# Patient Record
Sex: Female | Born: 1985 | Race: Black or African American | Hispanic: No | Marital: Single | Smoking: Current some day smoker
Health system: Southern US, Community
[De-identification: ages and names within clinical notes are randomized; demographics above are authoritative.]

## PROBLEM LIST (undated history)

## (undated) DIAGNOSIS — Z9889 Other specified postprocedural states: Secondary | ICD-10-CM

---
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
## 2004-03-09 ENCOUNTER — Other Ambulatory Visit: Admission: RE | Admit: 2004-03-09 | Discharge: 2004-03-09 | Payer: Self-pay | Admitting: Obstetrics and Gynecology

## 2004-03-17 ENCOUNTER — Inpatient Hospital Stay (HOSPITAL_COMMUNITY): Admission: AD | Admit: 2004-03-17 | Discharge: 2004-03-20 | Payer: Self-pay | Admitting: Obstetrics and Gynecology

## 2004-03-19 ENCOUNTER — Encounter (INDEPENDENT_AMBULATORY_CARE_PROVIDER_SITE_OTHER): Payer: Self-pay | Admitting: Specialist

## 2004-04-15 ENCOUNTER — Inpatient Hospital Stay (HOSPITAL_COMMUNITY): Admission: AD | Admit: 2004-04-15 | Discharge: 2004-04-15 | Payer: Self-pay | Admitting: Obstetrics and Gynecology

## 2004-04-20 ENCOUNTER — Ambulatory Visit (HOSPITAL_COMMUNITY): Admission: RE | Admit: 2004-04-20 | Discharge: 2004-04-20 | Payer: Self-pay | Admitting: Obstetrics and Gynecology

## 2004-08-09 ENCOUNTER — Emergency Department (HOSPITAL_COMMUNITY): Admission: EM | Admit: 2004-08-09 | Discharge: 2004-08-09 | Payer: Self-pay | Admitting: Family Medicine

## 2004-11-20 ENCOUNTER — Emergency Department (HOSPITAL_COMMUNITY): Admission: EM | Admit: 2004-11-20 | Discharge: 2004-11-20 | Payer: Self-pay | Admitting: Emergency Medicine

## 2004-12-20 ENCOUNTER — Inpatient Hospital Stay (HOSPITAL_COMMUNITY): Admission: AD | Admit: 2004-12-20 | Discharge: 2004-12-20 | Payer: Self-pay | Admitting: Obstetrics and Gynecology

## 2005-05-02 ENCOUNTER — Other Ambulatory Visit: Admission: RE | Admit: 2005-05-02 | Discharge: 2005-05-02 | Payer: Self-pay | Admitting: Obstetrics and Gynecology

## 2005-06-05 ENCOUNTER — Emergency Department (HOSPITAL_COMMUNITY): Admission: EM | Admit: 2005-06-05 | Discharge: 2005-06-05 | Payer: Self-pay | Admitting: Emergency Medicine

## 2005-11-11 ENCOUNTER — Emergency Department (HOSPITAL_COMMUNITY): Admission: EM | Admit: 2005-11-11 | Discharge: 2005-11-11 | Payer: Self-pay | Admitting: Emergency Medicine

## 2005-12-19 ENCOUNTER — Inpatient Hospital Stay (HOSPITAL_COMMUNITY): Admission: AD | Admit: 2005-12-19 | Discharge: 2005-12-19 | Payer: Self-pay | Admitting: Obstetrics and Gynecology

## 2005-12-28 ENCOUNTER — Other Ambulatory Visit: Admission: RE | Admit: 2005-12-28 | Discharge: 2005-12-28 | Payer: Self-pay | Admitting: Obstetrics and Gynecology

## 2006-02-07 ENCOUNTER — Emergency Department (HOSPITAL_COMMUNITY): Admission: EM | Admit: 2006-02-07 | Discharge: 2006-02-07 | Payer: Self-pay | Admitting: Family Medicine

## 2006-05-10 ENCOUNTER — Other Ambulatory Visit: Admission: RE | Admit: 2006-05-10 | Discharge: 2006-05-10 | Payer: Self-pay | Admitting: Obstetrics and Gynecology

## 2006-05-28 ENCOUNTER — Emergency Department (HOSPITAL_COMMUNITY): Admission: EM | Admit: 2006-05-28 | Discharge: 2006-05-28 | Payer: Self-pay | Admitting: Family Medicine

## 2006-06-20 ENCOUNTER — Emergency Department (HOSPITAL_COMMUNITY): Admission: EM | Admit: 2006-06-20 | Discharge: 2006-06-20 | Payer: Self-pay | Admitting: Family Medicine

## 2006-07-23 HISTORY — PX: TUBAL LIGATION: SHX77

## 2006-10-04 ENCOUNTER — Ambulatory Visit (HOSPITAL_COMMUNITY): Admission: RE | Admit: 2006-10-04 | Discharge: 2006-10-04 | Payer: Self-pay | Admitting: Emergency Medicine

## 2006-11-27 ENCOUNTER — Inpatient Hospital Stay (HOSPITAL_COMMUNITY): Admission: AD | Admit: 2006-11-27 | Discharge: 2006-11-27 | Payer: Self-pay | Admitting: Gynecology

## 2006-11-27 ENCOUNTER — Ambulatory Visit: Payer: Self-pay | Admitting: Obstetrics and Gynecology

## 2006-12-11 ENCOUNTER — Ambulatory Visit: Payer: Self-pay | Admitting: Obstetrics & Gynecology

## 2006-12-11 ENCOUNTER — Ambulatory Visit (HOSPITAL_COMMUNITY): Admission: RE | Admit: 2006-12-11 | Discharge: 2006-12-11 | Payer: Self-pay | Admitting: Family Medicine

## 2006-12-11 ENCOUNTER — Encounter: Payer: Self-pay | Admitting: Obstetrics and Gynecology

## 2006-12-29 ENCOUNTER — Ambulatory Visit: Payer: Self-pay | Admitting: *Deleted

## 2006-12-29 ENCOUNTER — Inpatient Hospital Stay (HOSPITAL_COMMUNITY): Admission: AD | Admit: 2006-12-29 | Discharge: 2006-12-29 | Payer: Self-pay | Admitting: Obstetrics and Gynecology

## 2006-12-30 ENCOUNTER — Ambulatory Visit: Payer: Self-pay | Admitting: *Deleted

## 2006-12-30 ENCOUNTER — Other Ambulatory Visit: Payer: Self-pay | Admitting: Emergency Medicine

## 2006-12-30 ENCOUNTER — Inpatient Hospital Stay (HOSPITAL_COMMUNITY): Admission: AD | Admit: 2006-12-30 | Discharge: 2007-01-07 | Payer: Self-pay | Admitting: Obstetrics and Gynecology

## 2007-01-15 ENCOUNTER — Ambulatory Visit: Payer: Self-pay | Admitting: Obstetrics & Gynecology

## 2007-01-23 ENCOUNTER — Ambulatory Visit: Payer: Self-pay | Admitting: Gynecology

## 2007-01-24 ENCOUNTER — Inpatient Hospital Stay (HOSPITAL_COMMUNITY): Admission: AD | Admit: 2007-01-24 | Discharge: 2007-01-25 | Payer: Self-pay | Admitting: Obstetrics & Gynecology

## 2007-01-24 ENCOUNTER — Ambulatory Visit: Payer: Self-pay | Admitting: Physician Assistant

## 2007-01-26 ENCOUNTER — Inpatient Hospital Stay (HOSPITAL_COMMUNITY): Admission: AD | Admit: 2007-01-26 | Discharge: 2007-01-26 | Payer: Self-pay | Admitting: Obstetrics & Gynecology

## 2007-01-29 ENCOUNTER — Ambulatory Visit: Payer: Self-pay | Admitting: Obstetrics & Gynecology

## 2007-02-19 ENCOUNTER — Ambulatory Visit: Payer: Self-pay | Admitting: Obstetrics & Gynecology

## 2007-03-13 ENCOUNTER — Inpatient Hospital Stay (HOSPITAL_COMMUNITY): Admission: AD | Admit: 2007-03-13 | Discharge: 2007-03-13 | Payer: Self-pay | Admitting: Obstetrics and Gynecology

## 2007-03-13 ENCOUNTER — Ambulatory Visit: Payer: Self-pay | Admitting: Obstetrics and Gynecology

## 2007-04-02 ENCOUNTER — Ambulatory Visit: Payer: Self-pay | Admitting: *Deleted

## 2007-04-05 ENCOUNTER — Inpatient Hospital Stay (HOSPITAL_COMMUNITY): Admission: AD | Admit: 2007-04-05 | Discharge: 2007-04-07 | Payer: Self-pay | Admitting: Gynecology

## 2007-04-05 ENCOUNTER — Ambulatory Visit: Payer: Self-pay | Admitting: Gynecology

## 2007-12-31 ENCOUNTER — Emergency Department (HOSPITAL_COMMUNITY): Admission: EM | Admit: 2007-12-31 | Discharge: 2007-12-31 | Payer: Self-pay | Admitting: Emergency Medicine

## 2008-03-31 ENCOUNTER — Ambulatory Visit: Payer: Self-pay | Admitting: Obstetrics and Gynecology

## 2008-03-31 ENCOUNTER — Encounter: Payer: Self-pay | Admitting: Obstetrics and Gynecology

## 2008-06-28 ENCOUNTER — Emergency Department (HOSPITAL_COMMUNITY): Admission: EM | Admit: 2008-06-28 | Discharge: 2008-06-28 | Payer: Self-pay | Admitting: Family Medicine

## 2009-04-14 ENCOUNTER — Emergency Department (HOSPITAL_COMMUNITY): Admission: EM | Admit: 2009-04-14 | Discharge: 2009-04-14 | Payer: Self-pay | Admitting: Family Medicine

## 2009-09-30 ENCOUNTER — Emergency Department (HOSPITAL_COMMUNITY): Admission: EM | Admit: 2009-09-30 | Discharge: 2009-09-30 | Payer: Self-pay | Admitting: Family Medicine

## 2009-12-20 ENCOUNTER — Emergency Department (HOSPITAL_COMMUNITY): Admission: EM | Admit: 2009-12-20 | Discharge: 2009-12-20 | Payer: Self-pay | Admitting: Family Medicine

## 2010-08-13 ENCOUNTER — Encounter: Payer: Self-pay | Admitting: Emergency Medicine

## 2010-10-09 LAB — POCT URINALYSIS DIP (DEVICE)
Glucose, UA: NEGATIVE mg/dL
Nitrite: NEGATIVE
Protein, ur: 100 mg/dL — AB
Specific Gravity, Urine: 1.025 (ref 1.005–1.030)
Urobilinogen, UA: 2 mg/dL — ABNORMAL HIGH (ref 0.0–1.0)
pH: 7 (ref 5.0–8.0)

## 2010-12-05 NOTE — Op Note (Signed)
[name], [name]               ACCOUNT NO.:  [id number]   MEDICAL RECORD NO.:  [id number]           PATIENT TYPE:   LOCATION:                                [facility]:  [facility]   PHYSICIAN:  Allie Bossier, MD        DATE OF BIRTH:  1985/09/09   DATE OF PROCEDURE:  04/06/2007  DATE OF DISCHARGE:                               OPERATIVE REPORT   PREOPERATIVE DIAGNOSIS:  Multiparity, desires sterility.   POSTOPERATIVE DIAGNOSIS:  Multiparity, desires sterility.   PROCEDURE:  Application of Filshie clips to oviducts.   SURGEON:  Clarisa Kindred, MD   ANESTHESIA:  Epidural, Cristela Blue, MD   COMPLICATIONS:  None.   ESTIMATED BLOOD LOSS:  Minimal.   SPECIMENS:  None.   DETAILED PROCEDURE AND FINDINGS:  The risks, benefits, alternatives of  surgery were explained, understood, accepted.  Consents were signed.  She understands the 1% failure rate of the surgery and wishes to  proceed.  In the operating room her epidural was bolused for surgery.  Her abdomen was prepped and draped usual sterile fashion.  Adequate  anesthesia was assured.  Approximately 4 mL of 0.5% Marcaine was  injected in the umbilicus and I took care to look and see a natural  crease in her umbilicus and made an incision at this site.  The fascia  was elevated with Kocher clamps and incised with Mayo scissors.  The  peritoneum was entered with hemostats.  Using [employer] retractors the  oviduct on her right was visualized.  It was grasped with Babcock clamp  and traced to the fimbriated end.  It was then retraced to the isthmic  region where a Filshie clip was placed across the entire oviduct  approximately 1 mL of 0.5% Marcaine was injected into the oviduct near  the clip for postop pain relief.  A repeat procedure was performed on  the left side again the fimbriated ends were visualized and the clip was  placed in the isthmic region and 1 mL of 0.5% Marcaine was injected into  the tube.  The tubes were allowed to fall  back in the abdominal cavity.  The fascia was elevated with Allis clamps and closed with a 0 Vicryl  running nonlocking suture.  A subcuticular closure was done with 4-0  Vicryl suture.  Instrument, sponge and counts were correct.  She  tolerated the procedure well and was taken to recovery room in stable  condition with the instrument, sponge and needle counts correct.      Allie Bossier, MD  Electronically Signed    MCD/MEDQ  D:  04/06/2007  T:  04/07/2007  Job:  [id number]

## 2010-12-05 NOTE — Group Therapy Note (Signed)
NAMEJOBETH, PANGILINAN NO.:  [id number]   MEDICAL RECORD NO.:  [id number]          PATIENT TYPE:  WOC   LOCATION:  [facility]                   FACILITY:  [facility]   PHYSICIAN:  Argentina Donovan, MD        DATE OF BIRTH:  03-04-1986   DATE OF SERVICE:  03/31/2008                                  CLINIC NOTE   The patient is a 25 year old African American female, gravida 4, para 2-  2-0-3 who had her last Pap smear a year ago that turned out to be a high-  grade squamous intraepithelial lesion.  The patient was contacted  several times to come back after that visit and was sent several  certified letters, but never came back until today.   PHYSICAL EXAMINATION:  EXTERNAL GENITALIA:  Normal.  The introitus was  marital.  The BUS was normal.  Vagina is clean and well rugated.  The  cervix was parous with a marked amount of inflammation especially on the  anterior lip.  The uterus was of normal size, shape, and consistency and  adnexa was normal.  ABDOMEN:  Soft, flat, and nontender.  No masses or organomegaly.   The Pap smear will be sent for evaluation of gonorrhea and chlamydia as  well as cytology.  I told the patient that it is most likely she is  going to end up needing a colposcopy and was described in detail what  that meant.  She is a smoker, smokes more than a pack of cigarettes a  day, although she says she is trying to stop.   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  Ending Pap smear and probably  needs colposcopy.            ______________________________  Argentina Donovan, MD     PR/MEDQ  D:  03/31/2008  T:  04/01/2008  Job:  [id number]

## 2010-12-05 NOTE — Discharge Summary (Signed)
[name], [name]               ACCOUNT NO.:  [id number]   MEDICAL RECORD NO.:  [id number]          PATIENT TYPE:  INP   LOCATION:  9305                          [facility]ILITY:  [facility]   PHYSICIAN:  Phil D. Okey Dupre, M.D.     DATE OF BIRTH:  [birth year]   DATE OF ADMISSION:  12/30/2006  DATE OF DISCHARGE:  01/07/2007                               DISCHARGE SUMMARY   ADMISSION DIAGNOSES:  1. Intrauterine pregnancy at 25+ weeks' gestation.  2. Hyperemesis in pregnancy.   DISCHARGE DIAGNOSES:  1. Intrauterine pregnancy at __________ weeks.  2. Hyperemesis.  3. Hypopotassemia.  4. Constipation.  5. Anemia/  6. Hypophosphatemia.   PERTINENT LABS:  Urinalysis on admission:  Specific gravity 1.030,  greater than 80 of ketones, 30 of protein.  Wet prep was negative.  H.  pylori antibody was negative.  Prealbumin was 13.1, which was low.  Ferritin was normal at 33.  TSH was 0.356, which was normal.  Liver  function tests were normal.  Amylase was mildly elevated at 156.  Lipase  was normal at 19.  Potassium was 2.8, which was repleted, and discharge  potassium was 4.4.  Hemoglobin was 8.8 on admission and at the time of  discharge, hemoglobin was 9.6.  Phosphorus was 1.5 on early admission,  which got repleted and was 4.7 at the time of discharge.   STUDIES:  1. OB ultrasound done June 14 showed baby in cephalic position, AFI      normal, biophysical profile of 8/8, cervix 4 cm.  2. Abdominal ultrasound done June 11 showed no evidence of gallstones      or acute cholecystitis.   CONSULTATIONS:  1. Gastroenterology.  2. Nutrition.   HOSPITAL COURSE:  This is a 25 year old gravida 4, para 1-2-0-2, at 25  weeks 2 days' gestation, who presented with complaints of severe nausea  and vomiting x3 days.  The patient reports unable to hold anything down  for 2 days prior to admission.  Had several MAU visits in the 2 days  prior to admission.  She does have a history of nausea/vomiting, early  in  pregnancy but for the 3 weeks prior to admission she had been  completely fine with sudden onset of symptoms once again a couple of  days prior to admission.  On admission she was found to be very  dehydrated.  She was admitted for IV hydration and for the hyperemesis.  she was given several for fluid boluses, continued on IV fluids, was  placed on standing antiemetic regimen which included Zofran, Phenergan  and Reglan, and was kept n.p.o. for these 24 hours.  The patient still  continued with profuse nausea and vomiting despite standing antiemetics.  She was started on a steroid taper and a proton pump inhibitor, namely  Protonix, was added to her regimen.  TSH H. pylori was done, which were  both negative.  While here in the hospital the patient complained of  abdominal pain, primarily epigastric and some lower abdominal pain.  There was concern with the nausea and vomiting whether the patient may  have  had gallstones so an abdominal ultrasound was done, which showed no  evidence of gallstones or biliary disease.  Her liver function tests  were normal.  Amylase was mildly elevated but it unclear whether or not  the patient was spitting a lot, which could cause an increase in  amylase.  Lipase was negative.  She was placed on the monitor given that  she was complaining of abdominal pain.  There were no contractions noted  on the monitor.  Her cervix was checked and found to be stable at 1 cm  dilated, long, and fetal position was high.  After a couple days in the  hospital with no significant improvement in the patient's clinical  picture, GI was consulted.  They saw and evaluated the patient but had  nothing further to add to her regimen or her management.  The patient  was continued on the IV fluids, antiemetics, and Unisom 12.5 mg b.i.d.  was added including a scopolamine patch.  During the hospital stay the  patient required getting a more suitable IV access because her blood  draw  sticks were becoming increasingly difficult and given that the  patient remained not tolerating much p.o., IV access was  essential.  A  PICC line was placed and shortly thereafter, TPN was started.  The  patient gradually started feeling a little bit better.  Her emesis  started slowing down.  She got to the point where she was feeling very  hungry and had her family members bring her food from the outside, which  included things like [employer] and [employer], which per the patient  report she tolerated well.  At this point her TPN was discontinued and  she was felt to be stable for discharge home with follow-up by home  health nurse for IV hydration therapy.   She was discharged on the following medications:  1. Unisom 12.5 mg p.o. b.i.d.  2. Scopolamine patch to be changed q.72h.  3. Protonix 40 mg p.o. daily.  4. Phenergan 25 mg q.6h. as needed for nausea.  5. Zofran 8 mg q.8h. as needed for nausea.  6. Reglan 10 mg p.o. q.6h.  7. Steroid taper.   Her follow-up instructions were to follow up at the low-risk clinic.  An  appointment was made to her prior to discharge for June 27.  She is to  follow a low-fat diet and to increase her protein intake.     ______________________________  [name], MD      Phil D. Okey Dupre, M.D.  Electronically Signed    LNJ/MEDQ  D:  03/07/2007  T:  03/08/2007  Job:  [id number]

## 2010-12-05 NOTE — Consult Note (Signed)
[name], [name]               ACCOUNT NO.:  [id number]   MEDICAL RECORD NO.:  [id number]          PATIENT TYPE:  INP   LOCATION:  9305                          [facility]   PHYSICIAN:  [name], M.D.   DATE OF BIRTH:  01-17-1986   DATE OF CONSULTATION:  01/01/2007  DATE OF DISCHARGE:                                 CONSULTATION   We were asked to see [name] today in consult for hyperemesis by Dr.  Argentina [name]. Today's date is January 01, 2007.   HISTORY OF PRESENT ILLNESS:  This is a 25 year old female who is 6  months pregnant.  She reports vomiting that started this past Sunday.  She states that she was vomiting blood and bowel.  She describes it as  green-brown fluid. She is negative for a bowel movement in the least 6  days.  She cannot tell me when her last bowel movement was. She denies  melena, hematochezia.  She is negative for any abdominal pain but says  that she has soreness due to vomiting.  She is not taking NSAIDs and has  had no new medications prior to admission. She states that she has no  previous history of these symptoms with previous pregnancies or other GI  ailments such as GERD or any bowel disease. She states that only  Phenergan helps her current symptoms.  The patient is not very  communicative at this point as she feels very ill.   PAST MEDICAL HISTORY:  Significant for two vaginal births. No surgeries.  No diabetes or hypertension or other adult illnesses.   CURRENT MEDICATIONS:  Are none including no prenatal vitamins.   ALLERGIES:  No known drug allergies.   FAMILY HISTORY:  Is unknown.   SOCIAL HISTORY:  Positive for tobacco.  She has a history of drug use,  does not drink alcohol.   PHYSICAL EXAM:  GENERAL:  She is awake and appropriate but appears  tired, very nauseated and ill feeling.  HEART:  Regular rate and rhythm.  LUNGS:  Clear.  ABDOMEN:  Soft, nontender with no appreciable bowel sounds.  No  appreciable  hepatomegaly.  RECTAL:  She has no hemorrhoids.  No stool in her rectal vault. The  small sample of light brown fluid that was collected was guaiac  negative.  Her eyes are anicteric. Her skin is without rash or lesions.  VITAL SIGNS:  Temperature 98.3, pulse 84, respirations are 20, blood  pressure is 117/63.  Her H.  Pylori antibody is negative.  Potassium is  currently 3.4, white count is 3.2, hemoglobin 9.2, hematocrit 27.3,  platelets are 165,000, lipase of 19, amylase of 156.  TSH of 0.356. On  wet prep the patient had many bacteria seen, some white blood cells  seen, small amount of bilirubin in her urine. Ultrasound of her abdomen  was negative for gallstones or cholecystitis.   ASSESSMENT:  Dr. Molly Maduro [name] has seen the patient, examined her, and  collected a history. He notes that:  1. She is hypokalemic.  2. She is likely constipated.   Differential diagnosis for  severe vomiting in this case could include  subclinical hyperthyroidism, abnormal gastric motility, conversion or  somatization. In light of the normal lipase and elevated amylase is less  concerning for pancreatitis given normal blood pressure, normal LFTs,  not highly suspicious of preeclampsia or HELLP syndrome. Unfortunately  we can not add any further diagnostic or therapeutic recommendations. We  will refer to customary approach for TNA. If the patient can tolerate  clear liquids MiraLax may help with her irregular bowel movements.  Thank you very much for this consultation.      Nichole Police, PA    ______________________________  [name], M.D.    MLY/MEDQ  D:  01/01/2007  T:  01/01/2007  Job:  [id number]   cc:   [name], M.D.

## 2010-12-08 NOTE — H&P (Signed)
[name], [name]                         ACCOUNT NO.:  [id number]   MEDICAL RECORD NO.:  [id number]                   PATIENT TYPE:  INP   LOCATION:  9158                                 [facility]ILITY:  [facility]   PHYSICIAN:  Hal Morales, M.D.             DATE OF BIRTH:  1986/06/06   DATE OF ADMISSION:  03/17/2004  DATE OF DISCHARGE:                                HISTORY & PHYSICAL   HISTORY OF PRESENT ILLNESS:  This is an 25 year old, gravida 3, para 1-1-0-  2, at 48 and 2/7ths weeks, who presents with complaints of gush of fluid at  1600 hours.  She denies bleeding or pain.  She was seen for new OB 2 weeks  ago, with an ultrasound about 1 weeks ago.  Records are currently  unavailable.   OBSTETRICAL HISTORY:  The patient had a 36 week vaginal delivery with her  first baby, remarkable for preterm labor.  She had a term delivery of her  second baby with no preterm labor per patient report.   MEDICAL HISTORY:  Unremarkable.   ALLERGIES:  None.   SURGICAL HISTORY:  None.   FAMILY HISTORY:  Noncontributory.   GENETIC HISTORY:  Negative.   SOCIAL HISTORY:  The patient is accompanied by her boyfriend and her mother,  who are both involved and supportive.  She does not report a religious  affiliation.  She denies any alcohol, tobacco, or drug use.   PRENATAL LABORATORIES:  Unavailable.   OBJECTIVE DATA:  VITAL SIGNS:  Stable.  Temperature 98.6.  HEENT:  Within normal limits.  Thyroid normal, not enlarged.  CHEST:  Clear to auscultation.  HEART:  Regular rate and rhythm.  ABDOMEN:  Gravid at 20 cm.  Fetal heart tones 147.  PELVIC:  Sterile speculum exam reveals positive pooling, positive nitrazine,  positive fern.  Wet prep shows rare WBC's and rare bacteria, but no clue and  no yeast.  GC and Chlamydia are pending.  Group B strep is pending.  The  cervix is closed, long, -3.  CBC is pending.  UA is pending.   ASSESSMENT:  1. Intrauterine pregnancy at 20 and 2/7ths weeks.  2. Preterm premature rupture of membranes.   PLAN:  1. Per Dr. Pennie Rushing, admit to antenatal.  2. Complete OB ultrasound.  3. Issues were discussed with the patient, boyfriend, and mother.  I briefly     outlined her options.  The patient expresses desire to try to keep the     baby as long as long as possible.  Dr. Pennie Rushing will see her later for     further discussion.     Marie L. Williams, C.N.M.                 Hal Morales, M.D.    MLW/MEDQ  D:  03/17/2004  T:  03/18/2004  Job:  [id number]

## 2010-12-08 NOTE — H&P (Signed)
[facility]  Patient:    [name], [name]                      MRN: [id number] Adm. Date:  [id number] Attending:  Shaune Spittle Dictator:   Vance Gather Duplantis, C.N.M.                         History and Physical  REDICTATION  HISTORY OF PRESENT ILLNESS:   [name] is a 25 year old single black female, gravida 1, para 0, at 36-3/7 weeks by LMP and 37-1/7 by 18-week ultrasound, who presents complaining of uterine contractions of increasing intensity every 3 minutes throughout the night.  She also reports that her nausea and vomiting that she was treated for yesterday evening with IV fluids has returned and she has continued to vomit throughout the night.  Her cervix yesterday evening was 4 cm, 95%, vertex -1 with intact membrane.  She denies any headache or visual disturbances.  She is requesting something for pain, either IV or epidural. Her pregnancy has been followed at [facility] by the certified nurse midwife service and has been at risk for:  #1 - Being an adolescent, and #2 - prolonged prodromal labor and previous preterm uterine contractions.  OBSTETRICAL/GYNECOLOGICAL HISTORY:  She is a gravida 1, para 0 with an LMP of May 07, 2000.  She has no other GYN issues.  ALLERGIES:                    She has no known drug allergies.  GENERAL MEDICAL HISTORY:      She reports having had the usual childhood diseases.  She reports a history of asthma as a child.  FAMILY HISTORY:               Her family history is significant for maternal grandmother with hypertension, on medication, sister with asthma, family history of diabetes and family history of alcoholism and smoking.  GENETIC HISTORY:              Benign.  SOCIAL HISTORY:               She is single.  She lives with her mom, who is involved and supportive, and the father of the baby is Mitchel Honour, who is also involved and supportive.  They are of the [employer]. They deny any illicit drug use, alcohol or smoking with this pregnancy.  PRENATAL LABORATORY DATA:     Her blood type is O-positive.  Her antibody screen is negative.  Sickle cell trait is negative.  Syphilis is nonreactive. Rubella is immune.  Hepatitis B surface antigen is negative.  HIV is nonreactive.  GI and Chlamydia are both negative.  Pap was within normal limits.  One-hour glucola was within normal range and maternal serum alpha-fetoprotein was also within normal range and her 36-week beta strep was negative.  PHYSICAL EXAMINATION:  VITAL SIGNS:                  Her vital signs are stable with a temperature of 100.7.  HEENT:                        Grossly within normal limits.  HEART:                        Regular rhythm and  rate.  CHEST:                        Clear.  BREASTS:                      Soft and nontender.  ABDOMEN:                      Gravid with uterine contractions noted every two to three minutes.  Her fetal heart rate is in the 150s to 155s, initially with no significant accelerations and some possible late decelerations but currently reactive and reassuring.  PELVIC:                       Her cervix on admission was 6 cm, 100%, vertex -1 with bulging membranes.  EXTREMITIES:                  Within normal limits.  ASSESSMENT:                   1. Intrauterine pregnancy at 36-3/7 weeks.                               2. Active labor.                               3. Maternal temperature.                               4. Desiring epidural.  PLAN:                         Her plan is to admit to labor and delivery per consult with Dr. Janine Limbo, to give her IV fluid bolus, give her penicillin for her temperature, to plan epidural and then AROM and watch her fetal heart rate closely. DD:  01/17/01 TD:  01/17/01 Job: [id number]

## 2010-12-08 NOTE — H&P (Signed)
[facility]  Patient:    [name], [name]                      MRN: [id number] Adm. Date:  [id number] Attending:  Shaune Spittle Dictator:   Nigel Bridgeman, C.N.M.                         History and Physical  HISTORY OF PRESENT ILLNESS:   [name] is a 25 year old gravida 1, para 0 at 35-2/7 weeks who presented with increased uterine contractions and vomiting since 5 p.m.  She was seen in Maternity Admissions Unit earlier today with cervix 2 to 3 cm, frequent uterine contractions and dehydration and vomiting. She received one bag of IV fluid and terbutaline subcu with decreased uterine contractions, at which time she was discharged home.  She reports the contractions began again approximately 5 p.m., with two episodes of vomiting since.  Pregnancy has been remarkable for:  #1 - Age 25, #2 - previous smoker, #3 - mild anemia, #4 - oligohydramnios with increased fluid on followup ultrasound last week.  PRENATAL LABORATORY DATA:     Blood type is O-positive.  Rh-antibody negative. VDRL nonreactive.  Rubella titer positive.  Hepatitis B surface antigen negative.  HIV nonreactive.  Sickle cell test negative.  GC and Chlamydia cultures were negative.  Pap was normal.  Glucose challenge was normal.  AFP was normal.  Hemoglobin upon entry into practice was 12.3; it was 10.8 at 26 weeks.  EDC of February 11, 2001 was established by last menstrual period and was in agreement with ultrasound at approximately 18 weeks.  HISTORY OF PRESENT PREGNANCY:  Patient entered care at 10 weeks.  She had low weight gain during her pregnancy.  She had some nausea and vomiting in early pregnancy and she was treated with IV fluids.  She had an ultrasound at approximately 32 weeks that showed decreased fluid.  She had a followup ultrasound last week which showed normal fluid and normal growth.  OBSTETRICAL HISTORY:          Patient is a primigravida.  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  She has a history of migraines.  ALLERGIES:                    None.  FAMILY HISTORY:               Her mother had a miscarriage.  Her maternal grandmother is hypertensive, on medication.  Her sister has asthma.  There is a family history of diabetes on her mothers side.  There is a family history of alcoholism and smoking.  GENETIC HISTORY:              Remarkable for the maternal second cousin with sickle cell disease.  SOCIAL HISTORY:               Patient is single.  The father of the baby is involved and supportive.  His name is [name].  Patient also is supported by her mother, who is also present with her.  She is African-American and of the [employer].  She is an eighth Tax adviser.  Her partner has an 11th grade education.  He is employed at a [employer].  She has been followed by  the certified nurse midwife service at [facility].  She denies any alcohol or drug use during this pregnancy.  She was a smoker but stopped with her positive UPT.  PHYSICAL EXAMINATION:  VITAL SIGNS:                  Stable.  Patient is afebrile.  HEENT:                        Within normal limits.  LUNGS:                        Bilateral breath sounds are clear.  HEART:                        Regular rate and rhythm without murmur.  BREASTS:                      Soft and nontender.  ABDOMEN:                      Fundal height is approximately 36 cm.  Estimated fetal weight is 5 to 5-1/2 pounds.  Uterine contractions are every three to four minutes, moderate quality.  PELVIC:                       Cervical exam 5 cm, 100%, vertex at a -1 to a 0 station with slightly bulging bag of water.  Cervix is slightly posterior. Fetal heart rate is reactive with no decelerations.  There is a negative spontaneous CST.  GU:                           Clean-catch urine shows 40 of  ketones, small hemoglobin, trace leukocyte esterase.  EXTREMITIES:                  Deep tendon reflexes are 2+ without clonus. There is no significant edema.  IMPRESSION:                   1. Intrauterine pregnancy at 35-2/7 weeks.                               2. Early labor.                               3. Age 25.  PLAN:                         1. Admit to birthing suite per consult with                                  Dr. Maris Berger. Haygood as attending                                  physician.                               2. Routine certified nurse midwife orders.  3. Will defer any tocolysis and will manage as                                  labor.                               4. Plan for group B strep prophylaxis with                                  penicillin G per standard dosing.                               5. Anticipate Stadol for pain medication or an                                  epidural. DD:  01/10/01 TD:  01/10/01 Job: [id number]

## 2010-12-08 NOTE — H&P (Signed)
[facility]  Patient:    [name], [name]                      MRN: [id number] Adm. Date:  [id number] Attending:  Shaune Spittle Dictator:   Vance Gather Duplantis, C.N.M.                         History and Physical  HISTORY OF PRESENT ILLNESS:   [name] is a 25 year old single black female, gravida 1, para 0, at 36-3/7 weeks by LMP and 37-1/7 weeks by ultrasound, who presents complaining of uterine contractions of increasing intensity every three minutes and return of nausea and vomiting throughout the night.  She was evaluated yesterday evening in Maternity Admissions and received two bags of IV fluids and did have her nausea and vomiting at that time improve and went home, having taken an Ambien for rest.  She was contracting at that time every three to five minutes and her cervix was still 4 cm, 95%, vertex at a -1 and intact membranes.  She currently denies any headache of visual disturbances. She is requesting something for labor pain, either IV or epidural.  Her pregnancy has been followed at [facility] by the certified nurse midwife service and has been at risk for:  #1 - History of being an adolescent and #2 - prolonged prodromal labor with preterm uterine contractions.  OBSTETRICAL/GYNECOLOGICAL HISTORY:  She is a primigravida with a certain LMP of May 07, 2000, giving her an [facility] of February 11, 2001, confirmed by 18-week ultrasound.  She has no other gynecological problems.  ALLERGIES:                    She has no known drug allergies.  GENERAL MEDICAL HISTORY:      She reports having had the usual childhood diseases.  She reports a history of asthma as a child.  GENETIC HISTORY:              Her genetic history is significant only for a maternal cousin with sickle cell disease.  FAMILY HISTORY:               Family history otherwise is significant for maternal grandmother with hypertension, on medications, and  family history of diabetes on the maternal side.  SOCIAL HISTORY:               She is single.  She lives with her mom, [name], who is involved and very supportive.  The father of the baby is also involved and supportive.  She is a Physicist, medical.  The father of the baby is employed full-time at [employer].  They deny any illicit drug use, alcohol or smoking throughout this pregnancy.  PRENATAL LABORATORY DATA:     Her blood type is O-positive.  An antibody screen is negative.  Sickle cell trait is negative.  Syphilis is nonreactive. Rubella is immune.  Hepatitis B surface antigen is negative.  HIV is nonreactive.  GC and Chlamydia are both negative.  Pap is within normal limits.  Her one-hour glucola is 97 and her maternal serum alpha-fetoprotein was within normal range.  Her beta strep was collected last week but the results are currently not available.  DD:  01/17/01 TD:  01/17/01 Job: [id number]

## 2010-12-08 NOTE — Discharge Summary (Signed)
[name], [name]                         ACCOUNT NO.:  [id number]   MEDICAL RECORD NO.:  [id number]                   PATIENT TYPE:  INP   LOCATION:  9306                                 FACILITY:  [facility]   PHYSICIAN:  Janine Limbo, M.D.            DATE OF BIRTH:  14-Nov-1985   DATE OF ADMISSION:  03/17/2004  DATE OF DISCHARGE:  03/20/2004                                 DISCHARGE SUMMARY   ADMITTING DIAGNOSES:  1. Intrauterine pregnancy at 20 and two-sevenths weeks.  2. Preterm premature rupture of membranes.   DISCHARGE DIAGNOSES:  1. Intrauterine pregnancy at 20 weeks.  2. Premature prolonged rupture of membranes.  3. History of positive group B streptococcus last pregnancy.  4. Postpartum anemia.  5. Post delivery fever, controlled with antibiotics.   PROCEDURES:  1. Spontaneous vaginal birth precipitously over an intact perineum.  2. Epidural anesthesia.   HOSPITAL COURSE:  [name] is an 25 year old gravida 3 para 1-1-0-2 at 32  and two-sevenths weeks who was admitted on March 17, 2004 with spontaneous  rupture of membranes.  Cervix was closed at that time.  CBC was normal.  UA  was normal.  Fetal heart tones were 147 and rupture of membranes was  confirmed.  The patient was having minimal contractions at that time.  She  was placed on antibiotics and bedrest.  She began to have more pain the  morning of the following day.  Pain medication was given through the day,  then the patient progressed rapidly to precipitous vaginal delivery of a  nonviable female by the name of [name], weight 14.4 ounces, Apgars  were 1 and 1.  The infant did expire subsequent to delivery.  The patient  had epidural anesthesia.  Estimated blood loss was less than 500 mL.  Placenta was delivered spontaneously and intact by Dr. Pennie Rushing.  Case  management was involved subsequent to the patient's delivery.  Funeral  services were arranged with community funeral services.  By  postpartum day  #1 the patient was up ad lib.  She was very quiet, grieving appropriately.  She did have significant family support.  Her temperature was 100.6 at 2  a.m. on March 18, 2004.  Hemoglobin was 8.6, white blood cell count was  15.3.  She was placed on Unasyn and iron.  By postpartum day #2 she had been  afebrile for greater than 24 hours.  She then converted to p.o. Augmentin.  Her physical exam was within normal limits.  She was coping with her loss  with good family support.  The decision was made to discharge her home since  she had received the full benefit of her hospital stay.   DISCHARGE INSTRUCTIONS:  Per routine postpartum instructions.  Signs and  symptoms of endometritis or fever were reviewed with the patient.  Signs and  symptoms of postpartum depression were also reviewed.   DISCHARGE MEDICATIONS:  1.  Motrin 600 mg p.o. q.6h. p.r.n. pain.  2. Tylox one to two p.o. q.3-4h. p.r.n. pain.  3. Apri birth control pill one p.o. daily to start on April 02, 2004.  4. Hemocyte one p.o. daily.  5. Augmentin 500 mg one p.o. t.i.d. x6 days.  The patient had completed 1     day of therapy already.   Discharge follow-up will occur in 2-and-a-half weeks at [facility]  on April 05, 2004 for follow-up.  The patient's return to school will be  anticipated after that.     Renaldo Reel Emilee Hero, C.N.M.                   Janine Limbo, M.D.    Leeanne Mannan  D:  03/20/2004  T:  03/20/2004  Job:  [id number]

## 2010-12-08 NOTE — H&P (Signed)
[name], [name]                         ACCOUNT NO.:  [id number]   MEDICAL RECORD NO.:  [id number]                   PATIENT TYPE:  INP   LOCATION:  9164                                 FACILITY:  [facility]   PHYSICIAN:  Janine Limbo, M.D.            DATE OF BIRTH:  11-19-1985   DATE OF ADMISSION:  05/16/2003  DATE OF DISCHARGE:                                HISTORY & PHYSICAL   HISTORY OF PRESENT ILLNESS:  [name] is a 25 year old single black female,  G2, P1-0-0-1, at 50 and 3/7ths weeks who presents with regular uterine  contractions this evening.  She denies leaking, bleeding, headache, nausea,  vomiting, or visual disturbances.  Her pregnancy has been followed by the  [facility] Certified Nurse Midwife Service, and has been  remarkable for (1) history of asthma, (2) age 70, (3) social issues, (4)  questionable last menstrual period, (5) conception on OCP's, (6) group B  strep positive.   PRENATAL LABORATORIES:  Labs collected on October 05, 2002 revealed hemoglobin  of 11.7, hematocrit 35.5, platelets 242,000.  Blood type O positive,  antibody negative, RPR nonreactive, rubella immune, hepatitis B surface  antigen negative.  Pap smear within normal limits.  Gonorrhea negative,  Chlamydia negative.  Cystic fibrosis negative.  On Nov 30, 2002, her quad  screen was within normal limits.  Her one-hour Glucola on February 16, 2003 was  97, and her RPR was nonreactive at that time.  Culture of the vaginal tract  for group B strep, gonorrhea, Chlamydia, on April 15, 2003 was negative  for the gonorrhea and Chlamydia, and positive for group B strep.   HISTORY OF PRESENT PREGNANCY:  She presented for care at [facility] on  October 05, 2002 at approximately [redacted] weeks gestation.  She had pregnancy  ultrasonography at that first visit.  Second pregnancy ultrasonography at [redacted]  weeks gestation showed growth consistent with previous dating, and no other  concerns.  At  [redacted] weeks gestation, she was treated for a urinary tract  infection.  She started the [facility] at [redacted] weeks gestation.  The  rest of her prenatal care was unremarkable.   OBSTETRIC HISTORY:  She is a gravida 2, para 1-0-0-1.  In June of 2002, she  vaginally delivered a female infant at 36.[redacted] weeks gestation after five hours  of labor.  The infant weighed 6 pounds and 15 ounces.  She had an epidural  for anesthesia.  She had a fever during labor.  The infant's name is [name].   MEDICAL HISTORY:  No medication allergies.  She conceived this pregnancy on  oral contraception.  She reports having had the usual childhood illnesses.  She has a history of bronchitis-induced asthma as a child.  The patient  smokes 1-3 cigarettes a day.   FAMILY MEDICAL HISTORY:  Maternal grandmother with history of hypertension.  The patient's sister with asthma.  FAMILY HISTORY:  Diabetes on the mother's side.  Mother with a history of  migraines.  Family history of alcoholism and smoking.   GENETIC HISTORY:  Remarkable for maternal second cousin with sickle cell  disease, and father of the baby's family with questionable sickle cell  trait.   SOCIAL HISTORY:  Father of the baby is involved in the pregnancy.  He was  present at multiple prenatal visits.  The patient is of the [employer].  She is a Consulting civil engineer.  The patient had some social issues in  late December or January.  She ran away from home.  There was a death in the  family which she was very upset about.  Father of the baby is not approved  of by the mother, but may be involved with the pregnancy if he desires.  The  patient denies any alcohol or illicit drug use with the pregnancy.   OBJECTIVE DATA:  VITAL SIGNS:  Stable.  She is afebrile.  HEENT:  Grossly within normal limits.  CHEST:  Clear to auscultation.  HEART:  Regular rate and rhythm.  ABDOMEN:  Gravid and contoured with fundal height extending approximately 39  cm  above the pubic symphysis.  Fetal heart rate is reassuring with positive  accelerations and no decelerations.  Uterine contractions every three  minutes.  PELVIC:  Cervix is 4 cm, 90%, vertex, with bulging bag of water per R.N.  exam.  EXTREMITIES:  Within normal limits.   ASSESSMENT:  1. Intrauterine pregnancy at term.  2. Early active labor.  3. Group B strep positive.   PLAN:  1. Admit to birthing suites for a consult with Dr. Stefano Gaul.  2. Routine CNM orders.  3. Plan penicillin G for group B strep.  4. The patient plans epidural for labor.     Cam Hai, C.N.M.                     Janine Limbo, M.D.    KS/MEDQ  D:  05/16/2003  T:  05/16/2003  Job:  [id number]

## 2011-05-04 LAB — CBC
HCT: 31.2 — ABNORMAL LOW
Hemoglobin: 10.8 — ABNORMAL LOW
MCHC: 34.2
MCHC: 34.5
MCV: 82.9
MCV: 82.9
Platelets: 141 — ABNORMAL LOW
RBC: 3.77 — ABNORMAL LOW
RDW: 14.8 — ABNORMAL HIGH
WBC: 7.6

## 2011-05-04 LAB — RAPID URINE DRUG SCREEN, HOSP PERFORMED
Amphetamines: NOT DETECTED
Cocaine: NOT DETECTED
Opiates: NOT DETECTED

## 2011-05-07 LAB — POCT URINALYSIS DIP (DEVICE)
Glucose, UA: NEGATIVE
Ketones, ur: NEGATIVE
Nitrite: POSITIVE — AB
Operator id: 120861
Protein, ur: 30 — AB

## 2011-05-08 LAB — POCT URINALYSIS DIP (DEVICE)
Bilirubin Urine: NEGATIVE
Hgb urine dipstick: NEGATIVE
Ketones, ur: NEGATIVE
Nitrite: NEGATIVE
Nitrite: NEGATIVE
Operator id: 148111
Operator id: 159681
Specific Gravity, Urine: 1.02
Urobilinogen, UA: 0.2
Urobilinogen, UA: 0.2
pH: 6.5

## 2011-05-08 LAB — URINALYSIS, ROUTINE W REFLEX MICROSCOPIC
Glucose, UA: NEGATIVE
Hgb urine dipstick: NEGATIVE
Hgb urine dipstick: NEGATIVE
Ketones, ur: >80 — AB
Ketones, ur: >80 — AB
Nitrite: NEGATIVE
Specific Gravity, Urine: 1.025

## 2011-05-08 LAB — CBC
HCT: 31.8 — ABNORMAL LOW
Hemoglobin: 10.6 — ABNORMAL LOW
MCHC: 33.5
MCV: 84.2
Platelets: 177
RDW: 13.5
WBC: 8.7

## 2011-05-08 LAB — BASIC METABOLIC PANEL
Creatinine, Ser: 0.5
GFR calc non Af Amer: >60

## 2011-05-08 LAB — RAPID URINE DRUG SCREEN, HOSP PERFORMED
Amphetamines: NOT DETECTED
Barbiturates: NOT DETECTED
Cocaine: NOT DETECTED

## 2011-05-09 LAB — POCT URINALYSIS DIP (DEVICE)
Glucose, UA: NEGATIVE
Hgb urine dipstick: NEGATIVE
Nitrite: NEGATIVE
Urobilinogen, UA: 0.2
pH: 7

## 2011-05-09 LAB — CBC
HCT: 27.2 — ABNORMAL LOW
HCT: 28.3 — ABNORMAL LOW
Hemoglobin: 9.1 — ABNORMAL LOW
Hemoglobin: 9.6 — ABNORMAL LOW
MCHC: 33.7
MCHC: 33.9
MCV: 85
Platelets: 164
Platelets: 169
RDW: 13.7

## 2011-05-09 LAB — COMPREHENSIVE METABOLIC PANEL
ALT: 14
ALT: 15
AST: 14
Alkaline Phosphatase: 49
BUN: 5 — ABNORMAL LOW
CO2: 23
CO2: 26
Calcium: 7.7 — ABNORMAL LOW
Calcium: 8.3 — ABNORMAL LOW
Chloride: 105
Creatinine, Ser: 0.46
GFR calc Af Amer: >60
GFR calc non Af Amer: >60
GFR calc non Af Amer: >60
Glucose, Bld: 106 — ABNORMAL HIGH
Glucose, Bld: 95
Sodium: 133 — ABNORMAL LOW
Sodium: 136
Total Bilirubin: 0.3
Total Protein: 5.4 — ABNORMAL LOW

## 2011-05-09 LAB — DIFFERENTIAL
Eosinophils Absolute: 0.1
Lymphs Abs: 0.8
Monocytes Relative: 8
Neutro Abs: 7.6
Neutrophils Relative %: 82 — ABNORMAL HIGH

## 2011-05-09 LAB — TRIGLYCERIDES
Triglycerides: 103
Triglycerides: 86

## 2011-05-09 LAB — MAGNESIUM
Magnesium: 1.7
Magnesium: 1.7

## 2011-05-09 LAB — PHOSPHORUS: Phosphorus: 4.7 — ABNORMAL HIGH

## 2011-05-10 LAB — CBC
HCT: 26.2 — ABNORMAL LOW
HCT: 34 — ABNORMAL LOW
Hemoglobin: 11.2 — ABNORMAL LOW
Hemoglobin: 12.4
Hemoglobin: 8.8 — ABNORMAL LOW
MCHC: 33.5
MCHC: 33.5
MCHC: 33.7
MCV: 84.7
MCV: 85.3
RBC: 3.07 — ABNORMAL LOW
RBC: 3.21 — ABNORMAL LOW
RBC: 3.44 — ABNORMAL LOW
RBC: 3.98
RDW: 13.7
RDW: 13.9
WBC: 13.3 — ABNORMAL HIGH
WBC: 8.8

## 2011-05-10 LAB — URINALYSIS, ROUTINE W REFLEX MICROSCOPIC
Bilirubin Urine: NEGATIVE
Glucose, UA: NEGATIVE
Hgb urine dipstick: NEGATIVE
Ketones, ur: >80 — AB
Leukocytes, UA: NEGATIVE
Nitrite: NEGATIVE
Protein, ur: 30 — AB
Protein, ur: 30 — AB
Urobilinogen, UA: 1

## 2011-05-10 LAB — COMPREHENSIVE METABOLIC PANEL
ALT: 14
ALT: 16
AST: 19
Albumin: 3.3 — ABNORMAL LOW
Alkaline Phosphatase: 61
BUN: 1 — ABNORMAL LOW
BUN: 5 — ABNORMAL LOW
CO2: 20
CO2: 21
CO2: 21
CO2: 22
Calcium: 8 — ABNORMAL LOW
Calcium: 8.1 — ABNORMAL LOW
Chloride: 106
Chloride: 108
Creatinine, Ser: 0.42
GFR calc Af Amer: >60
GFR calc Af Amer: >60
GFR calc non Af Amer: >60
GFR calc non Af Amer: >60
GFR calc non Af Amer: >60
GFR calc non Af Amer: >60
Glucose, Bld: 105 — ABNORMAL HIGH
Glucose, Bld: 112 — ABNORMAL HIGH
Glucose, Bld: 112 — ABNORMAL HIGH
Potassium: 3.5
Sodium: 133 — ABNORMAL LOW
Sodium: 135
Total Bilirubin: 0.7
Total Bilirubin: 0.8

## 2011-05-10 LAB — T4, FREE: Free T4: 1.07

## 2011-05-10 LAB — URINE MICROSCOPIC-ADD ON

## 2011-05-10 LAB — T3, FREE: T3, Free: 2.2 — ABNORMAL LOW (ref 2.3–4.2)

## 2011-05-10 LAB — I-STAT 8, (EC8 V) (CONVERTED LAB)
Acid-base deficit: 3 — ABNORMAL HIGH
BUN: 6
Chloride: 109
Glucose, Bld: 103 — ABNORMAL HIGH
pCO2, Ven: 16.3 — ABNORMAL LOW
pH, Ven: 7.606

## 2011-05-10 LAB — BASIC METABOLIC PANEL
BUN: 2 — ABNORMAL LOW
Calcium: 7.9 — ABNORMAL LOW
Chloride: 106
Chloride: 109
Creatinine, Ser: 0.41
Creatinine, Ser: 0.51
GFR calc Af Amer: >60

## 2011-05-10 LAB — POCT I-STAT CREATININE: Creatinine, Ser: 0.8

## 2011-05-10 LAB — MAGNESIUM
Magnesium: 1.5
Magnesium: 1.6

## 2011-05-10 LAB — FERRITIN: Ferritin: 33 (ref 10–291)

## 2011-05-10 LAB — POTASSIUM: Potassium: 3.4 — ABNORMAL LOW

## 2011-05-10 LAB — LIPASE, BLOOD
Lipase: 17
Lipase: 19

## 2011-05-10 LAB — AMYLASE: Amylase: 156 — ABNORMAL HIGH

## 2011-05-10 LAB — WET PREP, GENITAL: Yeast Wet Prep HPF POC: NONE SEEN

## 2011-05-24 ENCOUNTER — Inpatient Hospital Stay (INDEPENDENT_AMBULATORY_CARE_PROVIDER_SITE_OTHER)
Admission: RE | Admit: 2011-05-24 | Discharge: 2011-05-24 | Disposition: A | Discharge status: Home / Self Care | Payer: Medicaid Other | Source: Ambulatory Visit | Attending: Family Medicine | Admitting: Family Medicine

## 2011-05-24 DIAGNOSIS — J069 Acute upper respiratory infection, unspecified: Secondary | ICD-10-CM

## 2011-05-24 DIAGNOSIS — J310 Chronic rhinitis: Secondary | ICD-10-CM

## 2011-05-24 LAB — POCT RAPID STREP A: Streptococcus, Group A Screen (Direct): NEGATIVE

## 2011-06-10 ENCOUNTER — Emergency Department (HOSPITAL_COMMUNITY)
Admission: EM | Admit: 2011-06-10 | Discharge: 2011-06-10 | Disposition: A | Discharge status: Home / Self Care | Payer: Medicaid Other | Attending: Emergency Medicine | Admitting: Emergency Medicine

## 2011-06-10 DIAGNOSIS — F10929 Alcohol use, unspecified with intoxication, unspecified: Secondary | ICD-10-CM

## 2011-06-10 DIAGNOSIS — F1010 Alcohol abuse, uncomplicated: Secondary | ICD-10-CM | POA: Insufficient documentation

## 2011-06-10 DIAGNOSIS — R109 Unspecified abdominal pain: Secondary | ICD-10-CM | POA: Insufficient documentation

## 2011-06-10 DIAGNOSIS — R112 Nausea with vomiting, unspecified: Secondary | ICD-10-CM | POA: Insufficient documentation

## 2011-06-10 LAB — URINALYSIS, ROUTINE W REFLEX MICROSCOPIC
Nitrite: NEGATIVE
Protein, ur: 30 mg/dL — AB
Specific Gravity, Urine: 1.028 (ref 1.005–1.030)
Urobilinogen, UA: 1 mg/dL (ref 0.0–1.0)

## 2011-06-10 MED ORDER — PROMETHAZINE HCL 25 MG PO TABS: 25.0000 mg | ORAL_TABLET | Freq: Four times a day (QID) | ORAL | Status: DC | PRN

## 2011-06-10 MED ORDER — ONDANSETRON HCL 4 MG/2ML IJ SOLN
4.0000 mg | Freq: Once | INTRAMUSCULAR | Status: AC
  Administered 2011-06-10: 4 mg via INTRAVENOUS
  Filled 2011-06-10: qty 2

## 2011-06-10 MED ORDER — SODIUM CHLORIDE 0.9 % IV BOLUS (SEPSIS)
1000.0000 mL | Freq: Once | INTRAVENOUS | Status: AC
  Administered 2011-06-10: 1000 mL via INTRAVENOUS

## 2011-06-10 MED ORDER — FAMOTIDINE IN NACL 20-0.9 MG/50ML-% IV SOLN
20.0000 mg | Freq: Once | INTRAVENOUS | Status: AC
  Administered 2011-06-10: 20 mg via INTRAVENOUS
  Filled 2011-06-10: qty 50

## 2011-06-10 NOTE — ED Notes (Signed)
Pt states Nausea and vomiting since last night.denies being pregnant, states has had tubal ligation.

## 2011-06-10 NOTE — ED Notes (Signed)
Pt given discharge info and rx, stated understanding, amb indep out of facility.

## 2011-06-10 NOTE — ED Provider Notes (Signed)
History     CSN: [id number] Arrival date & time: 06/10/2011  8:10 AM   First MD Initiated Contact with Patient 06/10/11 0901      Chief Complaint  Patient presents with  . Emesis    pt in from home via ems states "i'm sick" pt states been vomiting all night after etoh use states pain in the abd pt is in no apaprent distress    (Consider location/radiation/quality/duration/timing/severity/associated sxs/prior treatment) HPI Comments: Patient states that she began vomiting after alcohol consumption. States she hasn't felt this bad before - had multiple episodes of emesis. Denies coingestions. She denies chest pain, shortness of breath, urinary symptoms  Patient is a 25 y.o. female presenting with vomiting. The history is provided by the patient. No language interpreter was used.  Emesis  This is a new problem. The current episode started 3 to 5 hours ago. The problem occurs 2 to 4 times per day. The problem has been gradually worsening. The emesis has an appearance of stomach contents. There has been no fever. Associated symptoms include abdominal pain (mild diffuse). Pertinent negatives include no arthralgias, no chills, no cough, no diarrhea, no fever, no headaches and no myalgias.    History reviewed. No pertinent past medical history.  History reviewed. No pertinent past surgical history.  No family history on file.  History  Substance Use Topics  . Smoking status: Current Everyday Smoker  . Smokeless tobacco: Not on file  . Alcohol Use: Yes     last drink last night    OB History    Grav Para Term Preterm Abortions TAB SAB Ect Mult Living                  Review of Systems  Constitutional: Negative for fever, chills, activity change and appetite change.  HENT: Negative for congestion, sore throat, rhinorrhea, neck pain and neck stiffness.   Respiratory: Negative for cough and shortness of breath.   Cardiovascular: Negative for chest pain and palpitations.    Gastrointestinal: Positive for nausea, vomiting and abdominal pain (mild diffuse). Negative for diarrhea and constipation.  Genitourinary: Negative for dysuria, urgency, frequency, flank pain, vaginal bleeding and vaginal discharge.  Musculoskeletal: Negative for myalgias, back pain and arthralgias.  Neurological: Negative for dizziness, weakness, light-headedness, numbness and headaches.  All other systems reviewed and are negative.    Allergies  Review of patient's allergies indicates no known allergies.  Home Medications   Current Outpatient Rx  Name Route Sig Dispense Refill  . PROMETHAZINE HCL 25 MG PO TABS Oral Take 1 tablet (25 mg total) by mouth every 6 (six) hours as needed for nausea. 20 tablet 0    BP 102/61  Pulse 89  Temp(Src) 98.1 F (36.7 C) (Oral)  Resp 16  SpO2 100%  LMP 06/07/2011  Physical Exam  Nursing note and vitals reviewed. Constitutional: She is oriented to person, place, and time. She appears well-developed and well-nourished. No distress.  HENT:  Head: Normocephalic and atraumatic.  Mouth/Throat: Oropharynx is clear and moist.  Eyes: Conjunctivae and EOM are normal. Pupils are equal, round, and reactive to light.  Neck: Normal range of motion. Neck supple.  Cardiovascular: Normal rate, regular rhythm, normal heart sounds and intact distal pulses.   Pulmonary/Chest: Effort normal and breath sounds normal. No respiratory distress.  Abdominal: Soft. Bowel sounds are normal. There is no tenderness.  Musculoskeletal: Normal range of motion. She exhibits no tenderness.  Neurological: She is alert and oriented to person, place, and  time.  Skin: Skin is warm and dry.       Good skin turgor and cap refill <3 sec    ED Course  Procedures (including critical care time)  Labs Reviewed  URINALYSIS, ROUTINE W REFLEX MICROSCOPIC - Abnormal; Notable for the following:    Color, Urine AMBER (*) BIOCHEMICALS MAY BE AFFECTED BY COLOR   Appearance CLOUDY (*)     Ketones, ur >80 (*)    Protein, ur 30 (*)    Leukocytes, UA SMALL (*)    All other components within normal limits  URINE MICROSCOPIC-ADD ON - Abnormal; Notable for the following:    Squamous Epithelial / LPF MANY (*)    Bacteria, UA MANY (*)    All other components within normal limits  POCT PREGNANCY, URINE   No results found.   1. Alcohol intoxication   2. Nausea and vomiting       MDM  Nausea and vomiting secondary to alcohol intoxication. Patient states she does have a significant amount of alcohol as she was celebrating her birthday. She received 3 L of fluid as well as 80 of Zofran. She was noncompliant with drinking water in the emergency department however she did request a meal tray. She'll be discharged home with Phenergan and instructed to continue oral hydration at home.        Dayton Bailiff, MD 06/10/11 [phone]

## 2012-02-22 ENCOUNTER — Encounter (HOSPITAL_COMMUNITY): Payer: Self-pay | Admitting: Adult Health

## 2012-02-22 ENCOUNTER — Emergency Department (HOSPITAL_COMMUNITY)
Admission: EM | Admit: 2012-02-22 | Discharge: 2012-02-22 | Disposition: A | Discharge status: Home / Self Care | Payer: Medicaid Other | Attending: Emergency Medicine | Admitting: Emergency Medicine

## 2012-02-22 ENCOUNTER — Emergency Department (HOSPITAL_COMMUNITY): Payer: Medicaid Other

## 2012-02-22 DIAGNOSIS — F17200 Nicotine dependence, unspecified, uncomplicated: Secondary | ICD-10-CM | POA: Insufficient documentation

## 2012-02-22 DIAGNOSIS — M79609 Pain in unspecified limb: Secondary | ICD-10-CM | POA: Insufficient documentation

## 2012-02-22 DIAGNOSIS — M79644 Pain in right finger(s): Secondary | ICD-10-CM
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
## 2012-02-25 ENCOUNTER — Encounter (HOSPITAL_COMMUNITY): Payer: Self-pay | Admitting: Emergency Medicine

## 2012-02-25 ENCOUNTER — Emergency Department (HOSPITAL_COMMUNITY)
Admission: EM | Admit: 2012-02-25 | Discharge: 2012-02-25 | Disposition: A | Discharge status: Home / Self Care | Payer: Medicaid Other | Source: Home / Self Care | Attending: Emergency Medicine | Admitting: Emergency Medicine

## 2012-02-25 ENCOUNTER — Emergency Department (INDEPENDENT_AMBULATORY_CARE_PROVIDER_SITE_OTHER): Payer: Medicaid Other

## 2012-02-25 DIAGNOSIS — S6390XA Sprain of unspecified part of unspecified wrist and hand, initial encounter: Secondary | ICD-10-CM

## 2012-02-25 DIAGNOSIS — S63619A Unspecified sprain of unspecified finger, initial encounter: Secondary | ICD-10-CM

## 2012-02-25 NOTE — ED Provider Notes (Signed)
Chief Complaint  Patient presents with  . Wound Check    History of Present Illness:   [name] is a 26 year old female who injured her right pinky finger this past Thursday, 5 days ago, while at work. She thinks she might have been the pinky backwards. 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 She went to the emergency room the day that this happened when x-ray was negative. The finger was not put in a splint. It still hurting mainly over the PIP joint. There is some swelling. She's unable to fully extend it or to flex it. She denies any numbness or tingling.  Review of Systems:  Other than noted above, the patient denies any of the following symptoms: Systemic:  No fevers, chills, sweats, or aches.  No fatigue or tiredness. Musculoskeletal:  No joint pain, arthritis, bursitis, swelling, back pain, or neck pain. Neurological:  No muscular weakness, paresthesias, headache, or trouble with speech or coordination.  No dizziness.   PMFSH:  Past medical history, family history, social history, meds, and allergies were reviewed.  Physical Exam:   Vital signs:  BP 117/82  Pulse 78  Temp 98.8 F (37.1 C) (Oral)  Resp 16  SpO2 100%  LMP 02/25/2012 Gen:  Alert and oriented times 3.  In no distress. Musculoskeletal: There is swelling, deformity, and tenderness to palpation over the PIP joint of the right little finger. She's unable to fully extend it, and cannot flex it more than a couple of degrees with pain. Otherwise, all joints had a full a ROM with no swelling, bruising or deformity.  No edema, pulses full. Extremities were warm and pink.  Capillary refill was brisk.  Skin:  Clear, warm and dry.  No rash. Neuro:  Alert and oriented times 3.  Muscle strength was normal.  Sensation was intact to light touch.   Radiology:  Dg Finger Little Right  02/25/2012  *RADIOLOGY REPORT*  Clinical Data: Finger injury.  RIGHT LITTLE FINGER 2+V  Comparison: 02/22/2012  Findings: No acute bony abnormality.   Specifically, no fracture, subluxation, or dislocation.  Soft tissues are intact.  IMPRESSION: Normal study.  Original Report Authenticated By: Cyndie Chime, M.D.   Dg Finger Little Right  02/22/2012  *RADIOLOGY REPORT*  Clinical Data: Fifth digit pain status post trauma.  RIGHT LITTLE FINGER 2+V  Comparison: None.  Findings: No displaced fracture or dislocation.  No aggressive osseous lesion.  IMPRESSION: No acute osseous abnormality identified. If clinical concern for a fracture persists, recommend a repeat radiograph in 5-10 days to evaluate for interval change or callus formation.  Original Report Authenticated By: Waneta Martins, M.D.   Course in Urgent Care Center:   She was placed in a finger splint.  Assessment:  The encounter diagnosis was Finger sprain.  Plan:   1.  The following meds were prescribed:   New Prescriptions   No medications on file   2.  The patient was instructed in symptomatic care, including rest and activity, elevation, application of ice and compression.  Appropriate handouts were given. 3.  The patient was told to return if becoming worse in any way, if no better in 3 or 4 days, and given some red flag symptoms that would indicate earlier return.   4.  The patient was told to follow up with Dr. Aldean Baker in one to 2 weeks.   Reuben Likes, MD 02/25/12 [phone]

## 2012-02-25 NOTE — ED Notes (Signed)
Patient reports she needs note for work.  Feels like "SAFETY PIN IS POKING IT".  Swelling is improving, painful

## 2012-05-08 ENCOUNTER — Encounter (HOSPITAL_COMMUNITY): Payer: Self-pay | Admitting: Emergency Medicine

## 2012-05-08 ENCOUNTER — Emergency Department (HOSPITAL_COMMUNITY)
Admission: EM | Admit: 2012-05-08 | Discharge: 2012-05-08 | Disposition: A | Discharge status: Home / Self Care | Payer: Medicaid Other | Source: Home / Self Care | Attending: Emergency Medicine | Admitting: Emergency Medicine

## 2012-05-08 DIAGNOSIS — H109 Unspecified conjunctivitis: Secondary | ICD-10-CM

## 2012-05-08 DIAGNOSIS — J069 Acute upper respiratory infection, unspecified: Secondary | ICD-10-CM

## 2012-05-08 MED ORDER — FEXOFENADINE-PSEUDOEPHED ER 60-120 MG PO TB12: 1.0000 | ORAL_TABLET | Freq: Two times a day (BID) | ORAL | Status: DC

## 2012-05-08 MED ORDER — BENZONATATE 200 MG PO CAPS
200.0000 mg | ORAL_CAPSULE | Freq: Three times a day (TID) | ORAL | Status: DC | PRN
Start: 2012-05-08 — End: 2013-06-13

## 2012-05-08 MED ORDER — CIPROFLOXACIN HCL 0.3 % OP SOLN: OPHTHALMIC | Status: DC

## 2012-05-08 NOTE — ED Notes (Signed)
Pt c/o body aches, sore throat with swollen lymph nodes, nonproductive cough, n/vomiting clear fluid. Both eyes appear red and irritated but on drainage from left eye. Pt denies fever and diarrhea.

## 2012-05-08 NOTE — ED Provider Notes (Signed)
Chief Complaint  Patient presents with  . Generalized Body Aches  . Eye Drainage    History of Present Illness:   [name] is a 26 year old female who presents with a three-day history of redness of both of her eyes. This began in the left eye then spread to the right. Her eyes have been watering and she's had yellowish green discharge and some crusting of the eyelids. Her vision has been normal. She also complains of headache, chills, sore throat, swollen glands, rhinorrhea, and feels achy all over. She denies any fever, cough, or GI complaints.  Review of Systems:  Other than noted above, the patient denies any of the following symptoms: Systemic:  No fever, chills, sweats, fatigue, or weight loss. Eye:  No redness, eye pain, photophobia, discharge, blurred vision, or diplopia. ENT:  No nasal congestion, rhinorrhea, or sore throat. Lymphatic:  No adenopathy. Skin:  No rash or pruritis.  PMFSH:  Past medical history, family history, social history, meds, and allergies were reviewed.  Physical Exam:   Vital signs:  BP 122/79  Pulse 74  Temp 98.4 F (36.9 C) (Oral)  Resp 18  SpO2 100%  LMP 04/28/2012 General:  Alert and in no distress. Eye:  Lids and periorbital tissues are normal. Both eyes were watery, left more so than right. The right eye appeared completely normal otherwise. The left eye showed injection of the bulbar and palpebral conjunctiva. There was no exudate or crusting the eyelids. Corneas were intact, anterior chambers are normal. PERRLA, full EOMs. ENT:  TMs and canals clear.  Nasal mucosa normal.  No intra-oral lesions, mucous membranes moist, pharynx clear. Neck:  No adenopathy tenderness or mass. Lungs: Clear to auscultation, no wheezes, rales, or rhonchi. Skin:  Clear, warm and dry.  Assessment:  The primary encounter diagnosis was Conjunctivitis. A diagnosis of Viral upper respiratory infection was also pertinent to this visit.  Plan:   1.  The following meds were  prescribed:   New Prescriptions   BENZONATATE (TESSALON) 200 MG CAPSULE    Take 1 capsule (200 mg total) by mouth 3 (three) times daily as needed for cough.   CIPROFLOXACIN (CILOXAN) 0.3 % OPHTHALMIC SOLUTION    Apply 1 drop to each eye every 3 hours while awake   FEXOFENADINE-PSEUDOEPHEDRINE (ALLEGRA-D) 60-120 MG PER TABLET    Take 1 tablet by mouth every 12 (twelve) hours.   2.  The patient was instructed in symptomatic care and handouts were given. 3.  The patient was told to return if becoming worse in any way, if no better in 3 or 4 days, and given some red flag symptoms that would indicate earlier return.     Reuben Likes, MD 05/08/12 [phone]

## 2013-06-13 ENCOUNTER — Emergency Department (HOSPITAL_COMMUNITY)
Admission: EM | Admit: 2013-06-13 | Discharge: 2013-06-13 | Disposition: A | Discharge status: Home / Self Care | Payer: Medicaid Other | Source: Home / Self Care | Attending: Family Medicine | Admitting: Family Medicine

## 2013-06-13 ENCOUNTER — Encounter (HOSPITAL_COMMUNITY): Payer: Self-pay | Admitting: Emergency Medicine

## 2013-06-13 DIAGNOSIS — J069 Acute upper respiratory infection, unspecified: Secondary | ICD-10-CM

## 2013-06-13 MED ORDER — IPRATROPIUM BROMIDE 0.06 % NA SOLN: 2.0000 | Freq: Four times a day (QID) | NASAL | Status: DC

## 2013-06-13 MED ORDER — ANTIPYRINE-BENZOCAINE 5.4-1.4 % OT SOLN
3.0000 [drp] | OTIC | Status: DC | PRN
  Administered 2013-06-13: 4 [drp] via OTIC

## 2013-06-13 MED ORDER — DICLOFENAC SODIUM 75 MG PO TBEC: 75.0000 mg | DELAYED_RELEASE_TABLET | Freq: Two times a day (BID) | ORAL | Status: DC

## 2013-06-13 NOTE — ED Provider Notes (Signed)
[name] is a 27 y.o. female who presents to Urgent Care today for sore throat and left ear pain present for about one week. Patient has not tried any medications yet. She has decreased hearing in her left ear. She notes some runny nose. She denies any chest pain shortness of breath nausea vomiting or diarrhea. She denies any significant cough. She feels well otherwise   History reviewed. No pertinent past medical history. History  Substance Use Topics  . Smoking status: Current Every Day Smoker -- 1.00 packs/day    Types: Cigarettes  . Smokeless tobacco: Not on file  . Alcohol Use: No     Comment: last drink last night   ROS as above Medications reviewed. Current Facility-Administered Medications  Medication Dose Route Frequency Provider Last Rate Last Dose  . antipyrine-benzocaine (AURALGAN) otic solution 3-4 drop  3-4 drop Left Ear Q2H PRN Rodolph Bong, MD   4 drop at 06/13/13 1109   Current Outpatient Prescriptions  Medication Sig Dispense Refill  . diclofenac (VOLTAREN) 75 MG EC tablet Take 1 tablet (75 mg total) by mouth 2 (two) times daily.  30 tablet  0  . ipratropium (ATROVENT) 0.06 % nasal spray Place 2 sprays into both nostrils 4 (four) times daily.  15 mL  1    Exam:  BP 109/59  Pulse 70  Temp(Src) 98.9 F (37.2 C) (Oral)  Resp 20  SpO2 99%  LMP 05/27/2013 Gen: Well NAD HEENT: EOMI,  MMM, left tympanic membrane is retracted without erythema. Right tympanic membrane is normal-appearing. Posterior pharynx is mildly erythematous with cobblestone but without exudate. Lungs: Normal work of breathing. CTABL Heart: RRR no MRG Abd: NABS, Soft. NT, ND Exts: Non edematous BL  LE, warm and well perfused.   Patient was given Auralgan eardrops in her left ear which did not help.  Results for orders placed during the hospital encounter of 06/13/13 (from the past 24 hour(s))  POCT RAPID STREP A ([facility] ONLY)     Status: None   Collection Time    06/13/13 11:15 AM       Result Value Range   Streptococcus, Group A Screen (Direct) NEGATIVE  NEGATIVE   No results found.  Assessment and Plan: 27 y.o. female with viral URI with left otalgia.  Plan first symptomatic management with NSAIDs, Atrovent nasal spray, oral decongestants.  Followup with primary care provider.  Discussed warning signs or symptoms. Please see discharge instructions. Patient expresses understanding. Work note provided     Rodolph Bong, MD 06/13/13 1128

## 2013-06-13 NOTE — ED Notes (Signed)
Dr. Denyse Amass has assessed pt already.  Pt c/o sore throat and left ear pain x1 week Denies: f/v/n/d, SOB, wheezing Alert w/no signs of acute distress.

## 2013-06-15 LAB — CULTURE, GROUP A STREP

## 2013-09-26 ENCOUNTER — Emergency Department (HOSPITAL_COMMUNITY)
Admission: EM | Admit: 2013-09-26 | Discharge: 2013-09-26 | Disposition: A | Discharge status: Home / Self Care | Payer: Medicaid Other | Attending: Emergency Medicine | Admitting: Emergency Medicine

## 2013-09-26 ENCOUNTER — Encounter (HOSPITAL_COMMUNITY): Payer: Self-pay | Admitting: Emergency Medicine

## 2013-09-26 DIAGNOSIS — M545 Low back pain, unspecified: Secondary | ICD-10-CM | POA: Insufficient documentation

## 2013-09-26 DIAGNOSIS — R112 Nausea with vomiting, unspecified: Secondary | ICD-10-CM | POA: Insufficient documentation

## 2013-09-26 DIAGNOSIS — N939 Abnormal uterine and vaginal bleeding, unspecified: Secondary | ICD-10-CM

## 2013-09-26 DIAGNOSIS — N898 Other specified noninflammatory disorders of vagina: Secondary | ICD-10-CM | POA: Insufficient documentation

## 2013-09-26 DIAGNOSIS — R102 Pelvic and perineal pain: Secondary | ICD-10-CM

## 2013-09-26 DIAGNOSIS — N949 Unspecified condition associated with female genital organs and menstrual cycle: Secondary | ICD-10-CM | POA: Insufficient documentation

## 2013-09-26 DIAGNOSIS — Z3202 Encounter for pregnancy test, result negative: Secondary | ICD-10-CM | POA: Insufficient documentation

## 2013-09-26 DIAGNOSIS — F17200 Nicotine dependence, unspecified, uncomplicated: Secondary | ICD-10-CM | POA: Insufficient documentation

## 2013-09-26 LAB — CBC WITH DIFFERENTIAL/PLATELET
BASOS ABS: 0 10*3/uL (ref 0.0–0.1)
Basophils Relative: 0 % (ref 0–1)
EOS PCT: 1 % (ref 0–5)
Eosinophils Absolute: 0.1 10*3/uL (ref 0.0–0.7)
HCT: 36.8 % (ref 36.0–46.0)
Hemoglobin: 12.9 g/dL (ref 12.0–15.0)
LYMPHS ABS: 1.9 10*3/uL (ref 0.7–4.0)
LYMPHS PCT: 25 % (ref 12–46)
MCH: 28.9 pg (ref 26.0–34.0)
MCHC: 35.1 g/dL (ref 30.0–36.0)
MCV: 82.5 fL (ref 78.0–100.0)
Monocytes Absolute: 0.7 10*3/uL (ref 0.1–1.0)
Monocytes Relative: 9 % (ref 3–12)
NEUTROS PCT: 64 % (ref 43–77)
Neutro Abs: 4.9 10*3/uL (ref 1.7–7.7)
PLATELETS: 211 10*3/uL (ref 150–400)
RBC: 4.46 MIL/uL (ref 3.87–5.11)
RDW: 13.6 % (ref 11.5–15.5)
WBC: 7.7 10*3/uL (ref 4.0–10.5)

## 2013-09-26 LAB — URINALYSIS, ROUTINE W REFLEX MICROSCOPIC
Bilirubin Urine: NEGATIVE
GLUCOSE, UA: NEGATIVE mg/dL
Ketones, ur: 15 mg/dL — AB
LEUKOCYTES UA: NEGATIVE
Nitrite: NEGATIVE
PH: 5.5 (ref 5.0–8.0)
PROTEIN: NEGATIVE mg/dL
SPECIFIC GRAVITY, URINE: 1.034 — AB (ref 1.005–1.030)
Urobilinogen, UA: 1 mg/dL (ref 0.0–1.0)

## 2013-09-26 LAB — COMPREHENSIVE METABOLIC PANEL
ALK PHOS: 56 U/L (ref 39–117)
ALT: 12 U/L (ref 0–35)
AST: 15 U/L (ref 0–37)
Albumin: 3.8 g/dL (ref 3.5–5.2)
BUN: 9 mg/dL (ref 6–23)
CALCIUM: 9.2 mg/dL (ref 8.4–10.5)
CO2: 24 meq/L (ref 19–32)
Chloride: 99 mEq/L (ref 96–112)
Creatinine, Ser: 0.64 mg/dL (ref 0.50–1.10)
GFR calc Af Amer: >90 mL/min (ref >90)
GFR calc non Af Amer: >90 mL/min (ref >90)
Glucose, Bld: 80 mg/dL (ref 70–99)
POTASSIUM: 3.7 meq/L (ref 3.7–5.3)
SODIUM: 136 meq/L — AB (ref 137–147)
TOTAL PROTEIN: 7 g/dL (ref 6.0–8.3)
Total Bilirubin: 0.4 mg/dL (ref 0.3–1.2)

## 2013-09-26 LAB — POC URINE PREG, ED: Preg Test, Ur: NEGATIVE

## 2013-09-26 LAB — WET PREP, GENITAL
Trich, Wet Prep: NONE SEEN
YEAST WET PREP: NONE SEEN

## 2013-09-26 LAB — LIPASE, BLOOD: Lipase: 19 U/L (ref 11–59)

## 2013-09-26 LAB — URINE MICROSCOPIC-ADD ON

## 2013-09-26 MED ORDER — MORPHINE SULFATE 4 MG/ML IJ SOLN
4.0000 mg | Freq: Once | INTRAMUSCULAR | Status: AC
  Administered 2013-09-26: 4 mg via INTRAVENOUS
  Filled 2013-09-26: qty 1

## 2013-09-26 MED ORDER — SODIUM CHLORIDE 0.9 % IV BOLUS (SEPSIS)
1000.0000 mL | Freq: Once | INTRAVENOUS | Status: AC
  Administered 2013-09-26: 1000 mL via INTRAVENOUS

## 2013-09-26 MED ORDER — ONDANSETRON HCL 4 MG/2ML IJ SOLN
4.0000 mg | Freq: Once | INTRAMUSCULAR | Status: AC
  Administered 2013-09-26: 4 mg via INTRAVENOUS
  Filled 2013-09-26: qty 2

## 2013-09-26 NOTE — ED Notes (Signed)
IV team unable to draw labs. Finally able to obtain labs at [id number]

## 2013-09-26 NOTE — ED Provider Notes (Signed)
CSN: [id number]     Arrival date & time 09/26/13  1625 History   First MD Initiated Contact with Patient 09/26/13 1635     Chief Complaint  Patient presents with  . Vaginal Bleeding     (Consider location/radiation/quality/duration/timing/severity/associated sxs/prior Treatment) HPI 28 yo female presents with heavy vaginal bleeding. LMP was on 09/08/13. Patient states she is currently on her period which started 2 days ago. Patient reports lower abdominal cramping, with low back pain, and patient also reports passing " a small round clear sac in the toilet this morning". Patient denies any birth control. Patient states she had her tubes tied in 2008. Patient admits to current crampy abdominal paint that she rates at 8/10, that is intermittent. Patient states admits to nausea and vomiting x 3 episodes since last night. Denies diarrhea or constipation. Patient denies any urinary sxs or hematuria. Patient states she took [facility] powder last night for pain.  History reviewed. No pertinent past medical history. History reviewed. No pertinent past surgical history. No family history on file. History  Substance Use Topics  . Smoking status: Current Every Day Smoker -- 1.00 packs/day    Types: Cigarettes  . Smokeless tobacco: Not on file  . Alcohol Use: No     Comment: last drink last night   OB History   Grav Para Term Preterm Abortions TAB SAB Ect Mult Living                 Review of Systems    Allergies  Pollen extract  Home Medications   Current Outpatient Rx  Name  Route  Sig  Dispense  Refill  . Aspirin-Salicylamide-Caffeine (BC HEADACHE POWDER PO)   Oral   Take 1 Package by mouth daily as needed (pain).          BP 103/61  Pulse 68  Temp(Src) 99 F (37.2 C)  Resp 18  Ht 5\' 3"  (1.6 m)  Wt 135 lb (61.236 kg)  BMI 23.92 kg/m2  SpO2 99%  LMP 09/26/2013 Physical Exam  Nursing note and vitals reviewed. Constitutional: She is oriented to person, place, and time. She  appears well-developed and well-nourished. No distress.  HENT:  Head: Normocephalic and atraumatic.  Right Ear: Tympanic membrane and ear canal normal.  Left Ear: Tympanic membrane and ear canal normal.  Nose: Nose normal. Right sinus exhibits no maxillary sinus tenderness and no frontal sinus tenderness. Left sinus exhibits no maxillary sinus tenderness and no frontal sinus tenderness.  Mouth/Throat: Uvula is midline, oropharynx is clear and moist and mucous membranes are normal. No oropharyngeal exudate, posterior oropharyngeal edema or posterior oropharyngeal erythema.  Eyes: Conjunctivae are normal. Right eye exhibits no discharge. Left eye exhibits no discharge. No scleral icterus.  Neck: Phonation normal. Neck supple. No rigidity. No tracheal deviation, no edema and no erythema present.  Cardiovascular: Normal rate and regular rhythm.   Pulmonary/Chest: Effort normal and breath sounds normal. No stridor. No respiratory distress. She has no wheezes. She has no rales.  Abdominal: Soft. Bowel sounds are normal. She exhibits no distension. There is no hepatosplenomegaly. There is no tenderness. There is no rigidity, no rebound, no guarding, no tenderness at McBurney's point and negative Murphy's sign.  Genitourinary: Pelvic exam was performed with patient supine. There is no rash, tenderness, lesion or injury on the right labia. There is no rash, tenderness, lesion or injury on the left labia. Uterus is not tender. Cervix exhibits no motion tenderness, no discharge and no friability. Right adnexum  displays no mass, no tenderness and no fullness. Left adnexum displays no mass, no tenderness and no fullness. There is bleeding (Scant ) around the vagina. No erythema or tenderness around the vagina. No foreign body around the vagina. No signs of injury around the vagina. No vaginal discharge found.  Musculoskeletal: Normal range of motion. She exhibits no edema.  Lymphadenopathy:    She has no cervical  adenopathy.  Neurological: She is alert and oriented to person, place, and time.  Skin: Skin is warm and dry. She is not diaphoretic.  Psychiatric: She has a normal mood and affect. Her behavior is normal.    ED Course  Procedures (including critical care time) Labs Review Labs Reviewed  WET PREP, GENITAL - Abnormal; Notable for the following:    Clue Cells Wet Prep HPF POC FEW (*)    WBC, Wet Prep HPF POC FEW (*)    All other components within normal limits  URINALYSIS, ROUTINE W REFLEX MICROSCOPIC - Abnormal; Notable for the following:    Color, Urine AMBER (*)    APPearance CLOUDY (*)    Specific Gravity, Urine 1.034 (*)    Hgb urine dipstick SMALL (*)    Ketones, ur 15 (*)    All other components within normal limits  COMPREHENSIVE METABOLIC PANEL - Abnormal; Notable for the following:    Sodium 136 (*)    All other components within normal limits  GC/CHLAMYDIA PROBE AMP  URINE MICROSCOPIC-ADD ON  CBC WITH DIFFERENTIAL  LIPASE, BLOOD  POC URINE PREG, ED   Imaging Review No results found.   EKG Interpretation None      MDM   Final diagnoses:  Vaginal bleeding  Pelvic cramping   Patient afebrile with normal VS. Urine preg negative. Doubt ectopic. UA consistent with dehydration, with small hgb. Patient currently menstruating.  Hgb stable at 12.9 Mild hyponatremia at 136. Given IV fluids. Lipase negative Wet prep shows few clue cells and few WBC. This does not explain patient's vaginal bleeding.   Patient has benign abdominal and pelvic exam. Discussed labs, and exam findings with patient. [facility] for further evaluation of abnormal vaginal bleeding.Recommend return to ED should symptoms worsen. Patient agrees with plan. Discharged in good condition.   Meds given in ED:  Medications  sodium chloride 0.9 % bolus 1,000 mL (0 mLs Intravenous Stopped 09/26/13 2049)  morphine 4 MG/ML injection 4 mg (4 mg Intravenous Given  09/26/13 1853)  ondansetron (ZOFRAN) injection 4 mg (4 mg Intravenous Given 09/26/13 1853)    Discharge Medication List as of 09/26/2013  8:41 PM            Rudene AndaJacob Gray Talon Witting, PA-C 09/27/13 [phone]

## 2013-09-26 NOTE — ED Notes (Signed)
The pt is having her period since Thursday and today her period was heavier and she was passing waht appeared to be tissue and clots.  Painful lmp feb she has had a tubal ligation

## 2013-09-26 NOTE — ED Notes (Signed)
2 RN's attempted to gain IV access and obtain labs- unsuccessful. Called IV team.

## 2013-09-26 NOTE — ED Notes (Signed)
Pt discharged.Vital signs stable and GCS 15 

## 2013-09-26 NOTE — Discharge Instructions (Signed)
[facility] for further evaluation of heavy menstrual cycles. Return to the Emergency department if you develop any worsening symptoms, abdominal pain, fever/chills, or worsening vaginal bleeding.   Abnormal Uterine Bleeding Abnormal uterine bleeding means bleeding from the vagina that is not your normal menstrual period. This can be:  Bleeding or spotting between periods.  Bleeding after sex (sexual intercourse).  Bleeding that is heavier or more than normal.  Periods that last longer than usual.  Bleeding after menopause. There are many problems that may cause this. Treatment will depend on the cause of the bleeding. Any kind of bleeding that is not normal should be reviewed by your doctor.  HOME CARE Watch your condition for any changes. These actions may lessen any discomfort you are having:  Do not use tampons or douches as told by your doctor.  Change your pads often. You should get regular pelvic exams and Pap tests. Keep all appointments for tests as told by your doctor. GET HELP IF:  You are bleeding for more than 1 week.  You feel dizzy at times. GET HELP RIGHT AWAY IF:   You pass out.  You have to change pads every 15 to 30 minutes.  You have belly pain.  You have a fever.  You become sweaty or weak.  You are passing large blood clots from the vagina.  You feel sick to your stomach (nauseous) and throw up (vomit). MAKE SURE YOU:  Understand these instructions.  Will watch your condition.  Will get help right away if you are not doing well or get worse. Document Released: 05/06/2009 Document Revised: 04/29/2013 Document Reviewed: 02/05/2013 [facility] Patient Information 2014 [employer], [place].

## 2013-09-27 NOTE — ED Provider Notes (Signed)
Medical screening examination/treatment/procedure(s) were performed by non-physician practitioner and as supervising physician I was immediately available for consultation/collaboration.   EKG Interpretation None       Toy BakerAnthony T Thelma Viana, MD 09/27/13 [phone]

## 2013-09-28 LAB — GC/CHLAMYDIA PROBE AMP
CT Probe RNA: NEGATIVE
GC PROBE AMP APTIMA: NEGATIVE

## 2015-02-23 ENCOUNTER — Encounter (HOSPITAL_COMMUNITY): Payer: Self-pay | Admitting: Emergency Medicine

## 2015-02-23 ENCOUNTER — Emergency Department (INDEPENDENT_AMBULATORY_CARE_PROVIDER_SITE_OTHER)
Admission: EM | Admit: 2015-02-23 | Discharge: 2015-02-23 | Disposition: A | Discharge status: Home / Self Care | Payer: Medicaid Other | Source: Home / Self Care | Attending: Family Medicine | Admitting: Family Medicine

## 2015-02-23 DIAGNOSIS — L02419 Cutaneous abscess of limb, unspecified: Secondary | ICD-10-CM

## 2015-02-23 MED ORDER — CLINDAMYCIN HCL 300 MG PO CAPS: 300.0000 mg | ORAL_CAPSULE | Freq: Three times a day (TID) | ORAL | Status: DC

## 2015-02-23 NOTE — ED Notes (Signed)
Co abscess under left arm She tried using warm compresses No drainage

## 2015-02-23 NOTE — Discharge Instructions (Signed)
You developed an abscess which was drained in our clinic. Please take ibuprofen 600-800mg  once you get home as the numbing nmedicine will wear off in 1-2 hours. Please leave the bandage on until tomorrow morning at which time he may shower and clean the area as you normally would. Please use bandages daily until the drainage stops and the wound heals over. Please use the antibiotics as prescribed.

## 2015-02-23 NOTE — ED Provider Notes (Signed)
CSN: [id number]     Arrival date & time 02/23/15  1514 History   First MD Initiated Contact with Patient 02/23/15 1543     Chief Complaint  Patient presents with  . Abscess   (Consider location/radiation/quality/duration/timing/severity/associated sxs/prior Treatment) HPI  Left axillary ill. Started 7 days ago. Worsened over the first 4 days but no change since yesterday. Now presenting with symptoms of generalized aches and chills but denies any known fever, chest pain, shortness of breath, palpitations, rash. No drainage. Warm compresses without event.   History reviewed. No pertinent past medical history. History reviewed. No pertinent past surgical history. History reviewed. No pertinent family history. History  Substance Use Topics  . Smoking status: Current Every Day Smoker -- 1.00 packs/day    Types: Cigarettes  . Smokeless tobacco: Not on file  . Alcohol Use: No     Comment: last drink last night   OB History    No data available     Review of Systems Per HPI with all other pertinent systems negative.   Allergies  Pollen extract  Home Medications   Prior to Admission medications   Medication Sig Start Date End Date Taking? Authorizing Provider  Aspirin-Salicylamide-Caffeine (BC HEADACHE POWDER PO) Take 1 Package by mouth daily as needed (pain).    Historical Provider, MD  clindamycin (CLEOCIN) 300 MG capsule Take 1 capsule (300 mg total) by mouth 3 (three) times daily. 02/23/15   Ozella Rocks, MD   BP 109/73 mmHg  Pulse 94  Temp(Src) 99 F (37.2 C) (Oral)  Resp 16  SpO2 100% Physical Exam Physical Exam  Constitutional: oriented to person, place, and time. appears well-developed and well-nourished. No distress.  HENT:  Head: Normocephalic and atraumatic.  Eyes: EOMI. PERRL.  Neck: Normal range of motion.  Cardiovascular: RRR, no m/r/g, 2+ distal pulses,  Pulmonary/Chest: Effort normal and breath sounds normal. No respiratory distress.  Abdominal: Soft.  Bowel sounds are normal. NonTTP, no distension.  Musculoskeletal: Normal range of motion. Non ttp, no effusion.  Neurological: alert and oriented to person, place, and time.  Skin: Large left axillary area of induration and central erythema and fluctuance. Tender to palpation.  Psychiatric: normal mood and affect. behavior is normal. Judgment and thought content normal.   ED Course  INCISION AND DRAINAGE Date/Time: 02/23/2015 4:11 PM Performed by: Konrad Dolores, Elgie Maziarz J Authorized by: Konrad Dolores, Esmerelda Finnigan J Consent: Verbal consent obtained. Risks and benefits: risks, benefits and alternatives were discussed Consent given by: patient Patient identity confirmed: verbally with patient Type: abscess Location: L axilla. Local anesthetic: lidocaine 1% with epinephrine Anesthetic total: 3 ml Scalpel size: 11 Incision type: single straight Complexity: simple Drainage: purulent Drainage amount: scant Wound treatment: wound left open Patient tolerance: Patient tolerated the procedure well with no immediate complications   (including critical care time) Labs Review Labs Reviewed  CULTURE, ROUTINE-ABSCESS    Imaging Review No results found.   MDM   1. Abscess, axilla    I&D as above. Due to systemic symptoms will start clinda. AFVSS.   Ozella Rocks, MD 02/23/15 [phone]

## 2015-02-27 LAB — CULTURE, ROUTINE-ABSCESS: CULTURE: NO GROWTH

## 2015-02-28 NOTE — ED Notes (Signed)
Final report of abscess culture negative 

## 2016-01-26 ENCOUNTER — Ambulatory Visit (HOSPITAL_COMMUNITY)
Admission: EM | Admit: 2016-01-26 | Discharge: 2016-01-26 | Disposition: A | Discharge status: Home / Self Care | Payer: Medicaid Other | Attending: Emergency Medicine | Admitting: Emergency Medicine

## 2016-01-26 DIAGNOSIS — F17210 Nicotine dependence, cigarettes, uncomplicated: Secondary | ICD-10-CM | POA: Diagnosis not present

## 2016-01-26 DIAGNOSIS — A084 Viral intestinal infection, unspecified: Secondary | ICD-10-CM | POA: Insufficient documentation

## 2016-01-26 DIAGNOSIS — R112 Nausea with vomiting, unspecified: Secondary | ICD-10-CM | POA: Diagnosis present

## 2016-01-26 LAB — POCT RAPID STREP A: STREPTOCOCCUS, GROUP A SCREEN (DIRECT): NEGATIVE

## 2016-01-26 MED ORDER — ONDANSETRON HCL 4 MG PO TABS: 4.0000 mg | ORAL_TABLET | Freq: Four times a day (QID) | ORAL | Status: DC

## 2016-01-26 MED ORDER — ONDANSETRON 4 MG PO TBDP
ORAL_TABLET | ORAL | Status: AC
  Filled 2016-01-26: qty 1

## 2016-01-26 MED ORDER — ONDANSETRON 4 MG PO TBDP
4.0000 mg | ORAL_TABLET | Freq: Once | ORAL | Status: AC
  Administered 2016-01-26: 4 mg via ORAL

## 2016-01-26 NOTE — ED Notes (Signed)
Pt has had vomiting and nausea and body aches since yesterday Pt stated that she had a fever yesterday but does not currently have on Pt alert and oriented

## 2016-01-26 NOTE — ED Provider Notes (Signed)
CSN: [id number]     Arrival date & time 01/26/16  1302 History   First MD Initiated Contact with Patient 01/26/16 1544     Chief Complaint  Patient presents with  . Nausea  . Emesis  . Generalized Body Aches   (Consider location/radiation/quality/duration/timing/severity/associated sxs/prior Treatment) HPI Comments: 30 year old female is complaining of a one-day history of fatigue and malaise associated with nausea and vomiting. Last episode of vomiting approximately 11:00 this morning. She states that yesterday she took her temperature is 100.1 degrees. She saw also complaining of upper abdominal discomfort she states is related to vomiting. Denies diarrhea or constipation. Denies earache, sore throat, chest pain or shortness of breath.   No past medical history on file. No past surgical history on file. No family history on file. Social History  Substance Use Topics  . Smoking status: Current Every Day Smoker -- 1.00 packs/day    Types: Cigarettes  . Smokeless tobacco: Not on file  . Alcohol Use: No     Comment: last drink last night   OB History    No data available     Review of Systems  Constitutional: Positive for fever, activity change, appetite change and fatigue.  HENT: Negative.   Eyes: Negative.   Respiratory: Negative for cough and shortness of breath.   Cardiovascular: Negative for chest pain.  Gastrointestinal: Positive for nausea and vomiting. Negative for constipation.  Genitourinary: Negative.   Musculoskeletal: Negative.   Skin: Negative for color change and rash.  Neurological: Negative.   Psychiatric/Behavioral: Negative.   All other systems reviewed and are negative.   Allergies  Pollen extract  Home Medications   Prior to Admission medications   Medication Sig Start Date End Date Taking? Authorizing Provider  Aspirin-Salicylamide-Caffeine (BC HEADACHE POWDER PO) Take 1 Package by mouth daily as needed (pain).    Historical Provider, MD   clindamycin (CLEOCIN) 300 MG capsule Take 1 capsule (300 mg total) by mouth 3 (three) times daily. 02/23/15   Ozella Rocksavid J Merrell, MD  ondansetron (ZOFRAN) 4 MG tablet Take 1 tablet (4 mg total) by mouth every 6 (six) hours. 01/26/16   Hayden Rasmussenavid Bawi Lakins, NP   Meds Ordered and Administered this Visit   Medications  ondansetron (ZOFRAN-ODT) disintegrating tablet 4 mg (not administered)    BP 111/60 mmHg  Pulse 90  Temp(Src) 98.9 F (37.2 C) (Oral)  Resp 12  SpO2 100%  LMP 01/16/2016 (Exact Date) No data found.   Physical Exam  Constitutional: She is oriented to person, place, and time. She appears well-developed and well-nourished. No distress.  HENT:  Mouth/Throat: No oropharyngeal exudate.  Bilateral TMs are retracted. No erythema, effusion.  Oropharynx with minor erythema and some cobblestoning. No exudates or swelling. Airway widely patent.  Eyes: Conjunctivae and EOM are normal.  Neck: Normal range of motion. Neck supple.  Bilateral anterior cervical adenopathy. One note each side.  Cardiovascular: Normal rate, regular rhythm, normal heart sounds and intact distal pulses.   Pulmonary/Chest: Effort normal and breath sounds normal. No respiratory distress. She has no wheezes.  Abdominal: Soft. Bowel sounds are normal. She exhibits no distension and no mass. There is no tenderness. There is no rebound and no guarding.  Musculoskeletal: Normal range of motion.  Lymphadenopathy:    She has cervical adenopathy.  Neurological: She is alert and oriented to person, place, and time. She exhibits normal muscle tone.  Skin: Skin is warm and dry. No erythema.  Psychiatric: She has a normal mood and affect.  Nursing note and vitals reviewed.   ED Course  Procedures (including critical care time)  Labs Review Labs Reviewed  POCT RAPID STREP A   Results for orders placed or performed during the hospital encounter of 01/26/16  POCT rapid strep A [facility]  Result Value Ref Range    Streptococcus, Group A Screen (Direct) NEGATIVE NEGATIVE     Imaging Review No results found.   Visual Acuity Review  Right Eye Distance:   Left Eye Distance:   Bilateral Distance:    Right Eye Near:   Left Eye Near:    Bilateral Near:         MDM   1. Viral gastroenteritis    Meds ordered this encounter  Medications  . ondansetron (ZOFRAN-ODT) disintegrating tablet 4 mg    Sig:   . ondansetron (ZOFRAN) 4 MG tablet    Sig: Take 1 tablet (4 mg total) by mouth every 6 (six) hours.    Dispense:  12 tablet    Refill:  0    Order Specific Question:  Supervising Provider    Answer:  Charm RingsHONIG, ERIN J [[id number]]   Clear liquids for the next 24 hours. This slowly advance diet as tolerated. Zofran as directed. If the lymph nodes in your neck are not going away in the next 2-3 days he need to follow-up with primary care doctor. Call for an appointment.    Hayden Rasmussenavid Ryli Standlee, NP 01/26/16 1637  Hayden Rasmussenavid Elenora Hawbaker, NP 01/26/16 [phone]

## 2016-01-26 NOTE — Discharge Instructions (Signed)

## 2016-01-29 LAB — CULTURE, GROUP A STREP (THRC)

## 2016-04-13 ENCOUNTER — Emergency Department (HOSPITAL_COMMUNITY)
Admission: EM | Admit: 2016-04-13 | Discharge: 2016-04-13 | Disposition: A | Discharge status: Home / Self Care | Payer: Medicaid Other | Attending: Emergency Medicine | Admitting: Emergency Medicine

## 2016-04-13 ENCOUNTER — Encounter (HOSPITAL_COMMUNITY): Payer: Self-pay

## 2016-04-13 DIAGNOSIS — N939 Abnormal uterine and vaginal bleeding, unspecified: Secondary | ICD-10-CM | POA: Diagnosis present

## 2016-04-13 DIAGNOSIS — B9689 Other specified bacterial agents as the cause of diseases classified elsewhere: Secondary | ICD-10-CM | POA: Insufficient documentation

## 2016-04-13 DIAGNOSIS — Z7982 Long term (current) use of aspirin: Secondary | ICD-10-CM | POA: Insufficient documentation

## 2016-04-13 DIAGNOSIS — F17210 Nicotine dependence, cigarettes, uncomplicated: Secondary | ICD-10-CM | POA: Insufficient documentation

## 2016-04-13 DIAGNOSIS — R102 Pelvic and perineal pain: Secondary | ICD-10-CM | POA: Insufficient documentation

## 2016-04-13 DIAGNOSIS — N760 Acute vaginitis: Secondary | ICD-10-CM | POA: Insufficient documentation

## 2016-04-13 LAB — HCG, QUANTITATIVE, PREGNANCY: hCG, Beta Chain, Quant, S: <1 m[IU]/mL (ref <5)

## 2016-04-13 LAB — URINE MICROSCOPIC-ADD ON

## 2016-04-13 LAB — I-STAT BETA HCG BLOOD, ED (MC, WL, AP ONLY)

## 2016-04-13 LAB — WET PREP, GENITAL
Sperm: NONE SEEN
TRICH WET PREP: NONE SEEN
YEAST WET PREP: NONE SEEN

## 2016-04-13 LAB — CBC WITH DIFFERENTIAL/PLATELET
Basophils Absolute: 0 10*3/uL (ref 0.0–0.1)
Basophils Relative: 0 %
EOS ABS: 0.1 10*3/uL (ref 0.0–0.7)
EOS PCT: 1 %
HCT: 37.4 % (ref 36.0–46.0)
HEMOGLOBIN: 12.2 g/dL (ref 12.0–15.0)
Lymphocytes Relative: 18 %
Lymphs Abs: 1.8 10*3/uL (ref 0.7–4.0)
MCH: 27.7 pg (ref 26.0–34.0)
MCHC: 32.6 g/dL (ref 30.0–36.0)
MCV: 85 fL (ref 78.0–100.0)
MONO ABS: 0.5 10*3/uL (ref 0.1–1.0)
MONOS PCT: 5 %
NEUTROS PCT: 76 %
Neutro Abs: 7.5 10*3/uL (ref 1.7–7.7)
PLATELETS: 218 10*3/uL (ref 150–400)
RBC: 4.4 MIL/uL (ref 3.87–5.11)
RDW: 13.9 % (ref 11.5–15.5)
WBC: 9.9 10*3/uL (ref 4.0–10.5)

## 2016-04-13 LAB — URINALYSIS, ROUTINE W REFLEX MICROSCOPIC
Bilirubin Urine: NEGATIVE
GLUCOSE, UA: NEGATIVE mg/dL
Ketones, ur: NEGATIVE mg/dL
Leukocytes, UA: NEGATIVE
Nitrite: NEGATIVE
Protein, ur: NEGATIVE mg/dL
SPECIFIC GRAVITY, URINE: 1.025 (ref 1.005–1.030)
pH: 5.5 (ref 5.0–8.0)

## 2016-04-13 MED ORDER — IBUPROFEN 800 MG PO TABS
800.0000 mg | ORAL_TABLET | Freq: Once | ORAL | Status: AC
  Administered 2016-04-13: 800 mg via ORAL
  Filled 2016-04-13: qty 1

## 2016-04-13 MED ORDER — METRONIDAZOLE 500 MG PO TABS: 500.0000 mg | ORAL_TABLET | Freq: Two times a day (BID) | ORAL | 0 refills | Status: DC

## 2016-04-13 NOTE — ED Triage Notes (Addendum)
Pt here with c/o abnormal vaginal bleeding and has been bleeding since last Saturday 9/16. She states she has already had her period for September, which was Sept 3-7. She reports pain to lower abdominal. She states last time this happened she had a miscarriage.

## 2016-04-13 NOTE — Discharge Instructions (Signed)
Your workup today does not show an emergent cause for your symptoms today.  You may take Ibuprofen for pain.  Please take Flagyl twice daily for the next 7 days to treat bacterial vaginosis.  [facility]  Return if you experience dizziness, lightheadedness, increased pain, or any new symptoms.

## 2016-04-13 NOTE — ED Provider Notes (Signed)
[facility]   CSN: [id number] Arrival date & time: 04/13/16  1136     History   Chief Complaint Chief Complaint  Patient presents with  . Vaginal Bleeding    HPI Nichole PollackMonique L [name] is a 30 y.o. female.  HPI  Patient presents with 5 days of sudden onset, constant, moderate vaginal bleeding.  No clots.  LMP 03/25/16.  She states she has been using 5-6 super plus tampons per day.  Associated symptoms include mild lower abdominal pain.  No fever, chills, N/V/D, bloody stools, vaginal discharge, or urinary symptoms.  No dizziness, lightheadedness, or syncope.  This has never happened before.  Denies new sexual partners or concern for STD.  She does not have a GYN.   History reviewed. No pertinent past medical history.  There are no active problems to display for this patient.   History reviewed. No pertinent surgical history.  OB History    No data available       Home Medications    Prior to Admission medications   Medication Sig Start Date End Date Taking? Authorizing Provider  Aspirin-Salicylamide-Caffeine (BC HEADACHE POWDER PO) Take 1 Package by mouth daily as needed (pain).    Historical Provider, MD  clindamycin (CLEOCIN) 300 MG capsule Take 1 capsule (300 mg total) by mouth 3 (three) times daily. 02/23/15   Ozella Rocksavid J Merrell, MD  metroNIDAZOLE (FLAGYL) 500 MG tablet Take 1 tablet (500 mg total) by mouth 2 (two) times daily. 04/13/16   Isabella Roemmich, PA-C  ondansetron (ZOFRAN) 4 MG tablet Take 1 tablet (4 mg total) by mouth every 6 (six) hours. 01/26/16   Hayden Rasmussenavid Mabe, NP    Family History No family history on file.  Social History Social History  Substance Use Topics  . Smoking status: Current Every Day Smoker    Packs/day: 1.00    Types: Cigarettes  . Smokeless tobacco: Never Used  . Alcohol use No     Comment: last drink last night     Allergies   Pollen extract   Review of Systems Review of Systems All other systems negative unless  otherwise stated in HPI   Physical Exam Updated Vital Signs BP 105/75   Pulse 75   Temp 99.4 F (37.4 C) (Oral)   Resp 18   LMP 03/25/2016 (Exact Date)   SpO2 96%   Physical Exam  Constitutional: She is oriented to person, place, and time. She appears well-developed and well-nourished.  Non-toxic appearance. She does not have a sickly appearance. She does not appear ill.  HENT:  Head: Normocephalic and atraumatic.  Mouth/Throat: Oropharynx is clear and moist.  Eyes: Conjunctivae are normal. Pupils are equal, round, and reactive to light.  Neck: Normal range of motion. Neck supple.  Cardiovascular: Normal rate and regular rhythm.   Pulmonary/Chest: Effort normal and breath sounds normal. No accessory muscle usage or stridor. No respiratory distress. She has no wheezes. She has no rhonchi. She has no rales.  Abdominal: Soft. Bowel sounds are normal. She exhibits no distension. There is no tenderness. There is no rebound and no guarding.  Genitourinary: Uterus is not tender. Cervix exhibits no motion tenderness and no discharge. Right adnexum displays no tenderness. Left adnexum displays no tenderness. There is bleeding in the vagina. No vaginal discharge found.  Genitourinary Comments: Chaperone present.  Blood in vaginal vault c/w menses.  No clots.   Musculoskeletal: Normal range of motion.  Lymphadenopathy:    She has no cervical adenopathy.  Neurological:  She is alert and oriented to person, place, and time.  Speech clear without dysarthria.  Skin: Skin is warm and dry.  Psychiatric: She has a normal mood and affect. Her behavior is normal.     ED Treatments / Results  Labs (all labs ordered are listed, but only abnormal results are displayed) Labs Reviewed  WET PREP, GENITAL - Abnormal; Notable for the following:       Result Value   Clue Cells Wet Prep HPF POC PRESENT (*)    WBC, Wet Prep HPF POC MODERATE (*)    All other components within normal limits  URINALYSIS,  ROUTINE W REFLEX MICROSCOPIC (NOT AT [facility]) - Abnormal; Notable for the following:    Color, Urine AMBER (*)    APPearance CLOUDY (*)    Hgb urine dipstick LARGE (*)    All other components within normal limits  URINE MICROSCOPIC-ADD ON - Abnormal; Notable for the following:    Squamous Epithelial / LPF 6-30 (*)    Bacteria, UA MANY (*)    All other components within normal limits  HCG, QUANTITATIVE, PREGNANCY  CBC WITH DIFFERENTIAL/PLATELET  I-STAT BETA HCG BLOOD, ED ([facility], [facility], [facility] ONLY)  GC/CHLAMYDIA PROBE AMP ([place]) NOT AT [facility]    EKG  EKG Interpretation None       Radiology No results found.  Procedures Procedures (including critical care time)  Medications Ordered in ED Medications  ibuprofen (ADVIL,MOTRIN) tablet 800 mg (800 mg Oral Given 04/13/16 1326)     Initial Impression / Assessment and Plan / ED Course  I have reviewed the triage vital signs and the nursing notes.  Pertinent labs & imaging results that were available during my care of the patient were reviewed by me and considered in my medical decision making (see chart for details).  Clinical Course   Patient presents with 5 days of moderate vaginal bleeding.  VSS, NAD.  No syncope, lightheadedness, or dizziness.  On exam, abdomen soft and benign without rebound, guarding, or rigidity.  Low suspicion for TOA.  No CMT or adnexal tenderness.  This makes PID less likely.   Bleeding present c/w menses.  She is NOT pregnant. Hgb stable.   Wet prep shows BV, will treat with Flagyl.  Many WBCs, could be 2/2 to blood vs infection.  GC/Chlamydia obtained. She is hemodynamically stable.  Discussed outpatient follow up with [facility] for pelvic ultrasound and further evaluation, and there does not appear to be an emergent cause for her symptoms.  Return precautions discussed.  Stable for discharge.    Final Clinical Impressions(s) / ED Diagnoses   Final diagnoses:  Vaginal bleeding  BV (bacterial vaginosis)    New  Prescriptions Discharge Medication List as of 04/13/2016  2:04 PM    START taking these medications   Details  metroNIDAZOLE (FLAGYL) 500 MG tablet Take 1 tablet (500 mg total) by mouth 2 (two) times daily., Starting Fri 04/13/2016, Print         Cheri Fowler, PA-C 04/13/16 1459    Lavera Guise, MD 04/14/16 1048

## 2016-04-16 LAB — GC/CHLAMYDIA PROBE AMP (~~LOC~~) NOT AT ARMC
Chlamydia: NEGATIVE
NEISSERIA GONORRHEA: NEGATIVE

## 2016-05-21 ENCOUNTER — Ambulatory Visit (HOSPITAL_COMMUNITY)
Admission: EM | Admit: 2016-05-21 | Discharge: 2016-05-21 | Disposition: A | Discharge status: Home / Self Care | Payer: Medicaid Other | Attending: Family Medicine | Admitting: Family Medicine

## 2016-05-21 ENCOUNTER — Encounter (HOSPITAL_COMMUNITY): Payer: Self-pay | Admitting: Emergency Medicine

## 2016-05-21 DIAGNOSIS — G44209 Tension-type headache, unspecified, not intractable: Secondary | ICD-10-CM

## 2016-05-21 DIAGNOSIS — K029 Dental caries, unspecified: Secondary | ICD-10-CM | POA: Diagnosis not present

## 2016-05-21 MED ORDER — AMOXICILLIN 500 MG PO CAPS: 500.0000 mg | ORAL_CAPSULE | Freq: Three times a day (TID) | ORAL | 0 refills | Status: DC

## 2016-05-21 NOTE — ED Triage Notes (Signed)
[facility] a [facility] of a headache that is sensitive to light and causing nausea. She stated that they are recurring and this one has lasted for 1 week.

## 2016-05-21 NOTE — ED Provider Notes (Signed)
[facility]    CSN: [id number] Arrival date & time: 05/21/16  1827     History   Chief Complaint Chief Complaint  Patient presents with  . Headache    HPI [name] is a 30 y.o. female.   This 30 year old patient [facility] complaint of a headache that is sensitive to light and causing nausea. She stated that they are recurring and this one has lasted for 1 week.  Patient know she has a significant cavity in her upper tooth and has a dental appointment. 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 She has not been sleeping well and would like couple days off.      History reviewed. No pertinent past medical history.  There are no active problems to display for this patient.   History reviewed. No pertinent surgical history.  OB History    No data available       Home Medications    Prior to Admission medications   Medication Sig Start Date End Date Taking? Authorizing Provider  HYDROcodone-acetaminophen (NORCO/VICODIN) 5-325 MG tablet Take 1 tablet by mouth every 6 (six) hours as needed for moderate pain.   Yes Historical Provider, MD  amoxicillin (AMOXIL) 500 MG capsule Take 1 capsule (500 mg total) by mouth 3 (three) times daily. 05/21/16   Elvina SidleKurt Catia Todorov, MD  Aspirin-Salicylamide-Caffeine (BC HEADACHE POWDER PO) Take 1 Package by mouth daily as needed (pain).    Historical Provider, MD  ondansetron (ZOFRAN) 4 MG tablet Take 1 tablet (4 mg total) by mouth every 6 (six) hours. 01/26/16   Hayden Rasmussenavid Mabe, NP    Family History History reviewed. No pertinent family history.  Social History Social History  Substance Use Topics  . Smoking status: Current Every Day Smoker    Packs/day: 1.00    Types: Cigarettes  . Smokeless tobacco: Never Used  . Alcohol use No     Comment: last drink last night     Allergies   Pollen extract   Review of Systems Review of Systems  Constitutional: Negative.     Eyes: Positive for photophobia.  Respiratory: Negative.   Cardiovascular: Negative.   Gastrointestinal: Positive for nausea.  Endocrine: Negative.   Genitourinary: Negative.   Neurological: Positive for headaches.     Physical Exam Triage Vital Signs ED Triage Vitals  Enc Vitals Group     BP 05/21/16 1838 107/60     Pulse Rate 05/21/16 1838 84     Resp 05/21/16 1838 16     Temp 05/21/16 1838 99.2 F (37.3 C)     Temp Source 05/21/16 1838 Oral     SpO2 05/21/16 1838 100 %     Weight --      Height --      Head Circumference --      Peak Flow --      Pain Score 05/21/16 1844 8     Pain Loc --      Pain Edu? --      Excl. in GC? --    No data found.   Updated Vital Signs BP 107/60 (BP Location: Right Arm)   Pulse 84   Temp 99.2 F (37.3 C) (Oral)   Resp 16   LMP 04/30/2016   SpO2 100%    Physical Exam  Constitutional: She is oriented to person, place, and time. She appears well-developed and well-nourished.  HENT:  Right Ear: External ear normal.  Left Ear: External ear normal.  Patient has large carry in tooth #4 and a small. Tooth #5 with some associated gingivitis.  Eyes: Conjunctivae are normal. Pupils are equal, round, and reactive to light.  Neck: Normal range of motion. Neck supple.  Pulmonary/Chest: Effort normal.  Musculoskeletal: Normal range of motion.  Neurological: She is alert and oriented to person, place, and time. No cranial nerve deficit. She exhibits normal muscle tone. Coordination normal.  Skin: Skin is warm and dry.  Nursing note and vitals reviewed.    UC Treatments / Results  Labs (all labs ordered are listed, but only abnormal results are displayed) Labs Reviewed - No data to display  EKG  EKG Interpretation None       Radiology No results found.  Procedures Procedures (including critical care time)  Medications Ordered in UC Medications - No data to display   Initial Impression / Assessment and Plan / UC Course   I have reviewed the triage vital signs and the nursing notes.  Pertinent labs & imaging results that were available during my care of the patient were reviewed by me and considered in my medical decision making (see chart for details).  Clinical Course    Final Clinical Impressions(s) / UC Diagnoses   Final diagnoses:  Tension-type headache, not intractable, unspecified chronicity pattern  Dental caries    New Prescriptions New Prescriptions   AMOXICILLIN (AMOXIL) 500 MG CAPSULE    Take 1 capsule (500 mg total) by mouth 3 (three) times daily.     Elvina SidleKurt Brisha Mccabe, MD 05/21/16 [phone]

## 2016-06-05 ENCOUNTER — Ambulatory Visit (HOSPITAL_COMMUNITY)
Admission: EM | Admit: 2016-06-05 | Discharge: 2016-06-05 | Disposition: A | Discharge status: Home / Self Care | Payer: Medicaid Other | Attending: Family Medicine | Admitting: Family Medicine

## 2016-06-05 ENCOUNTER — Encounter (HOSPITAL_COMMUNITY): Payer: Self-pay | Admitting: Emergency Medicine

## 2016-06-05 DIAGNOSIS — S161XXA Strain of muscle, fascia and tendon at neck level, initial encounter: Secondary | ICD-10-CM

## 2016-06-05 DIAGNOSIS — S39012A Strain of muscle, fascia and tendon of lower back, initial encounter: Secondary | ICD-10-CM | POA: Diagnosis not present

## 2016-06-05 MED ORDER — CYCLOBENZAPRINE HCL 10 MG PO TABS: 10.0000 mg | ORAL_TABLET | Freq: Three times a day (TID) | ORAL | 0 refills | Status: DC | PRN

## 2016-06-05 MED ORDER — KETOROLAC TROMETHAMINE 60 MG/2ML IM SOLN
60.0000 mg | Freq: Once | INTRAMUSCULAR | Status: AC
  Administered 2016-06-05: 60 mg via INTRAMUSCULAR

## 2016-06-05 MED ORDER — NAPROXEN 500 MG PO TABS: 500.0000 mg | ORAL_TABLET | Freq: Two times a day (BID) | ORAL | 0 refills | Status: DC | PRN

## 2016-06-05 MED ORDER — KETOROLAC TROMETHAMINE 60 MG/2ML IM SOLN
INTRAMUSCULAR | Status: AC
  Filled 2016-06-05: qty 2

## 2016-06-05 NOTE — Discharge Instructions (Signed)
You were given a shot of Toradol today to help with pain and swelling. Start Naproxen twice a day as needed for pain. May take Flexeril muscle relaxer 3 times a day as needed for muscle pain and spasms- will causes drowsiness. Apply warm moist heat to area as needed for comfort. Follow-up with your PCP in 2 to 3 days if not improving or go to ER if symptoms worsen.
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
## 2016-08-03 ENCOUNTER — Encounter (HOSPITAL_COMMUNITY): Payer: Self-pay

## 2016-08-03 ENCOUNTER — Ambulatory Visit (HOSPITAL_COMMUNITY)
Admission: EM | Admit: 2016-08-03 | Discharge: 2016-08-03 | Disposition: A | Discharge status: Home / Self Care | Payer: Medicaid Other | Attending: Emergency Medicine | Admitting: Emergency Medicine

## 2016-08-03 DIAGNOSIS — R197 Diarrhea, unspecified: Secondary | ICD-10-CM | POA: Diagnosis not present

## 2016-08-03 MED ORDER — ONDANSETRON 4 MG PO TBDP: 4.0000 mg | ORAL_TABLET | Freq: Three times a day (TID) | ORAL | 0 refills | Status: DC | PRN

## 2016-08-03 NOTE — ED Provider Notes (Signed)
HPI  SUBJECTIVE:  Nichole PollackMonique L [name] is a 31 y.o. female who presents with chills, nausea, watery, nonbloody diarrhea 3, decreased appetite starting yesterday. She states that she is overall getting better, but just needs a note to return to work tomorrow. She states that this is a "stomach bug" and states that her son has identical symptoms. She has been eating chicken and rice soup, crackers and ginger ale with improvement in her symptoms. No aggravating factors. She denies fevers, vomiting, abdominal pain. No urinary complaints, change in urine output. No raw uncooked foods, questionable leftovers. She has a past medical history negative for diabetes, hypertension, abdominal surgeries. LMP: 12/21. She denies the possibility of being pregnant. PMD: [facility].    History reviewed. No pertinent past medical history.  History reviewed. No pertinent surgical history.  No family history on file.  Social History  Substance Use Topics  . Smoking status: Current Every Day Smoker    Packs/day: 1.00    Types: Cigarettes  . Smokeless tobacco: Never Used  . Alcohol use No     Comment: last drink last night    No current facility-administered medications for this encounter.   Current Outpatient Prescriptions:  .  ondansetron (ZOFRAN ODT) 4 MG disintegrating tablet, Take 1 tablet (4 mg total) by mouth every 8 (eight) hours as needed for nausea or vomiting., Disp: 20 tablet, Rfl: 0  Allergies  Allergen Reactions  . Pollen Extract Hives     ROS  As noted in HPI.   Physical Exam  BP (!) 107/53 (BP Location: Left Arm)   Pulse 75   Temp 98.5 F (36.9 C) (Oral)   Resp 20   LMP 07/09/2016 (Within Weeks)   SpO2 100%   Constitutional: Well developed, well nourished, no acute distress Eyes: PERRL, EOMI, conjunctiva normal bilaterally HENT: Normocephalic, atraumatic,mucus membranes moist Respiratory: Clear to auscultation bilaterally, no rales, no wheezing, no  rhonchi Cardiovascular: Normal rate and rhythm, no murmurs, no gallops, no rubs Refill less than 2 seconds GI: Soft, nondistended, normal bowel sounds, nontender, no rebound, no guarding skin: No rash, skin intact. Good skin turgor Musculoskeletal: No edema, no tenderness, no deformities Neurologic: Alert & oriented x 3, CN II-XII grossly intact, no motor deficits, sensation grossly intact Psychiatric: Speech and behavior appropriate   ED Course   Medications - No data to display  No orders of the defined types were placed in this encounter.  No results found for this or any previous visit (from the past 24 hour(s)). No results found.  ED Clinical Impression  Diarrhea, unspecified type   ED Assessment/Plan  Patient appears nontoxic, has a benign abdomen. Her vitals are normal. She states that she overall she is getting better and is requesting a note so that she can return to work tomorrow. Appears to be a viral illness. Plan to send home with Zofran which will help with the nausea and constipation, and if the nausea is not large component, then she may try Imodium. 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 Advised to push electrolyte containing fluids. She'll follow-up with her primary care physician as needed.  Discussed MDM, plan and followup with patient. Patient agrees with plan.   Meds ordered this encounter  Medications  . ondansetron (ZOFRAN ODT) 4 MG disintegrating tablet    Sig: Take 1 tablet (4 mg total) by mouth every 8 (eight) hours as needed for nausea or vomiting.    Dispense:  20 tablet  Refill:  0    *This clinic note was created using Scientist, clinical (histocompatibility and immunogenetics). Therefore, there may be occasional mistakes despite careful proofreading.  ?   Domenick Gong, MD 08/03/16 2135

## 2016-08-03 NOTE — ED Triage Notes (Signed)
Pt complains of nausea, diarrhea and cough since yesterday and needs a work note. No fever.

## 2017-02-24 ENCOUNTER — Encounter (HOSPITAL_COMMUNITY): Payer: Self-pay | Admitting: Emergency Medicine

## 2017-02-24 ENCOUNTER — Ambulatory Visit (HOSPITAL_COMMUNITY)
Admission: EM | Admit: 2017-02-24 | Discharge: 2017-02-24 | Disposition: A | Discharge status: Home / Self Care | Payer: Medicaid Other | Attending: Family Medicine | Admitting: Family Medicine

## 2017-02-24 DIAGNOSIS — K047 Periapical abscess without sinus: Secondary | ICD-10-CM | POA: Diagnosis not present

## 2017-02-24 DIAGNOSIS — R220 Localized swelling, mass and lump, head: Secondary | ICD-10-CM

## 2017-02-24 MED ORDER — AMOXICILLIN 875 MG PO TABS: 875.0000 mg | ORAL_TABLET | Freq: Two times a day (BID) | ORAL | 0 refills | Status: DC

## 2017-02-24 MED ORDER — HYDROCODONE-ACETAMINOPHEN 5-325 MG PO TABS: 1.0000 | ORAL_TABLET | ORAL | 0 refills | Status: DC | PRN

## 2017-02-24 NOTE — Discharge Instructions (Signed)
Follow up with Dentist

## 2017-02-24 NOTE — ED Triage Notes (Signed)
[facility] a [facility] pain and facial swelling x 3 days.

## 2017-02-24 NOTE — ED Provider Notes (Signed)
  [facility]   [id number] 02/24/17 Arrival Time: 1759  ASSESSMENT & PLAN:  1. Dental abscess   2. Facial swelling   3. Dental infection     Meds ordered this encounter  Medications  . amoxicillin (AMOXIL) 875 MG tablet    Sig: Take 1 tablet (875 mg total) by mouth 2 (two) times daily.    Dispense:  20 tablet    Refill:  0    Order Specific Question:   Supervising Provider    Answer:   Eustace MooreMURRAY, LAURA W [[id number]]  . HYDROcodone-acetaminophen (NORCO/VICODIN) 5-325 MG tablet    Sig: Take 1-2 tablets by mouth every 4 (four) hours as needed.    Dispense:  8 tablet    Refill:  0    Order Specific Question:   Supervising Provider    Answer:   Eustace MooreMURRAY, LAURA W [[id number]]   Follow up with Dentist Reviewed expectations re: course of current medical issues. Questions answered. Outlined signs and symptoms indicating need for more acute intervention. Patient verbalized understanding. After Visit Summary given.   SUBJECTIVE:  Nichole PollackMonique L Brown is a 31 y.o. female who presents with complaint of Left upper dental pain and facial swelling.  She does have a dentist  ROS: As per HPI.   OBJECTIVE:  Vitals:   02/24/17 1833  BP: (!) 163/83  Pulse: 97  Resp: 16  Temp: 99.9 F (37.7 C)  TempSrc: Oral  SpO2: 99%     General appearance: alert; no distress HEENT: normocephalic; atraumatic; conjunctivae normal; TMs normal; nasal mucosa normal; oral mucosa normal Neck: supple Lungs: clear to auscultation bilaterally Heart: regular rate and rhythm Abdomen: soft, non-tender; bowel sounds normal; no masses or organomegaly; no guarding or rebound tenderness Back: no CVA tenderness Extremities: no cyanosis or edema; symmetrical with no gross deformities Skin: warm and dry Neurologic: normal symmetric reflexes; normal gait Psychological:  alert and cooperative; normal mood and affect    Labs Reviewed - No data to display  No results found.  Allergies  Allergen Reactions  .  Pollen Extract Hives    PMHx, SurgHx, SocialHx, Medications, and Allergies were reviewed in the Visit Navigator and updated as appropriate.      Deatra CanterOxford, William J, [place]FNP 02/24/17 [phone]

## 2017-03-31 ENCOUNTER — Ambulatory Visit (HOSPITAL_COMMUNITY)
Admission: EM | Admit: 2017-03-31 | Discharge: 2017-03-31 | Disposition: A | Discharge status: Home / Self Care | Payer: Medicaid Other | Attending: Urgent Care | Admitting: Urgent Care

## 2017-03-31 ENCOUNTER — Encounter (HOSPITAL_COMMUNITY): Payer: Self-pay | Admitting: *Deleted

## 2017-03-31 DIAGNOSIS — K047 Periapical abscess without sinus: Secondary | ICD-10-CM | POA: Diagnosis not present

## 2017-03-31 DIAGNOSIS — K0889 Other specified disorders of teeth and supporting structures: Secondary | ICD-10-CM

## 2017-03-31 DIAGNOSIS — R220 Localized swelling, mass and lump, head: Secondary | ICD-10-CM

## 2017-03-31 DIAGNOSIS — K029 Dental caries, unspecified: Secondary | ICD-10-CM

## 2017-03-31 MED ORDER — AMOXICILLIN-POT CLAVULANATE 875-125 MG PO TABS: 1.0000 | ORAL_TABLET | Freq: Two times a day (BID) | ORAL | 0 refills | Status: DC

## 2017-03-31 NOTE — ED Provider Notes (Signed)
    MRN: [id number] DOB: 10/23/1985  Subjective:   [name] is a 31 y.o. female presenting for chief complaint of Dental Pain  She last took an antibiotic course of amoxicillin at the beginning of August 2018 for dental abscess. She had improvement but unfortunately was not able to save enough money for her dental procedure. She returns today with recurrent tooth pain, facial swelling of the same area. Denies fever, inability to swallowing, chew. She is tolerating liquids and foods.    [name] is not currently taking medications and is allergic to pollen extract. Also denies past medical and surgical history.   Objective:   Vitals: BP 106/67   Pulse 78   Temp 98.3 F (36.8 C) (Oral)   Resp 16   LMP 03/20/2017 (Approximate)   SpO2 100%   Physical Exam  Constitutional: She is oriented to person, place, and time. She appears well-developed and well-nourished.  HENT:  Mouth/Throat:    Cardiovascular: Normal rate.   Pulmonary/Chest: Effort normal.  Neurological: She is alert and oriented to person, place, and time.   Assessment and Plan :   Dental abscess  Pain, dental  Tooth decayed  Facial swelling  Patient is to start Augmentin BID with food. Counseled patient on potential for adverse effects with medications prescribed today, patient verbalized understanding. I emphasized importance of making sure she addresses her dental care with her dentist. Patient agreed to finish antibiotic course and follow up with her dentist.  Nichole BambergMario Dexter Sauser, PA-C [facility] 03/31/2017  6:14 PM    [name], Nichole Strite, PA-C 03/31/17 1831

## 2017-03-31 NOTE — ED Triage Notes (Signed)
C/O left upper dental pain and swelling since yesterday.

## 2018-06-10 ENCOUNTER — Ambulatory Visit (HOSPITAL_COMMUNITY)
Admission: EM | Admit: 2018-06-10 | Discharge: 2018-06-10 | Disposition: A | Discharge status: Home / Self Care | Payer: Medicaid Other | Attending: Family Medicine | Admitting: Family Medicine

## 2018-06-10 ENCOUNTER — Encounter (HOSPITAL_COMMUNITY): Payer: Self-pay

## 2018-06-10 ENCOUNTER — Other Ambulatory Visit: Payer: Self-pay

## 2018-06-10 DIAGNOSIS — F17210 Nicotine dependence, cigarettes, uncomplicated: Secondary | ICD-10-CM | POA: Insufficient documentation

## 2018-06-10 DIAGNOSIS — N898 Other specified noninflammatory disorders of vagina: Secondary | ICD-10-CM | POA: Insufficient documentation

## 2018-06-10 MED ORDER — METRONIDAZOLE 500 MG PO TABS: 500.0000 mg | ORAL_TABLET | Freq: Two times a day (BID) | ORAL | 0 refills | Status: DC

## 2018-06-10 MED ORDER — FLUCONAZOLE 150 MG PO TABS: 150.0000 mg | ORAL_TABLET | Freq: Every day | ORAL | 0 refills | Status: DC

## 2018-06-10 NOTE — Discharge Instructions (Signed)
You were treated empirically for BV and yeast. Start diflucan and flagyl as directed. Cytology sent, you will be contacted with any positive results that requires further treatment. Refrain from sexual activity and alcohol use for the next 7 days. Monitor for any worsening of symptoms, fever, abdominal pain, nausea, vomiting, to follow up for reevaluation. ° °

## 2018-06-10 NOTE — ED Triage Notes (Signed)
Pt cc vaginal odor  X 2 weeks or more ans some discharge ( clear in color )

## 2018-06-10 NOTE — ED Provider Notes (Signed)
[facility]    CSN: [id number] Arrival date & time: 06/10/18  1353     History   Chief Complaint Chief Complaint  Patient presents with  . vaginal odor    HPI [name] is a 32 y.o. female.   32 year old female comes in for 1-2 week history of vaginal discharge with odor. States just finished her cycle and continued with symptoms. Denies vaginal itching. Denies fever, chills, night sweats. Denies abdominal pain, nausea, vomiting. Denies urinary symptoms such as frequency, dysuria, hematuria. Sexually active with 1 female partner, no condom use. LMP 06/03/2018     History reviewed. No pertinent past medical history.  Patient Active Problem List   Diagnosis Date Noted  . Dental abscess 03/31/2017    History reviewed. No pertinent surgical history.  OB History   None      Home Medications    Prior to Admission medications   Medication Sig Start Date End Date Taking? Authorizing Provider  amoxicillin-clavulanate (AUGMENTIN) 875-125 MG tablet Take 1 tablet by mouth every 12 (twelve) hours. 03/31/17   Wallis Bamberg, PA-C  fluconazole (DIFLUCAN) 150 MG tablet Take 1 tablet (150 mg total) by mouth daily. Take second dose 72 hours later if symptoms still persists. 06/10/18   Cathie Hoops, Demba Nigh V, PA-C  metroNIDAZOLE (FLAGYL) 500 MG tablet Take 1 tablet (500 mg total) by mouth 2 (two) times daily. 06/10/18   Belinda Fisher, PA-C    Family History Family History  Problem Relation Age of Onset  . Healthy Mother   . Healthy Father     Social History Social History   Tobacco Use  . Smoking status: Current Every Day Smoker    Packs/day: 1.00    Types: Cigarettes  . Smokeless tobacco: Never Used  Substance Use Topics  . Alcohol use: No  . Drug use: No     Allergies   Pollen extract   Review of Systems Review of Systems  Reason unable to perform ROS: See HPI as above.     Physical Exam Triage Vital Signs ED Triage Vitals  Enc Vitals Group     BP 06/10/18  1418 98/64     Pulse Rate 06/10/18 1418 84     Resp 06/10/18 1418 16     Temp 06/10/18 1418 98.9 F (37.2 C)     Temp src --      SpO2 06/10/18 1418 100 %     Weight 06/10/18 1416 125 lb (56.7 kg)     Height --      Head Circumference --      Peak Flow --      Pain Score 06/10/18 1416 5     Pain Loc --      Pain Edu? --      Excl. in GC? --    No data found.  Updated Vital Signs BP 98/64 (BP Location: Left Arm)   Pulse 84   Temp 98.9 F (37.2 C)   Resp 16   Wt 125 lb (56.7 kg)   LMP 06/03/2018   SpO2 100%   BMI 22.14 kg/m   Physical Exam  Constitutional: She is oriented to person, place, and time. She appears well-developed and well-nourished. No distress.  HENT:  Head: Normocephalic and atraumatic.  Eyes: Pupils are equal, round, and reactive to light. Conjunctivae are normal.  Cardiovascular: Normal rate, regular rhythm and normal heart sounds. Exam reveals no gallop and no friction rub.  No murmur heard. Pulmonary/Chest: Effort  normal and breath sounds normal. No stridor. No respiratory distress. She has no wheezes. She has no rales.  Abdominal: Soft. Bowel sounds are normal. She exhibits no mass. There is no tenderness. There is no rebound, no guarding and no CVA tenderness.  Neurological: She is alert and oriented to person, place, and time.  Skin: Skin is warm and dry.  Psychiatric: She has a normal mood and affect. Her behavior is normal. Judgment normal.     UC Treatments / Results  Labs (all labs ordered are listed, but only abnormal results are displayed) Labs Reviewed  CERVICOVAGINAL ANCILLARY ONLY    EKG None  Radiology No results found.  Procedures Procedures (including critical care time)  Medications Ordered in UC Medications - No data to display  Initial Impression / Assessment and Plan / UC Course  I have reviewed the triage vital signs and the nursing notes.  Pertinent labs & imaging results that were available during my care of the  patient were reviewed by me and considered in my medical decision making (see chart for details).    Patient was treated empirically for BV and yeast. Diflucan and flagyl as directed. Cytology sent, patient will be contacted with any positive results that require additional treatment. Patient to refrain from sexual activity for the next 7 days. Return precautions given.    Final Clinical Impressions(s) / UC Diagnoses   Final diagnoses:  Vaginal discharge    ED Prescriptions    Medication Sig Dispense Auth. Provider   metroNIDAZOLE (FLAGYL) 500 MG tablet Take 1 tablet (500 mg total) by mouth 2 (two) times daily. 14 tablet Odean Fester V, PA-C   fluconazole (DIFLUCAN) 150 MG tablet Take 1 tablet (150 mg total) by mouth daily. Take second dose 72 hours later if symptoms still persists. 2 tablet Threasa AlphaYu, Derren Suydam V, PA-C        Keland Peyton V, PA-C 06/10/18 1455

## 2018-06-11 LAB — CERVICOVAGINAL ANCILLARY ONLY
Chlamydia: NEGATIVE
Neisseria Gonorrhea: NEGATIVE
Trichomonas: NEGATIVE

## 2018-09-21 DIAGNOSIS — Z9889 Other specified postprocedural states: Secondary | ICD-10-CM

## 2018-09-21 HISTORY — PX: OTHER SURGICAL HISTORY: SHX169

## 2018-09-21 HISTORY — DX: Other specified postprocedural states: Z98.890

## 2018-12-12 ENCOUNTER — Telehealth: Payer: Self-pay | Admitting: *Deleted

## 2018-12-12 NOTE — Telephone Encounter (Signed)
Pt states she is having severe nausea in pregnancy.  Pt is scheduled for NOB visit. Pt was advised to try Unisom/B6 combo at night.  Pt states she has Phenergan from previous use and wants to know if she can take.  Per Dr Clearance Coots, pt may take Phenergan. Pt advised to contact office if no change in nausea.

## 2018-12-13 ENCOUNTER — Encounter (HOSPITAL_COMMUNITY): Payer: Self-pay

## 2018-12-13 ENCOUNTER — Other Ambulatory Visit: Payer: Self-pay

## 2018-12-13 ENCOUNTER — Inpatient Hospital Stay (HOSPITAL_COMMUNITY)
Admission: AD | Admit: 2018-12-13 | Discharge: 2018-12-14 | Disposition: A | Discharge status: Home / Self Care | Payer: Medicaid Other | Attending: Obstetrics & Gynecology | Admitting: Obstetrics & Gynecology

## 2018-12-13 DIAGNOSIS — O99331 Smoking (tobacco) complicating pregnancy, first trimester: Secondary | ICD-10-CM | POA: Diagnosis not present

## 2018-12-13 DIAGNOSIS — O219 Vomiting of pregnancy, unspecified: Secondary | ICD-10-CM

## 2018-12-13 DIAGNOSIS — Z3A01 Less than 8 weeks gestation of pregnancy: Secondary | ICD-10-CM

## 2018-12-13 DIAGNOSIS — F17210 Nicotine dependence, cigarettes, uncomplicated: Secondary | ICD-10-CM | POA: Insufficient documentation

## 2018-12-13 LAB — COMPREHENSIVE METABOLIC PANEL
ALT: 37 U/L (ref 0–44)
AST: 41 U/L (ref 15–41)
Albumin: 4.1 g/dL (ref 3.5–5.0)
Alkaline Phosphatase: 57 U/L (ref 38–126)
Anion gap: 15 (ref 5–15)
BUN: 9 mg/dL (ref 6–20)
CO2: 19 mmol/L — ABNORMAL LOW (ref 22–32)
Calcium: 9.4 mg/dL (ref 8.9–10.3)
Chloride: 104 mmol/L (ref 98–111)
Creatinine, Ser: 0.77 mg/dL (ref 0.44–1.00)
GFR calc Af Amer: >60 mL/min (ref >60)
GFR calc non Af Amer: >60 mL/min (ref >60)
Glucose, Bld: 129 mg/dL — ABNORMAL HIGH (ref 70–99)
Potassium: 3 mmol/L — ABNORMAL LOW (ref 3.5–5.1)
Sodium: 138 mmol/L (ref 135–145)
Total Bilirubin: 0.8 mg/dL (ref 0.3–1.2)
Total Protein: 7.5 g/dL (ref 6.5–8.1)

## 2018-12-13 LAB — URINALYSIS, ROUTINE W REFLEX MICROSCOPIC
Bilirubin Urine: NEGATIVE
Glucose, UA: NEGATIVE mg/dL
Hgb urine dipstick: NEGATIVE
Ketones, ur: 80 mg/dL — AB
Nitrite: NEGATIVE
Protein, ur: >=300 mg/dL — AB
Specific Gravity, Urine: 1.029 (ref 1.005–1.030)
pH: 5 (ref 5.0–8.0)

## 2018-12-13 LAB — TSH: TSH: 0.816 u[IU]/mL (ref 0.350–4.500)

## 2018-12-13 LAB — POCT PREGNANCY, URINE: Preg Test, Ur: POSITIVE — AB

## 2018-12-13 MED ORDER — LACTATED RINGERS IV BOLUS
1000.0000 mL | Freq: Once | INTRAVENOUS | Status: AC
  Administered 2018-12-13: 1000 mL via INTRAVENOUS

## 2018-12-13 MED ORDER — PROMETHAZINE HCL 12.5 MG PO TABS: 12.5000 mg | ORAL_TABLET | Freq: Four times a day (QID) | ORAL | 0 refills | Status: DC | PRN

## 2018-12-13 MED ORDER — FAMOTIDINE IN NACL 20-0.9 MG/50ML-% IV SOLN
20.0000 mg | Freq: Once | INTRAVENOUS | Status: AC
  Administered 2018-12-13: 20 mg via INTRAVENOUS
  Filled 2018-12-13: qty 50

## 2018-12-13 MED ORDER — PROMETHAZINE HCL 25 MG/ML IJ SOLN
25.0000 mg | Freq: Once | INTRAMUSCULAR | Status: AC
  Administered 2018-12-13: 25 mg via INTRAVENOUS
  Filled 2018-12-13: qty 1

## 2018-12-13 MED ORDER — SCOPOLAMINE 1 MG/3DAYS TD PT72
1.0000 | MEDICATED_PATCH | TRANSDERMAL | Status: DC
  Administered 2018-12-13: 21:00:00 1.5 mg via TRANSDERMAL
  Filled 2018-12-13: qty 1

## 2018-12-13 MED ORDER — SODIUM CHLORIDE 0.9 % IV SOLN
25.0000 mg | Freq: Once | INTRAVENOUS | Status: DC
  Filled 2018-12-13: qty 1

## 2018-12-13 MED ORDER — SODIUM CHLORIDE 0.9 % IV SOLN
8.0000 mg | Freq: Once | INTRAVENOUS | Status: AC
  Administered 2018-12-13: 8 mg via INTRAVENOUS
  Filled 2018-12-13: qty 4

## 2018-12-13 MED ORDER — METOCLOPRAMIDE HCL 5 MG/ML IJ SOLN
10.0000 mg | Freq: Once | INTRAMUSCULAR | Status: AC
  Administered 2018-12-13: 10 mg via INTRAVENOUS
  Filled 2018-12-13: qty 2

## 2018-12-13 MED ORDER — DIPHENHYDRAMINE HCL 50 MG/ML IJ SOLN
25.0000 mg | Freq: Once | INTRAMUSCULAR | Status: AC
  Administered 2018-12-13: 25 mg via INTRAVENOUS
  Filled 2018-12-13: qty 1

## 2018-12-13 NOTE — Discharge Instructions (Signed)
Morning Sickness    Morning sickness is when you feel sick to your stomach (nauseous) during pregnancy. You may feel sick to your stomach and throw up (vomit). You may feel sick in the morning, but you can feel this way at any time of day. Some women feel very sick to their stomach and cannot stop throwing up (hyperemesis gravidarum).  Follow these instructions at home:  Medicines   Take over-the-counter and prescription medicines only as told by your doctor. Do not take any medicines until you talk with your doctor about them first.   Taking multivitamins before getting pregnant can stop or lessen the harshness of morning sickness.  Eating and drinking   Eat dry toast or crackers before getting out of bed.   Eat 5 or 6 small meals a day.   Eat dry and bland foods like rice and baked potatoes.   Do not eat greasy, fatty, or spicy foods.   Have someone cook for you if the smell of food causes you to feel sick or throw up.   If you feel sick to your stomach after taking prenatal vitamins, take them at night or with a snack.   Eat protein when you need a snack. Nuts, yogurt, and cheese are good choices.   Drink fluids throughout the day.   Try ginger ale made with real ginger, ginger tea made from fresh grated ginger, or ginger candies.  General instructions   Do not use any products that have nicotine or tobacco in them, such as cigarettes and e-cigarettes. If you need help quitting, ask your doctor.   Use an air purifier to keep the air in your house free of smells.   Get lots of fresh air.   Try to avoid smells that make you feel sick.   Try:  ? Wearing a bracelet that is used for seasickness (acupressure wristband).  ? Going to a doctor who puts thin needles into certain body points (acupuncture) to improve how you feel.  Contact a doctor if:   You need medicine to feel better.   You feel dizzy or light-headed.   You are losing weight.  Get help right away if:   You feel very sick to your  stomach and cannot stop throwing up.   You pass out (faint).   You have very bad pain in your belly.  Summary   Morning sickness is when you feel sick to your stomach (nauseous) during pregnancy.   You may feel sick in the morning, but you can feel this way at any time of day.   Making some changes to what you eat may help your symptoms go away.  This information is not intended to replace advice given to you by your health care provider. Make sure you discuss any questions you have with your health care provider.  Document Released: 08/16/2004 Document Revised: 08/09/2016 Document Reviewed: 08/09/2016  Elsevier Interactive Patient Education  2019 Elsevier Inc[employer]

## 2018-12-13 NOTE — MAU Note (Signed)
[name] is a 33 y.o. at [redacted]w[redacted]d here in MAU reporting: nausea/vomiting since yesterday, took some phenergan and that helped but her husband did not want her to keep taking it without being able to eat. No pain or bleeding. No diarrhea  Onset of complaint: yesterday   Pain score: 0/10  Vitals:   12/13/18 1554  BP: 112/69  Pulse: 76  Resp: 18  Temp: 97.9 F (36.6 C)  SpO2: 100%      Lab orders placed from triage: UA, UPT

## 2018-12-13 NOTE — MAU Provider Note (Addendum)
Patient [name] is a 33 y.o.  [phone] At [redacted]w[redacted]d by uncertain LMP here with complaints of nausea/vomiting. She denies vaginal bleeding, vaginal pain, abdominal pain, vaginal discharge or other ob-gyn complaints. She denies fever, SOB, chills, body aches.     She arrived by EMS.  History     CSN: [id number] Arrival date and time: 12/13/18 1528 First Provider Initiated Contact with Patient 12/13/18 1636    Chief Complaint  Patient presents with  . Emesis  . Nausea   Emesis   This is a new problem. The current episode started today. The problem occurs more than 10 times per day. The problem has been unchanged. The emesis has an appearance of bile and stomach contents. There has been no fever. Pertinent negatives include no chest pain, chills, coughing, fever or headaches.  Patient says that she tried to take Phenergan today but she thinks she vomited it up.  She has a history of HEG; she says she had to be on  PICC line for her last pregnancy, which was 11 year ago.  OB History    Gravida  5   Para  3   Term  3   Preterm      AB  1   Living  3     SAB  1   TAB      Ectopic      Multiple      Live Births  3           History reviewed. No pertinent past medical history.  History reviewed. No pertinent surgical history.  Family History  Problem Relation Age of Onset  . Healthy Mother   . Healthy Father     Social History   Tobacco Use  . Smoking status: Current Every Day Smoker    Packs/day: 1.00    Types: Cigarettes  . Smokeless tobacco: Never Used  Substance Use Topics  . Alcohol use: No  . Drug use: No    Allergies:  Allergies  Allergen Reactions  . Pollen Extract Hives    Medications Prior to Admission  Medication Sig Dispense Refill Last Dose  . amoxicillin-clavulanate (AUGMENTIN) 875-125 MG tablet Take 1 tablet by mouth every 12 (twelve) hours. 14 tablet 0   . fluconazole (DIFLUCAN) 150 MG tablet Take 1 tablet (150 mg total) by mouth  daily. Take second dose 72 hours later if symptoms still persists. 2 tablet 0   . metroNIDAZOLE (FLAGYL) 500 MG tablet Take 1 tablet (500 mg total) by mouth 2 (two) times daily. 14 tablet 0     Review of Systems  Constitutional: Negative.  Negative for chills and fever.  HENT: Negative.   Respiratory: Negative.  Negative for cough.   Cardiovascular: Negative.  Negative for chest pain.  Gastrointestinal: Positive for vomiting.  Genitourinary: Negative.   Musculoskeletal: Negative.   Neurological: Negative for headaches.   Physical Exam   Blood pressure 112/69, pulse 76, temperature 97.9 F (36.6 C), temperature source Oral, resp. rate 18, height  (1.6 m), weight 54.4 kg, last menstrual period 11/02/2018, SpO2 100 %.  Physical Exam  Constitutional: She is oriented to person, place, and time. She appears well-developed.  HENT:  Head: Normocephalic.  Neck: Normal range of motion.  Respiratory: Effort normal.  GI: Soft.  Musculoskeletal: Normal range of motion.  Neurological: She is alert and oriented to person, place, and time.  Skin: Skin is warm and dry.    MAU Course  Procedures  MDM -  UA: positive for ketones; will do IV with phenergan and Pepcid 1800: patient with less vomiting but still feeling strong nausea; will give IV reglan and benadryl.  1915: patient still vomiting, but wants to try ice. WIll also offer Zofran IV.  1945: IV has infiltrated; will restart IV and hang Zofran-as of 2021, patient IV has not been working and so Zofran has not started.   CMP: Hypokalemia   [facility] care endorsed to [facility] at 2022.   Results for orders placed or performed during the hospital encounter of 12/13/18 (from the past 48 hour(s))  Urinalysis, Routine w reflex microscopic     Status: Abnormal   Collection Time: 12/13/18  3:44 PM  Result Value Ref Range   Color, Urine AMBER (A) YELLOW    Comment: BIOCHEMICALS MAY BE AFFECTED BY COLOR   APPearance CLOUDY (A) CLEAR    Specific Gravity, Urine 1.029 1.005 - 1.030   pH 5.0 5.0 - 8.0   Glucose, UA NEGATIVE NEGATIVE mg/dL   Hgb urine dipstick NEGATIVE NEGATIVE   Bilirubin Urine NEGATIVE NEGATIVE   Ketones, ur 80 (A) NEGATIVE mg/dL   Protein, ur >=409>=300 (A) NEGATIVE mg/dL   Nitrite NEGATIVE NEGATIVE   Leukocytes,Ua SMALL (A) NEGATIVE   RBC / HPF 0-5 0 - 5 RBC/hpf   WBC, UA 21-50 0 - 5 WBC/hpf   Bacteria, UA RARE (A) NONE SEEN   Squamous Epithelial / LPF 21-50 0 - 5   Mucus PRESENT    Hyaline Casts, UA PRESENT     Comment: Performed at [facility], [address] [place], [place] [address]  Pregnancy, urine POC     Status: Abnormal   Collection Time: 12/13/18  3:45 PM  Result Value Ref Range   Preg Test, Ur POSITIVE (A) NEGATIVE    Comment:        THE SENSITIVITY OF THIS METHODOLOGY IS >24 mIU/mL   Comprehensive metabolic panel     Status: Abnormal   Collection Time: 12/13/18  4:54 PM  Result Value Ref Range   Sodium 138 135 - 145 mmol/L   Potassium 3.0 (L) 3.5 - 5.1 mmol/L   Chloride 104 98 - 111 mmol/L   CO2 19 (L) 22 - 32 mmol/L   Glucose, Bld 129 (H) 70 - 99 mg/dL   BUN 9 6 - 20 mg/dL   Creatinine, Ser 4.780.77 0.44 - 1.00 mg/dL   Calcium 9.4 8.9 - 29.510.3 mg/dL   Total Protein 7.5 6.5 - 8.1 g/dL   Albumin 4.1 3.5 - 5.0 g/dL   AST 41 15 - 41 U/L   ALT 37 0 - 44 U/L   Alkaline Phosphatase 57 38 - 126 U/L   Total Bilirubin 0.8 0.3 - 1.2 mg/dL   GFR calc non Af Amer >60 >60 mL/min   GFR calc Af Amer >60 >60 mL/min   Anion gap 15 5 - 15    Comment: Performed at [facility], [address] [place], [place] [address]  TSH     Status: None   Collection Time: 12/13/18  9:03 PM  Result Value Ref Range   TSH 0.816 0.350 - 4.500 uIU/mL    Comment: Performed by a 3rd Generation assay with a functional sensitivity of <=0.01 uIU/mL. Performed at [facility], [address] [place], [place] [address]     Accepted care of patient from Luna KitchensKathryn Kooistra, CNM. Patient with persistent  nausea and vomiting from EMS. Had ketones in urine  and mild hypokalemia. Given IV pepcid, phenergan, Reglan and Benadryl with persistent nausea and vomiting. Will try scopolamine patch, additional 1L of fluids, and complete IV Zofran previously started when IV infiltrated.   2300: Checked on patient, was able to tolerate fluids. Still having some nausea and stomach discomfort. Checked TSH which was lower side of normal. Will give additional dose of IV phenergan and complete 2nd L of fluids then plan to discharge home.   Assessment and Plan  32yo [id number] at [redacted]w[redacted]d by LMP who presented by EMS with persistent nausea and vomiting in early pregnancy. Treated as above with improvement in symptoms. Discharged home with po phenergan and return precautions. Reviewed plan diet, instructions for phenergan use. Encouraged to establish prenatal care. Patient voiced understanding, all questions answered prior to discharge.   Cristal Deer. Earlene Plater, DO OB/GYN Fellow

## 2018-12-15 ENCOUNTER — Other Ambulatory Visit: Payer: Self-pay

## 2018-12-15 ENCOUNTER — Inpatient Hospital Stay (HOSPITAL_COMMUNITY)
Admission: AD | Admit: 2018-12-15 | Discharge: 2018-12-15 | Disposition: A | Discharge status: Home / Self Care | Payer: Medicaid Other | Attending: Obstetrics & Gynecology | Admitting: Obstetrics & Gynecology

## 2018-12-15 ENCOUNTER — Encounter (HOSPITAL_COMMUNITY): Payer: Self-pay | Admitting: *Deleted

## 2018-12-15 DIAGNOSIS — Z3A01 Less than 8 weeks gestation of pregnancy: Secondary | ICD-10-CM | POA: Diagnosis not present

## 2018-12-15 DIAGNOSIS — O211 Hyperemesis gravidarum with metabolic disturbance: Secondary | ICD-10-CM | POA: Insufficient documentation

## 2018-12-15 DIAGNOSIS — O210 Mild hyperemesis gravidarum: Secondary | ICD-10-CM | POA: Diagnosis not present

## 2018-12-15 DIAGNOSIS — E876 Hypokalemia: Secondary | ICD-10-CM | POA: Diagnosis not present

## 2018-12-15 DIAGNOSIS — Z87891 Personal history of nicotine dependence: Secondary | ICD-10-CM | POA: Insufficient documentation

## 2018-12-15 LAB — URINALYSIS, COMPLETE (UACMP) WITH MICROSCOPIC
Bacteria, UA: NONE SEEN
Glucose, UA: 50 mg/dL — AB
Hgb urine dipstick: NEGATIVE
Ketones, ur: 5 mg/dL — AB
Nitrite: POSITIVE — AB
Protein, ur: 100 mg/dL — AB
Specific Gravity, Urine: 1.036 — ABNORMAL HIGH (ref 1.005–1.030)
pH: 5 (ref 5.0–8.0)

## 2018-12-15 LAB — CBC
HCT: 35 % — ABNORMAL LOW (ref 36.0–46.0)
Hemoglobin: 12 g/dL (ref 12.0–15.0)
MCH: 28.2 pg (ref 26.0–34.0)
MCHC: 34.3 g/dL (ref 30.0–36.0)
MCV: 82.2 fL (ref 80.0–100.0)
Platelets: 295 K/uL (ref 150–400)
RBC: 4.26 MIL/uL (ref 3.87–5.11)
RDW: 13.3 % (ref 11.5–15.5)
WBC: 14.4 K/uL — ABNORMAL HIGH (ref 4.0–10.5)
nRBC: 0 % (ref 0.0–0.2)

## 2018-12-15 LAB — COMPREHENSIVE METABOLIC PANEL WITH GFR
ALT: 34 U/L (ref 0–44)
AST: 29 U/L (ref 15–41)
Albumin: 4.1 g/dL (ref 3.5–5.0)
Alkaline Phosphatase: 56 U/L (ref 38–126)
Anion gap: 14 (ref 5–15)
BUN: 8 mg/dL (ref 6–20)
CO2: 26 mmol/L (ref 22–32)
Calcium: 9.6 mg/dL (ref 8.9–10.3)
Chloride: 98 mmol/L (ref 98–111)
Creatinine, Ser: 0.61 mg/dL (ref 0.44–1.00)
GFR calc Af Amer: >60 mL/min (ref >60)
GFR calc non Af Amer: >60 mL/min (ref >60)
Glucose, Bld: 127 mg/dL — ABNORMAL HIGH (ref 70–99)
Potassium: 2.9 mmol/L — ABNORMAL LOW (ref 3.5–5.1)
Sodium: 138 mmol/L (ref 135–145)
Total Bilirubin: 0.9 mg/dL (ref 0.3–1.2)
Total Protein: 6.9 g/dL (ref 6.5–8.1)

## 2018-12-15 LAB — RAPID URINE DRUG SCREEN, HOSP PERFORMED
Amphetamines: NOT DETECTED
Barbiturates: NOT DETECTED
Benzodiazepines: NOT DETECTED
Cocaine: NOT DETECTED
Opiates: NOT DETECTED
Tetrahydrocannabinol: POSITIVE — AB

## 2018-12-15 MED ORDER — POTASSIUM CHLORIDE ER 20 MEQ PO TBCR: 40.0000 meq | EXTENDED_RELEASE_TABLET | Freq: Two times a day (BID) | ORAL | 0 refills | Status: DC

## 2018-12-15 MED ORDER — SODIUM CHLORIDE 0.9 % IV SOLN
25.0000 mg | Freq: Once | INTRAVENOUS | Status: AC
  Administered 2018-12-15: 07:00:00 25 mg via INTRAVENOUS
  Filled 2018-12-15: qty 1

## 2018-12-15 MED ORDER — SCOPOLAMINE 1 MG/3DAYS TD PT72: 1.0000 | MEDICATED_PATCH | TRANSDERMAL | 1 refills | Status: DC

## 2018-12-15 MED ORDER — FAMOTIDINE 20 MG PO TABS: 20.0000 mg | ORAL_TABLET | Freq: Every day | ORAL | 2 refills | Status: DC

## 2018-12-15 MED ORDER — PROMETHAZINE HCL 25 MG RE SUPP: 25.0000 mg | Freq: Four times a day (QID) | RECTAL | 1 refills | Status: DC | PRN

## 2018-12-15 MED ORDER — PROMETHAZINE HCL 12.5 MG PO TABS: 12.5000 mg | ORAL_TABLET | Freq: Four times a day (QID) | ORAL | 0 refills | Status: DC | PRN

## 2018-12-15 MED ORDER — SODIUM CHLORIDE 0.9 % IV SOLN
8.0000 mg | Freq: Once | INTRAVENOUS | Status: AC
  Administered 2018-12-15: 06:00:00 8 mg via INTRAVENOUS
  Filled 2018-12-15: qty 4

## 2018-12-15 MED ORDER — FAMOTIDINE IN NACL 20-0.9 MG/50ML-% IV SOLN
20.0000 mg | Freq: Once | INTRAVENOUS | Status: AC
  Administered 2018-12-15: 07:00:00 20 mg via INTRAVENOUS
  Filled 2018-12-15: qty 50

## 2018-12-15 NOTE — Discharge Instructions (Signed)
Morning Sickness  Morning sickness is when a woman feels nauseous during pregnancy. This nauseous feeling may or may not come with vomiting. It often occurs in the morning, but it can be a problem at any time of day. Morning sickness is most common during the first trimester. In some cases, it may continue throughout pregnancy. Although morning sickness is unpleasant, it is usually harmless unless the woman develops severe and continual vomiting (hyperemesis gravidarum), a condition that requires more intense treatment. What are the causes? The exact cause of this condition is not known, but it seems to be related to normal hormonal changes that occur in pregnancy. What increases the risk? You are more likely to develop this condition if:  You experienced nausea or vomiting before your pregnancy.  You had morning sickness during a previous pregnancy.  You are pregnant with more than one baby, such as twins. What are the signs or symptoms? Symptoms of this condition include:  Nausea.  Vomiting. How is this diagnosed? This condition is usually diagnosed based on your signs and symptoms. How is this treated? In many cases, treatment is not needed for this condition. Making some changes to what you eat may help to control symptoms. Your health care provider may also prescribe or recommend:  Vitamin B6 supplements.  Anti-nausea medicines.  Ginger. Follow these instructions at home: Medicines  Take over-the-counter and prescription medicines only as told by your health care provider. Do not use any prescription, over-the-counter, or herbal medicines for morning sickness without first talking with your health care provider.  Taking multivitamins before getting pregnant can prevent or decrease the severity of morning sickness in most women. Eating and drinking  Eat a piece of dry toast or crackers before getting out of bed in the morning.  Eat 5 or 6 small meals a day.  Eat dry and  bland foods, such as rice or a baked potato. Foods that are high in carbohydrates are often helpful.  Avoid greasy, fatty, and spicy foods.  Have someone cook for you if the smell of any food causes nausea and vomiting.  If you feel nauseous after taking prenatal vitamins, take the vitamins at night or with a snack.  Snack on protein foods between meals if you are hungry. Nuts, yogurt, and cheese are good options.  Drink fluids throughout the day.  Try ginger ale made with real ginger, ginger tea made from fresh grated ginger, or ginger candies. General instructions  Do not use any products that contain nicotine or tobacco, such as cigarettes and e-cigarettes. If you need help quitting, ask your health care provider.  Get an air purifier to keep the air in your house free of odors.  Get plenty of fresh air.  Try to avoid odors that trigger your nausea.  Consider trying these methods to help relieve symptoms: ? Wearing an acupressure wristband. These wristbands are often worn for seasickness. ? Acupuncture. Contact a health care provider if:  Your home remedies are not working and you need medicine.  You feel dizzy or light-headed.  You are losing weight. Get help right away if:  You have persistent and uncontrolled nausea and vomiting.  You faint.  You have severe pain in your abdomen. Summary  Morning sickness is when a woman feels nauseous during pregnancy. This nauseous feeling may or may not come with vomiting.  Morning sickness is most common during the first trimester.  It often occurs in the morning, but it can be a problem at  any time of day.  In many cases, treatment is not needed for this condition. Making some changes to what you eat may help to control symptoms. This information is not intended to replace advice given to you by your health care provider. Make sure you discuss any questions you have with your health care provider. Document Released:  08/30/2006 Document Revised: 08/11/2016 Document Reviewed: 08/11/2016 Elsevier Interactive Patient Education  2019 [employer]   Hypokalemia Hypokalemia means that the amount of potassium in the blood is lower than normal.Potassium is a chemical that helps regulate the amount of fluid in the body (electrolyte). It also stimulates muscle tightening (contraction) and helps nerves work properly.Normally, most of the bodys potassium is inside of cells, and only a very small amount is in the blood. Because the amount in the blood is so small, minor changes to potassium levels in the blood can be life-threatening. What are the causes? This condition may be caused by:  Antibiotic medicine.  Diarrhea or vomiting. Taking too much of a medicine that helps you have a bowel movement (laxative) can cause diarrhea and lead to hypokalemia.  Chronic kidney disease (CKD).  Medicines that help the body get rid of excess fluid (diuretics).  Eating disorders, such as bulimia.  Low magnesium levels in the body.  Sweating a lot. What are the signs or symptoms? Symptoms of this condition include:  Weakness.  Constipation.  Fatigue.  Muscle cramps.  Mental confusion.  Skipped heartbeats or irregular heartbeat (palpitations).  Tingling or numbness. How is this diagnosed? This condition is diagnosed with a blood test. How is this treated? Hypokalemia can be treated by taking potassium supplements by mouth or adjusting the medicines that you take. Treatment may also include eating more foods that contain a lot of potassium. If your potassium level is very low, you may need to get potassium through an IV tube in one of your veins and be monitored in the hospital. Follow these instructions at home:   Take over-the-counter and prescription medicines only as told by your health care provider. This includes vitamins and supplements.  Eat a healthy diet. A healthy diet includes fresh fruits and  vegetables, whole grains, healthy fats, and lean proteins.  If instructed, eat more foods that contain a lot of potassium, such as: ? Nuts, such as peanuts and pistachios. ? Seeds, such as sunflower seeds and pumpkin seeds. ? Peas, lentils, and lima beans. ? Whole grain and bran cereals and breads. ? Fresh fruits and vegetables, such as apricots, avocado, bananas, cantaloupe, kiwi, oranges, tomatoes, asparagus, and potatoes. ? Orange juice. ? Tomato juice. ? Red meats. ? Yogurt.  Keep all follow-up visits as told by your health care provider. This is important. Contact a health care provider if:  You have weakness that gets worse.  You feel your heart pounding or racing.  You vomit.  You have diarrhea.  You have diabetes (diabetes mellitus) and you have trouble keeping your blood sugar (glucose) in your target range. Get help right away if:  You have chest pain.  You have shortness of breath.  You have vomiting or diarrhea that lasts for more than 2 days.  You faint. This information is not intended to replace advice given to you by your health care provider. Make sure you discuss any questions you have with your health care provider. Document Released: 07/09/2005 Document Revised: 02/25/2016 Document Reviewed: 02/25/2016 Elsevier Interactive Patient Education  2019 [employer].

## 2018-12-15 NOTE — MAU Note (Addendum)
Pt was given phenergan yesterday and tried to take it around 2 am today with emesis that followed. Nausea and vomiting since she woke up this morning. The phenergan just made her "sleepy". Red streaks in the toilet earlier with vomiting, throat is sore from vomiting, pt denies other pain, vaginal discharge or spotting. Denies taking any other medications today. Pt reports "My throat is on fire". Pt has on her scopolamine patch behind her left ear from yesterday's visit. It is loose and was not firm to the skin.

## 2018-12-15 NOTE — MAU Provider Note (Addendum)
Patient [name] is a 33 y.o. [phone] At [redacted]w[redacted]d here with complaints of on-going NV. She was seen in MAU on 12-13-2018 for the same complaint; she was sent home with PO phenergan. She states that she tried to take it during the day on Sunday but it only helped a little.  She denies VB, vaginal discharge, pain with urination, low back pain, or other ob-gyn complaint. Repeatedly asking for something to drink as her throat hurts from vomiting.   History     CSN: [id number]  Arrival date and time: 12/15/18 0350   First Provider Initiated Contact with Patient 12/15/18 [phone]      Chief Complaint  Patient presents with  . Nausea  . Emesis   Emesis   This is a new problem. The current episode started in the past 7 days. Episode frequency: " a little bit less" vomiting. Progression since onset: slightly better. The emesis has an appearance of bile and stomach contents (occasional blood with vomiting). Pertinent negatives include no abdominal pain, chest pain, chills or coughing.    OB History    Gravida  5   Para  3   Term  3   Preterm      AB  1   Living  3     SAB  1   TAB      Ectopic      Multiple      Live Births  3           Past Medical History:  Diagnosis Date  . History of reversal of tubal ligation 09/2018  . Medical history non-contributory     Past Surgical History:  Procedure Laterality Date  . TUBAL LIGATION  2008  . Tubal reversal  09/2018    Family History  Problem Relation Age of Onset  . Healthy Mother   . Healthy Father     Social History   Tobacco Use  . Smoking status: Former Smoker    Packs/day: 1.00    Types: Cigarettes  . Smokeless tobacco: Never Used  Substance Use Topics  . Alcohol use: No  . Drug use: No    Allergies:  Allergies  Allergen Reactions  . Pollen Extract Hives    No medications prior to admission.    Review of Systems  Constitutional: Negative.  Negative for chills.  HENT: Negative.    Respiratory: Negative.  Negative for cough.   Cardiovascular: Negative for chest pain.  Gastrointestinal: Positive for vomiting. Negative for abdominal pain.   Physical Exam   Blood pressure 124/78, pulse 71, temperature 98.9 F (37.2 C), temperature source Axillary, resp. rate 18, height 5\' 3"  (1.6 m), weight 55 kg, last menstrual period 11/02/2018, SpO2 100 %.  Physical Exam  Constitutional: She is oriented to person, place, and time. She appears well-developed.  HENT:  Head: Normocephalic.  Neck: Normal range of motion.  GI: Soft.  Neurological: She is alert and oriented to person, place, and time.  Skin: Skin is warm and dry.  Very distressed; asking for help and water.   Results for orders placed or performed during the hospital encounter of 12/15/18 (from the past 24 hour(s))  Urine rapid drug screen (hosp performed)     Status: Abnormal   Collection Time: 12/15/18  4:16 AM  Result Value Ref Range   Opiates NONE DETECTED NONE DETECTED   Cocaine NONE DETECTED NONE DETECTED   Benzodiazepines NONE DETECTED NONE DETECTED   Amphetamines NONE DETECTED NONE DETECTED  Tetrahydrocannabinol POSITIVE (A) NONE DETECTED   Barbiturates NONE DETECTED NONE DETECTED  Urinalysis, Complete w Microscopic     Status: Abnormal   Collection Time: 12/15/18  4:16 AM  Result Value Ref Range   Color, Urine AMBER (A) YELLOW   APPearance HAZY (A) CLEAR   Specific Gravity, Urine 1.036 (H) 1.005 - 1.030   pH 5.0 5.0 - 8.0   Glucose, UA 50 (A) NEGATIVE mg/dL   Hgb urine dipstick NEGATIVE NEGATIVE   Bilirubin Urine SMALL (A) NEGATIVE   Ketones, ur 5 (A) NEGATIVE mg/dL   Protein, ur 161100 (A) NEGATIVE mg/dL   Nitrite POSITIVE (A) NEGATIVE   Leukocytes,Ua SMALL (A) NEGATIVE   RBC / HPF 0-5 0 - 5 RBC/hpf   WBC, UA 0-5 0 - 5 WBC/hpf   Bacteria, UA NONE SEEN NONE SEEN   Squamous Epithelial / LPF 6-10 0 - 5   Mucus PRESENT   CBC     Status: Abnormal   Collection Time: 12/15/18  5:25 AM  Result  Value Ref Range   WBC 14.4 (H) 4.0 - 10.5 K/uL   RBC 4.26 3.87 - 5.11 MIL/uL   Hemoglobin 12.0 12.0 - 15.0 g/dL   HCT 09.635.0 (L) 04.536.0 - 40.946.0 %   MCV 82.2 80.0 - 100.0 fL   MCH 28.2 26.0 - 34.0 pg   MCHC 34.3 30.0 - 36.0 g/dL   RDW 81.113.3 91.411.5 - 78.215.5 %   Platelets 295 150 - 400 K/uL   nRBC 0.0 0.0 - 0.2 %  Comprehensive metabolic panel     Status: Abnormal   Collection Time: 12/15/18  5:25 AM  Result Value Ref Range   Sodium 138 135 - 145 mmol/L   Potassium 2.9 (L) 3.5 - 5.1 mmol/L   Chloride 98 98 - 111 mmol/L   CO2 26 22 - 32 mmol/L   Glucose, Bld 127 (H) 70 - 99 mg/dL   BUN 8 6 - 20 mg/dL   Creatinine, Ser 9.560.61 0.44 - 1.00 mg/dL   Calcium 9.6 8.9 - 21.310.3 mg/dL   Total Protein 6.9 6.5 - 8.1 g/dL   Albumin 4.1 3.5 - 5.0 g/dL   AST 29 15 - 41 U/L   ALT 34 0 - 44 U/L   Alkaline Phosphatase 56 38 - 126 U/L   Total Bilirubin 0.9 0.3 - 1.2 mg/dL   GFR calc non Af Amer >60 >60 mL/min   GFR calc Af Amer >60 >60 mL/min   Anion gap 14 5 - 15   MAU Course  Procedures  MDM -UA shows ketones and protein; will start IV with phenergan, give Pepcid and Zofran as that helped yesterday.  -Scop patch in place -UDS positive for THC; patient denies using in the past two weeks.  -repeat CBC and CMP -UA: positive for nitrites, protein; will send for culture  Care of patient endorsed to oncoming CNM at 0819 Charlesetta GaribaldiKathryn Lorraine Kooistra 12/15/2018  0920: Pt feeling better, resting, no further emesis. Tolerating po fluids and crackers. Will add Pepcid, Phenergan supp and Scopolamine to regimen and replete K. Stable for discharge home.  Assessment and Plan   1. Morning sickness   2. Hypokalemia    [facility] Scopolamine Rx Phenergan supp Rx Kdur Rx pepcid  Allergies as of 12/15/2018      Reactions   Pollen Extract Hives      Medication List    STOP taking these medications   promethazine 25 MG  tablet Commonly known as:  PHENERGAN Replaced by:   promethazine 25 MG suppository You also have another medication with the same name that you need to continue taking as instructed.     TAKE these medications   famotidine 20 MG tablet Commonly known as:  PEPCID Take 1 tablet (20 mg total) by mouth at bedtime.   Potassium Chloride ER 20 MEQ Tbcr Take 40 mEq by mouth 2 (two) times daily.   promethazine 12.5 MG tablet Commonly known as:  PHENERGAN Take 1-2 tablets (12.5-25 mg total) by mouth every 6 (six) hours as needed for nausea or vomiting. What changed:    how much to take  Another medication with the same name was removed. Continue taking this medication, and follow the directions you see here.   promethazine 25 MG suppository Commonly known as:  PHENERGAN Place 1 suppository (25 mg total) rectally every 6 (six) hours as needed for nausea or vomiting. What changed:  You were already taking a medication with the same name, and this prescription was added. Make sure you understand how and when to take each. Replaces:  promethazine 25 MG tablet   scopolamine 1 MG/3DAYS Commonly known as:  TRANSDERM-SCOP Place 1 patch (1.5 mg total) onto the skin every 3 (three) days.       Donette Larry, CNM  12/15/2018 10:30 AM

## 2018-12-17 LAB — CULTURE, OB URINE: Culture: >=100000 — AB

## 2018-12-18 ENCOUNTER — Telehealth: Payer: Self-pay

## 2018-12-18 ENCOUNTER — Other Ambulatory Visit: Payer: Self-pay

## 2018-12-18 ENCOUNTER — Inpatient Hospital Stay (HOSPITAL_COMMUNITY)
Admission: AD | Admit: 2018-12-18 | Discharge: 2018-12-18 | Disposition: A | Discharge status: Home / Self Care | Payer: Medicaid Other | Attending: Obstetrics and Gynecology | Admitting: Obstetrics and Gynecology

## 2018-12-18 ENCOUNTER — Encounter (HOSPITAL_COMMUNITY): Payer: Self-pay | Admitting: *Deleted

## 2018-12-18 DIAGNOSIS — O210 Mild hyperemesis gravidarum: Secondary | ICD-10-CM | POA: Insufficient documentation

## 2018-12-18 DIAGNOSIS — Z79899 Other long term (current) drug therapy: Secondary | ICD-10-CM | POA: Insufficient documentation

## 2018-12-18 DIAGNOSIS — O219 Vomiting of pregnancy, unspecified: Secondary | ICD-10-CM | POA: Diagnosis not present

## 2018-12-18 DIAGNOSIS — Z3A01 Less than 8 weeks gestation of pregnancy: Secondary | ICD-10-CM

## 2018-12-18 DIAGNOSIS — Z87891 Personal history of nicotine dependence: Secondary | ICD-10-CM | POA: Diagnosis not present

## 2018-12-18 LAB — URINALYSIS, ROUTINE W REFLEX MICROSCOPIC
Bilirubin Urine: NEGATIVE
Glucose, UA: NEGATIVE mg/dL
Hgb urine dipstick: NEGATIVE
Ketones, ur: 20 mg/dL — AB
Nitrite: NEGATIVE
Protein, ur: 100 mg/dL — AB
Specific Gravity, Urine: 1.031 — ABNORMAL HIGH (ref 1.005–1.030)
pH: 5 (ref 5.0–8.0)

## 2018-12-18 MED ORDER — DOXYLAMINE-PYRIDOXINE ER 20-20 MG PO TBCR: 1.0000 | EXTENDED_RELEASE_TABLET | Freq: Every day | ORAL | 1 refills | Status: AC

## 2018-12-18 MED ORDER — CEPHALEXIN 500 MG PO CAPS: 500.0000 mg | ORAL_CAPSULE | Freq: Two times a day (BID) | ORAL | 0 refills | Status: DC

## 2018-12-18 MED ORDER — METOCLOPRAMIDE HCL 5 MG/ML IJ SOLN
10.0000 mg | Freq: Once | INTRAMUSCULAR | Status: AC
  Administered 2018-12-18: 10 mg via INTRAVENOUS
  Filled 2018-12-18: qty 2

## 2018-12-18 MED ORDER — LACTATED RINGERS IV BOLUS
1000.0000 mL | Freq: Once | INTRAVENOUS | Status: AC
  Administered 2018-12-18: 15:00:00 1000 mL via INTRAVENOUS

## 2018-12-18 MED ORDER — FAMOTIDINE IN NACL 20-0.9 MG/50ML-% IV SOLN
20.0000 mg | Freq: Once | INTRAVENOUS | Status: AC
  Administered 2018-12-18: 20 mg via INTRAVENOUS
  Filled 2018-12-18: qty 50

## 2018-12-18 NOTE — MAU Note (Signed)
[name] is a 33 y.o. at [redacted]w[redacted]d here in MAU reporting:vomiting and not able to keep anything down, Was given prescription for phenergan and scop patch  on her last visit and it only puts her to sleep  Onset of complaint: 1 week  Pain score: 0 Vitals:   12/18/18 1247  BP: 116/86  Pulse: (!) 115  Resp: 18  Temp: 98.3 F (36.8 C)  SpO2: 100%      Lab orders placed from triage: UA

## 2018-12-18 NOTE — Telephone Encounter (Signed)
[id number] [name] 1985/11/14 [id number]   Patient called and verified her identity via birth date and last 4 of her SSN.  Patient agreeable to results via phone and was informed of positive urine culture.  Patient denies drug allergies and informed Rx for Keflex sent to pharmacy on file.  Patient questions regarding how she would have acquired UTI addressed.  No other questions or concerns.  Patient instructed to follow up with any other questions, concerns, or changes in symptoms as appropriate.  Cherre Robins MSN, CNM 12/18/2018 9:10 AM   **This phone was completed, in its entirety, via telehealth communications.  I personally spent >/=2 minutes on the phone providing recommendations, education, and guidance.**

## 2018-12-18 NOTE — MAU Provider Note (Signed)
History     CSN: [id number]  Arrival date and time: 12/18/18 1230   First Provider Initiated Contact with Patient 12/18/18 1305      Chief Complaint  Patient presents with  . Morning Sickness   [name] is a 33 y.o. [id number] at 8150w4d who presents to MAU for N/V. Of note, pt has been seen in MAU on 12/13/2018 & 12/15/2018 for this same concern.  Onset: 12/13/2018 Location: stomach Duration: 5days Character: constant nausea, when asked about how many times a day she vomits, pt reports "I can't even tell you, too many" Aggravating/Associated: none/none Relieving: none Treatment: Pt has been prescribed a scopolamine patch, phenergan, Pepcid and KDur. Pt reports she stopped all those medications last night and purchased some "Peyton NajjarKim Kardashian pregnancy pills" (which she later clarified were Diclegis) from a friend. Pt reports her friend advised her not to take other medications along with the Diclegis. Pt reports she took her first dose of two pills last night and only vomited once this morning. Pt reports on her way into MAU she smelled food and thought about going to eat, and if she was able to keep it down not coming to MAU for treatment.  Of note, pt was not vomiting during discussion with MAU provider and did not have an emesis bag nearby.  Pt denies VB, LOF, ctx, decreased FM, vaginal discharge/odor/itching. Pt denies N/V, abdominal pain, constipation, diarrhea, or urinary problems. Pt denies fever, chills, fatigue, sweating or changes in appetite. Pt denies SOB or chest pain. Pt denies dizziness, HA, light-headedness, weakness.  Problems this pregnancy include: persistent N/V. Allergies? NKDA Current medications/supplements? Pepcid, Diclegis Prenatal care provider? [facility], next appt 01/15/2019   OB History    Gravida  5   Para  3   Term  3   Preterm      AB  1   Living  3     SAB  1   TAB      Ectopic      Multiple      Live Births  3            Past Medical History:  Diagnosis Date  . History of reversal of tubal ligation 09/2018  . Medical history non-contributory     Past Surgical History:  Procedure Laterality Date  . TUBAL LIGATION  2008  . Tubal reversal  09/2018    Family History  Problem Relation Age of Onset  . Healthy Mother   . Healthy Father     Social History   Tobacco Use  . Smoking status: Former Smoker    Packs/day: 1.00    Types: Cigarettes  . Smokeless tobacco: Never Used  Substance Use Topics  . Alcohol use: No  . Drug use: No    Allergies:  Allergies  Allergen Reactions  . Pollen Extract Hives    Medications Prior to Admission  Medication Sig Dispense Refill Last Dose  . famotidine (PEPCID) 20 MG tablet Take 1 tablet (20 mg total) by mouth at bedtime. 30 tablet 2 12/17/2018 at Unknown time  . potassium chloride 20 MEQ TBCR Take 40 mEq by mouth 2 (two) times daily. 20 tablet 0 12/17/2018 at Unknown time  . promethazine (PHENERGAN) 12.5 MG tablet Take 1-2 tablets (12.5-25 mg total) by mouth every 6 (six) hours as needed for nausea or vomiting. 30 tablet 0 12/17/2018 at Unknown time  . scopolamine (TRANSDERM-SCOP) 1 MG/3DAYS Place 1 patch (1.5 mg total) onto the skin every  3 (three) days. 10 patch 1 12/17/2018 at Unknown time  . cephALEXin (KEFLEX) 500 MG capsule Take 1 capsule (500 mg total) by mouth 2 (two) times daily. 14 capsule 0   . promethazine (PHENERGAN) 25 MG suppository Place 1 suppository (25 mg total) rectally every 6 (six) hours as needed for nausea or vomiting. 12 each 1     Review of Systems  Constitutional: Negative for chills, diaphoresis, fatigue and fever.  Respiratory: Negative for shortness of breath.   Cardiovascular: Negative for chest pain.  Gastrointestinal: Positive for nausea and vomiting. Negative for abdominal pain, constipation and diarrhea.  Genitourinary: Negative for dysuria, flank pain, frequency, pelvic pain, urgency, vaginal bleeding and vaginal  discharge.  Neurological: Negative for dizziness, weakness, light-headedness and headaches.   Physical Exam   Blood pressure 125/65, pulse (!) 115, temperature 98.3 F (36.8 C), resp. rate 18, last menstrual period 11/02/2018, SpO2 100 %.  Patient Vitals for the past 24 hrs:  BP Temp Pulse Resp SpO2  12/18/18 1613 125/65 - - - -  12/18/18 1247 116/86 98.3 F (36.8 C) (!) 115 18 100 %   Physical Exam  Constitutional: She is oriented to person, place, and time. She appears well-developed and well-nourished. No distress.  HENT:  Head: Normocephalic and atraumatic.  Respiratory: Effort normal.  Neurological: She is alert and oriented to person, place, and time.  Skin: She is not diaphoretic.  Psychiatric: She has a normal mood and affect. Her behavior is normal. Judgment and thought content normal.   Results for orders placed or performed during the hospital encounter of 12/18/18 (from the past 24 hour(s))  Urinalysis, Routine w reflex microscopic     Status: Abnormal   Collection Time: 12/18/18 12:46 PM  Result Value Ref Range   Color, Urine AMBER (A) YELLOW   APPearance CLOUDY (A) CLEAR   Specific Gravity, Urine 1.031 (H) 1.005 - 1.030   pH 5.0 5.0 - 8.0   Glucose, UA NEGATIVE NEGATIVE mg/dL   Hgb urine dipstick NEGATIVE NEGATIVE   Bilirubin Urine NEGATIVE NEGATIVE   Ketones, ur 20 (A) NEGATIVE mg/dL   Protein, ur 829 (A) NEGATIVE mg/dL   Nitrite NEGATIVE NEGATIVE   Leukocytes,Ua MODERATE (A) NEGATIVE   RBC / HPF 0-5 0 - 5 RBC/hpf   WBC, UA 6-10 0 - 5 WBC/hpf   Bacteria, UA MANY (A) NONE SEEN   Squamous Epithelial / LPF 0-5 0 - 5   Mucus PRESENT     No results found.  MAU Course  Procedures  MDM -N/V in pregnancy -PO challenge unsuccessful - pt reports she attempted to eat/drink and threw it back up immediately -UA: amber/cloudy/SG 1.031/20ketones/100PRO/mod leuks/many bacteria, pt was recently dx with a UTI and started on ABX this morning -1L LR given with Reglan  10mg  and Pepcid 20mg , pt declines Zofran after discussion of risks and benefits -after IV fluids and medications, pt reports N/V improved and pt was sleeping upon provider entering the room -PO challenge unsuccessful, pt reports she vomited everything up, but after discussion with the provider states "I just want to go home" and declines additional treatment at this time; discussed with pt importance of controlling N/V of pregnancy to a manageable level for health of pt and baby and ability to take other medications to treat co-morbidities that, if left untreated, could result in serious sequelae and subsequent hospitalization, but pt continues to decline additional treatment -pt discharged to home  Orders Placed This Encounter  Procedures  . Urinalysis, Routine  w reflex microscopic    Standing Status:   Standing    Number of Occurrences:   1  . Insert peripheral IV    Standing Status:   Standing    Number of Occurrences:   1  . Discharge patient    Order Specific Question:   Discharge disposition    Answer:   01-Home or Self Care [1]    Order Specific Question:   Discharge patient date    Answer:   12/18/2018   Meds ordered this encounter  Medications  . Doxylamine-Pyridoxine ER (BONJESTA) 20-20 MG TBCR    Sig: Take 1 tablet by mouth at bedtime for 30 days. Take 1 tablet, daily, by mouth, at bedtime. If needed, may also take 1 tablet in the morning and 1 tablet at bedtime. Do NOT take more than 2 tablets per day.    Dispense:  30 tablet    Refill:  1    Order Specific Question:   Supervising Provider    Answer:   [place] [id number]  . lactated ringers bolus 1,000 mL  . famotidine (PEPCID) IVPB 20 mg premix  . metoCLOPramide (REGLAN) injection 10 mg   Assessment and Plan   1. Nausea and vomiting in pregnancy   2. [redacted] weeks gestation of pregnancy    Allergies as of 12/18/2018      Reactions   Pollen Extract Hives      Medication List    STOP taking these medications    promethazine 12.5 MG tablet Commonly known as:  PHENERGAN   promethazine 25 MG suppository Commonly known as:  PHENERGAN   scopolamine 1 MG/3DAYS Commonly known as:  TRANSDERM-SCOP     TAKE these medications   cephALEXin 500 MG capsule Commonly known as:  Keflex Take 1 capsule (500 mg total) by mouth 2 (two) times daily.   Doxylamine-Pyridoxine ER 20-20 MG Tbcr Commonly known as:  Bonjesta Take 1 tablet by mouth at bedtime for 30 days. Take 1 tablet, daily, by mouth, at bedtime. If needed, may also take 1 tablet in the morning and 1 tablet at bedtime. Do NOT take more than 2 tablets per day.   famotidine 20 MG tablet Commonly known as:  PEPCID Take 1 tablet (20 mg total) by mouth at bedtime.   Potassium Chloride ER 20 MEQ Tbcr Take 40 mEq by mouth 2 (two) times daily.      -continue Pepcid, Keflex and KDur, as previously prescribed -RX for [employer] and instructions for Unisom/B6 given - pt aware to take EITHER Bonjesta or Unisom/B6 combo; RX given as pt reports other medications were not working for her, but felt better after taking Diclegis she purchased from a friend -discussed realistic expectations of N/V in pregnancy -discussed pharmacologic and non-pharmacologic options for treating N/V in pregnancy -message sent to [facility] to schedule NOB appt sooner than 01/15/2019 in light of repeat visits to MAU -strict hyperemesis/pyelonephritis/return MAU precautions given -pt discharged to home  Joni Reining E  12/18/2018, 4:20 PM

## 2018-12-18 NOTE — Discharge Instructions (Signed)
For nausea and vomiting can either use the below OR pick-up the prescription for Bonjesta: Unisom 12.5 mg at bedtime Vitamin B6 25mg  at bedtime and in the morning. (can take every 6-8 hours, if needed with a maximum dose of 200mg  per day)    Morning Sickness  Morning sickness is when a woman feels nauseous during pregnancy. This nauseous feeling may or may not come with vomiting. It often occurs in the morning, but it can be a problem at any time of day. Morning sickness is most common during the first trimester. In some cases, it may continue throughout pregnancy. Although morning sickness is unpleasant, it is usually harmless unless the woman develops severe and continual vomiting (hyperemesis gravidarum), a condition that requires more intense treatment. What are the causes? The exact cause of this condition is not known, but it seems to be related to normal hormonal changes that occur in pregnancy. What increases the risk? You are more likely to develop this condition if:  You experienced nausea or vomiting before your pregnancy.  You had morning sickness during a previous pregnancy.  You are pregnant with more than one baby, such as twins. What are the signs or symptoms? Symptoms of this condition include:  Nausea.  Vomiting. How is this diagnosed? This condition is usually diagnosed based on your signs and symptoms. How is this treated? In many cases, treatment is not needed for this condition. Making some changes to what you eat may help to control symptoms. Your health care provider may also prescribe or recommend:  Vitamin B6 supplements.  Anti-nausea medicines.  Ginger. Follow these instructions at home: Medicines  Take over-the-counter and prescription medicines only as told by your health care provider. Do not use any prescription, over-the-counter, or herbal medicines for morning sickness without first talking with your health care provider.  Taking multivitamins  before getting pregnant can prevent or decrease the severity of morning sickness in most women. Eating and drinking  Eat a piece of dry toast or crackers before getting out of bed in the morning.  Eat 5 or 6 small meals a day.  Eat dry and bland foods, such as rice or a baked potato. Foods that are high in carbohydrates are often helpful.  Avoid greasy, fatty, and spicy foods.  Have someone cook for you if the smell of any food causes nausea and vomiting.  If you feel nauseous after taking prenatal vitamins, take the vitamins at night or with a snack.  Snack on protein foods between meals if you are hungry. Nuts, yogurt, and cheese are good options.  Drink fluids throughout the day.  Try ginger ale made with real ginger, ginger tea made from fresh grated ginger, or ginger candies. General instructions  Do not use any products that contain nicotine or tobacco, such as cigarettes and e-cigarettes. If you need help quitting, ask your health care provider.  Get an air purifier to keep the air in your house free of odors.  Get plenty of fresh air.  Try to avoid odors that trigger your nausea.  Consider trying these methods to help relieve symptoms: ? Wearing an acupressure wristband. These wristbands are often worn for seasickness. ? Acupuncture. Contact a health care provider if:  Your home remedies are not working and you need medicine.  You feel dizzy or light-headed.  You are losing weight. Get help right away if:  You have persistent and uncontrolled nausea and vomiting.  You faint.  You have severe pain in your  abdomen. Summary  Morning sickness is when a woman feels nauseous during pregnancy. This nauseous feeling may or may not come with vomiting.  Morning sickness is most common during the first trimester.  It often occurs in the morning, but it can be a problem at any time of day.  In many cases, treatment is not needed for this condition. Making some  changes to what you eat may help to control symptoms. This information is not intended to replace advice given to you by your health care provider. Make sure you discuss any questions you have with your health care provider. Document Released: 08/30/2006 Document Revised: 08/11/2016 Document Reviewed: 08/11/2016 Elsevier Interactive Patient Education  2019 [employer].

## 2019-01-14 DIAGNOSIS — O0990 Supervision of high risk pregnancy, unspecified, unspecified trimester: Secondary | ICD-10-CM | POA: Insufficient documentation

## 2019-01-14 DIAGNOSIS — Z3490 Encounter for supervision of normal pregnancy, unspecified, unspecified trimester: Secondary | ICD-10-CM | POA: Insufficient documentation

## 2019-01-15 ENCOUNTER — Other Ambulatory Visit: Payer: Self-pay

## 2019-01-15 ENCOUNTER — Encounter: Payer: Medicaid Other | Admitting: Certified Nurse Midwife

## 2019-01-15 ENCOUNTER — Inpatient Hospital Stay (HOSPITAL_COMMUNITY)
Admission: AD | Admit: 2019-01-15 | Discharge: 2019-01-15 | Discharge status: Against Medical Advice | Payer: Medicaid Other | Attending: Obstetrics and Gynecology | Admitting: Obstetrics and Gynecology

## 2019-01-15 ENCOUNTER — Other Ambulatory Visit: Payer: Self-pay | Admitting: Certified Nurse Midwife

## 2019-01-15 DIAGNOSIS — Z3480 Encounter for supervision of other normal pregnancy, unspecified trimester: Secondary | ICD-10-CM

## 2019-01-15 DIAGNOSIS — Z5321 Procedure and treatment not carried out due to patient leaving prior to being seen by health care provider: Secondary | ICD-10-CM | POA: Insufficient documentation

## 2019-01-15 DIAGNOSIS — Z9851 Tubal ligation status: Secondary | ICD-10-CM

## 2019-01-15 MED ORDER — ONDANSETRON 8 MG PO TBDP: 8.0000 mg | ORAL_TABLET | Freq: Three times a day (TID) | ORAL | 1 refills | Status: DC | PRN

## 2019-01-15 MED ORDER — PREPLUS 27-1 MG PO TABS: 1.0000 | ORAL_TABLET | Freq: Every day | ORAL | 13 refills | Status: DC

## 2019-01-15 NOTE — MAU Note (Signed)
Pt came out of triage stating that she wants to know how much longer it is going to be seen. States she needs to go home and take her nausea medication. Told her provider was tied up with an emergency and would be with her shortly. Asked pt if she wanted something for nausea and she says "no I just want to know how much longer it is going be." Pt went back into triage. Went to check on patient 5 minutes later to give her an update and patient had left without telling anyone. Provider was made aware of situation.

## 2019-01-15 NOTE — MAU Note (Signed)
Pt reports she was seen at the dr office today. States she had a tubal reversal on March 12th and was told to come in for ultrasound. Pt denies pain or vaginal bleeding. LMP: in April.

## 2019-01-17 ENCOUNTER — Encounter (HOSPITAL_COMMUNITY): Payer: Self-pay

## 2019-01-17 ENCOUNTER — Other Ambulatory Visit: Payer: Self-pay

## 2019-01-17 ENCOUNTER — Inpatient Hospital Stay (HOSPITAL_COMMUNITY)
Admission: AD | Admit: 2019-01-17 | Discharge: 2019-01-17 | Disposition: A | Discharge status: Home / Self Care | Payer: Medicaid Other | Source: Ambulatory Visit | Attending: Obstetrics and Gynecology | Admitting: Obstetrics and Gynecology

## 2019-01-17 DIAGNOSIS — O2341 Unspecified infection of urinary tract in pregnancy, first trimester: Secondary | ICD-10-CM

## 2019-01-17 DIAGNOSIS — O99321 Drug use complicating pregnancy, first trimester: Secondary | ICD-10-CM | POA: Diagnosis not present

## 2019-01-17 DIAGNOSIS — Z87891 Personal history of nicotine dependence: Secondary | ICD-10-CM | POA: Insufficient documentation

## 2019-01-17 DIAGNOSIS — F12188 Cannabis abuse with other cannabis-induced disorder: Secondary | ICD-10-CM | POA: Diagnosis not present

## 2019-01-17 DIAGNOSIS — Z3A10 10 weeks gestation of pregnancy: Secondary | ICD-10-CM | POA: Diagnosis not present

## 2019-01-17 DIAGNOSIS — F12988 Cannabis use, unspecified with other cannabis-induced disorder: Secondary | ICD-10-CM | POA: Diagnosis not present

## 2019-01-17 DIAGNOSIS — O210 Mild hyperemesis gravidarum: Secondary | ICD-10-CM | POA: Diagnosis present

## 2019-01-17 DIAGNOSIS — F1290 Cannabis use, unspecified, uncomplicated: Secondary | ICD-10-CM

## 2019-01-17 LAB — URINALYSIS, ROUTINE W REFLEX MICROSCOPIC
Glucose, UA: NEGATIVE mg/dL
Ketones, ur: 20 mg/dL — AB
Nitrite: POSITIVE — AB
Protein, ur: >=300 mg/dL — AB
RBC / HPF: >50 RBC/hpf — ABNORMAL HIGH (ref 0–5)
Specific Gravity, Urine: 1.031 — ABNORMAL HIGH (ref 1.005–1.030)
WBC, UA: >50 WBC/hpf — ABNORMAL HIGH (ref 0–5)
pH: 5 (ref 5.0–8.0)

## 2019-01-17 LAB — RAPID URINE DRUG SCREEN, HOSP PERFORMED
Amphetamines: NOT DETECTED
Barbiturates: NOT DETECTED
Benzodiazepines: NOT DETECTED
Cocaine: NOT DETECTED
Opiates: NOT DETECTED
Tetrahydrocannabinol: POSITIVE — AB

## 2019-01-17 MED ORDER — PROMETHAZINE HCL 25 MG RE SUPP
25.0000 mg | Freq: Once | RECTAL | Status: AC
  Administered 2019-01-17: 25 mg via RECTAL
  Filled 2019-01-17: qty 1

## 2019-01-17 MED ORDER — ONDANSETRON 4 MG PO TBDP
8.0000 mg | ORAL_TABLET | Freq: Once | ORAL | Status: AC
  Administered 2019-01-17: 8 mg via ORAL
  Filled 2019-01-17: qty 2

## 2019-01-17 MED ORDER — CEPHALEXIN 500 MG PO CAPS: 500.0000 mg | ORAL_CAPSULE | Freq: Two times a day (BID) | ORAL | 0 refills | Status: DC

## 2019-01-17 NOTE — MAU Note (Addendum)
Pt reports N/V x3 days, but worse yesterday. Was RX'd Zofran yesterday, last took around 9-10pm last night. Pt reports it is not helping and that she has not been able to keep anything down. Reports upper abdominal pain that she thinks is from vomiting. Smoked marijuana 3-4 days ago that she reports "didn't help, but didn't make my nausea worse."

## 2019-01-17 NOTE — MAU Provider Note (Signed)
History     CSN: [id number]  Arrival date and time: 01/17/19 0343   First Provider Initiated Contact with Patient 01/17/19 [phone]      Chief Complaint  Patient presents with  . Emesis   [name] is a 33 y.o. [id number] at 5349w6d who receives care at [facility].  She presents today for Emesis.  She states she has been having nausea/vomiting for the past 3 days without relief.  She states she has thrown up about 6 times yesterday. She reports marijuana usage 3 days ago, but does not feel like her symptoms are related to usage.  Patient states that she feels relief of symptoms when in the shower or sleeping, but otherwise feels nauseous.  Patient denies vaginal concerns including discharge, leaking, and bleeding.       OB History    Gravida  5   Para  3   Term  3   Preterm      AB  1   Living  3     SAB  1   TAB      Ectopic      Multiple      Live Births  3           Past Medical History:  Diagnosis Date  . History of reversal of tubal ligation 09/2018  . Medical history non-contributory     Past Surgical History:  Procedure Laterality Date  . TUBAL LIGATION  2008  . Tubal reversal  09/2018    Family History  Problem Relation Age of Onset  . Healthy Mother   . Healthy Father     Social History   Tobacco Use  . Smoking status: Former Smoker    Packs/day: 1.00    Types: Cigarettes  . Smokeless tobacco: Never Used  Substance Use Topics  . Alcohol use: No  . Drug use: Yes    Types: Marijuana    Allergies:  Allergies  Allergen Reactions  . Pollen Extract Hives    Medications Prior to Admission  Medication Sig Dispense Refill Last Dose  . ondansetron (ZOFRAN ODT) 8 MG disintegrating tablet Take 1 tablet (8 mg total) by mouth every 8 (eight) hours as needed for nausea or vomiting. 30 tablet 1 01/16/2019 at Unknown time  . cephALEXin (KEFLEX) 500 MG capsule Take 1 capsule (500 mg total) by mouth 2 (two) times daily. 14 capsule 0   .  Doxylamine-Pyridoxine ER (BONJESTA) 20-20 MG TBCR Take 1 tablet by mouth at bedtime for 30 days. Take 1 tablet, daily, by mouth, at bedtime. If needed, may also take 1 tablet in the morning and 1 tablet at bedtime. Do NOT take more than 2 tablets per day. 30 tablet 1   . famotidine (PEPCID) 20 MG tablet Take 1 tablet (20 mg total) by mouth at bedtime. 30 tablet 2   . potassium chloride 20 MEQ TBCR Take 40 mEq by mouth 2 (two) times daily. 20 tablet 0   . Prenatal Vit-Fe Fumarate-FA (PREPLUS) 27-1 MG TABS Take 1 tablet by mouth daily. 30 tablet 13     Review of Systems  Constitutional: Negative for chills and fever.  Gastrointestinal: Positive for abdominal pain. Negative for constipation, diarrhea, nausea and vomiting.  Genitourinary: Negative for difficulty urinating, dysuria, vaginal bleeding and vaginal discharge.  Musculoskeletal: Negative for back pain.  Neurological: Negative for dizziness, light-headedness and headaches.   Physical Exam   Blood pressure 131/81, pulse 84, temperature 98.3 F (36.8 C), resp. rate 20,  height 5\' 3"  (1.6 m), weight 55.6 kg, last menstrual period 11/02/2018.  Physical Exam  Constitutional: She is oriented to person, place, and time. She appears well-developed and well-nourished. She appears distressed (Mild).  HENT:  Head: Normocephalic and atraumatic.  Eyes: Conjunctivae are normal.  Neck: Normal range of motion.  Cardiovascular: Normal rate, regular rhythm and normal heart sounds.  Respiratory: Effort normal and breath sounds normal.  GI: Soft.  Musculoskeletal: Normal range of motion.  Neurological: She is alert and oriented to person, place, and time.  Skin: Skin is warm and dry.  Psychiatric: She has a normal mood and affect. Her behavior is normal.    MAU Course  Procedures Results for orders placed or performed during the hospital encounter of 01/17/19 (from the past 24 hour(s))  Urinalysis, Routine w reflex microscopic     Status:  Abnormal   Collection Time: 01/17/19  4:08 AM  Result Value Ref Range   Color, Urine AMBER (A) YELLOW   APPearance CLOUDY (A) CLEAR   Specific Gravity, Urine 1.031 (H) 1.005 - 1.030   pH 5.0 5.0 - 8.0   Glucose, UA NEGATIVE NEGATIVE mg/dL   Hgb urine dipstick MODERATE (A) NEGATIVE   Bilirubin Urine SMALL (A) NEGATIVE   Ketones, ur 20 (A) NEGATIVE mg/dL   Protein, ur >=132>=300 (A) NEGATIVE mg/dL   Nitrite POSITIVE (A) NEGATIVE   Leukocytes,Ua SMALL (A) NEGATIVE   RBC / HPF >50 (H) 0 - 5 RBC/hpf   WBC, UA >50 (H) 0 - 5 WBC/hpf   Bacteria, UA MANY (A) NONE SEEN   Squamous Epithelial / LPF 6-10 0 - 5   Mucus PRESENT    Hyaline Casts, UA PRESENT    Non Squamous Epithelial 0-5 (A) NONE SEEN  Rapid urine drug screen (hospital performed)     Status: Abnormal   Collection Time: 01/17/19  4:08 AM  Result Value Ref Range   Opiates NONE DETECTED NONE DETECTED   Cocaine NONE DETECTED NONE DETECTED   Benzodiazepines NONE DETECTED NONE DETECTED   Amphetamines NONE DETECTED NONE DETECTED   Tetrahydrocannabinol POSITIVE (A) NONE DETECTED   Barbiturates NONE DETECTED NONE DETECTED    MDM PE Oral Hydration Antiemetic Assessment and Plan  33 year old G5P3013 at 10.6 week Cannabinoid Hyperemesis  -Exam findings discussed. -Extensive discussion regarding cessation of marijuana usage, without relapse, to avoid continued nausea/vomiting. -Educated on cannabinoid related hyperemesis and its effects. -Patient given realistic expectations regarding nausea and vomiting in pregnancy.  Informed that goal is to be functional, but will still experience bouts of nausea/vomiting.  -Discussed diet to improve symptoms of nausea/vomiting including bland diet, frequent high protein snacks, frequent small sips of fluids, and possible protein drinks/shakes.  -Questions and concerns addressed. -Patient agreeable to oral antiemetic. -Will give Zofran 8mg  ODT and Phenergan 25mg  suppository -Patient requests  fluids-will push oral fluids. -Will reassess.  Follow Up (5:30 AM) UTI  -Patient reports improvement in nausea with medication dosing. -Informed of urine results and medication. -Will send Keflex 500mg  BID Disp 14, RF 0 sent to pharmacy on file.  -Discussed medication regime to assist in decreasing N/V symptoms. -Patient states she will take Zofran and reports having phenergan at home, but only 12.5mg . -Informed that she could take 2 tablets if necessary.  Also discussed alternative routes of administration. -Patient without further questions or concerns. -Encouraged to call or return to MAU if symptoms worsen or with the onset of new symptoms. -Discharged to home in improved condition  Petrice Beedy L  [facility] MSN, CNM 01/17/2019, 4:42 AM

## 2019-01-17 NOTE — Discharge Instructions (Signed)
Cannabinoid Hyperemesis Syndrome °Cannabinoid hyperemesis syndrome (CHS) is a condition that causes repeated nausea, vomiting, and abdominal pain after long-term (chronic) use of marijuana (cannabis). People with CHS typically use marijuana 3-5 times a day for many years before they have symptoms, although it is possible to develop CHS with as little as 1 use per day. °Symptoms of CHS may be mild at first but can get worse and more frequent. In some cases, CHS may cause vomiting many times a day, which can lead to weight loss and dehydration. CHS may go away and come back many times (recur). People may not have symptoms or may otherwise be healthy in between CHS attacks. °What are the causes? °The exact cause of this condition is not known. Long-term use of marijuana may over-stimulate certain proteins in the brain that react with chemicals in marijuana (cannabinoid receptors). This over-stimulation may cause CHS. °What are the signs or symptoms? °Symptoms of this condition are often mild during the first few attacks, but they can get worse over time. Symptoms may include: °· Frequent nausea, especially early in the morning. °· Vomiting. °· Abdominal pain. °Taking several hot showers throughout the day can also be a sign of this condition. People with CHS may do this because it relieves symptoms. °How is this diagnosed? °This condition may be diagnosed based on: °· Your symptoms and medical history, including any drug use. °· A physical exam. °You may have tests done to rule out other problems. These tests may include: °· Blood tests. °· Urine tests. °· Imaging tests, such as an X-ray or CT scan. °How is this treated? °Treatment for this condition involves stopping marijuana use. Your health care provider may recommend: °· A drug rehabilitation program, if you have trouble stopping marijuana use. °· Medicines for nausea. °· Hot showers to help relieve symptoms. °Certain creams that contain a substance called  capsaicin may improve symptoms when applied to the abdomen. Ask your health care provider before starting any medicines or other treatments. °Severe nausea and vomiting may require you to stay at the hospital. You may need IV fluids to prevent or treat dehydration. You may also need certain medicines that must be given at the hospital. °Follow these instructions at home: °During an attack ° °· Stay in bed and rest in a dark, quiet room. °· Take anti-nausea medicine as told by your health care provider. °· Try taking hot showers to relieve your symptoms. °After an attack °· Drink small amounts of clear fluids slowly. Gradually add more. °· Once you are able to eat without vomiting, eat soft foods in small amounts every 3-4 hours. °General instructions ° °· Do not use any products that contain marijuana.If you need help quitting, ask your health care provider for resources and treatment options. °· Drink enough fluid to keep your urine pale yellow. Avoid drinking fluids that have a lot of sugar or caffeine, such as coffee and soda. °· Take and apply over-the-counter and prescription medicines only as told by your health care provider. Ask your health care provider before starting any new medicines or treatments. °· Keep all follow-up visits as told by your health care provider. This is important. °Contact a health care provider if: °· Your symptoms get worse. °· You cannot drink fluids without vomiting. °· You have pain and trouble swallowing after an attack. °Get help right away if: °· You cannot stop vomiting. °· You have blood in your vomit or your vomit looks like coffee grounds. °· You have   severe abdominal pain. °· You have stools that are bloody or black, or stools that look like tar. °· You have symptoms of dehydration, such as: °? Sunken eyes. °? Inability to make tears. °? Cracked lips. °? Dry mouth. °? Decreased urine production. °? Weakness. °? Sleepiness. °? Fainting. °Summary °· Cannabinoid hyperemesis  syndrome (CHS) is a condition that causes repeated nausea, vomiting, and abdominal pain after long-term use of marijuana. °· People with CHS typically use marijuana 3-5 times a day for many years before they have symptoms, although it is possible to develop CHS with as little as 1 use per day. °· Treatment for this condition involves stopping marijuana use. Hot showers and capsaicin creams may also help relieve symptoms. Ask your health care provider before starting any medicines or other treatments. °· Your health care provider may prescribe medicines to help with nausea. °· Get help right away if you have signs of dehydration, such as dry mouth, decreased urine production, or weakness. °This information is not intended to replace advice given to you by your health care provider. Make sure you discuss any questions you have with your health care provider. °Document Released: 10/17/2016 Document Revised: 11/15/2017 Document Reviewed: 10/17/2016 °Elsevier Patient Education © 2020 [employer] ° °

## 2019-01-18 ENCOUNTER — Encounter (HOSPITAL_COMMUNITY): Payer: Self-pay | Admitting: *Deleted

## 2019-01-18 ENCOUNTER — Inpatient Hospital Stay (HOSPITAL_COMMUNITY)
Admission: AD | Admit: 2019-01-18 | Discharge: 2019-01-18 | Disposition: A | Discharge status: Home / Self Care | Payer: Medicaid Other | Attending: Obstetrics and Gynecology | Admitting: Obstetrics and Gynecology

## 2019-01-18 DIAGNOSIS — O210 Mild hyperemesis gravidarum: Secondary | ICD-10-CM | POA: Diagnosis present

## 2019-01-18 DIAGNOSIS — O99321 Drug use complicating pregnancy, first trimester: Secondary | ICD-10-CM | POA: Diagnosis not present

## 2019-01-18 DIAGNOSIS — F12188 Cannabis abuse with other cannabis-induced disorder: Secondary | ICD-10-CM | POA: Diagnosis not present

## 2019-01-18 DIAGNOSIS — Z87891 Personal history of nicotine dependence: Secondary | ICD-10-CM | POA: Diagnosis not present

## 2019-01-18 DIAGNOSIS — Z3490 Encounter for supervision of normal pregnancy, unspecified, unspecified trimester: Secondary | ICD-10-CM

## 2019-01-18 DIAGNOSIS — O2341 Unspecified infection of urinary tract in pregnancy, first trimester: Secondary | ICD-10-CM | POA: Diagnosis not present

## 2019-01-18 DIAGNOSIS — Z3A11 11 weeks gestation of pregnancy: Secondary | ICD-10-CM | POA: Diagnosis not present

## 2019-01-18 DIAGNOSIS — Z3491 Encounter for supervision of normal pregnancy, unspecified, first trimester: Secondary | ICD-10-CM

## 2019-01-18 LAB — URINALYSIS, ROUTINE W REFLEX MICROSCOPIC
Bilirubin Urine: NEGATIVE
Glucose, UA: NEGATIVE mg/dL
Hgb urine dipstick: NEGATIVE
Ketones, ur: 5 mg/dL — AB
Nitrite: NEGATIVE
Protein, ur: >=300 mg/dL — AB
Specific Gravity, Urine: 1.027 (ref 1.005–1.030)
WBC, UA: >50 WBC/hpf — ABNORMAL HIGH (ref 0–5)
pH: 5 (ref 5.0–8.0)

## 2019-01-18 MED ORDER — FAMOTIDINE IN NACL 20-0.9 MG/50ML-% IV SOLN
20.0000 mg | Freq: Once | INTRAVENOUS | Status: AC
  Administered 2019-01-18: 20 mg via INTRAVENOUS
  Filled 2019-01-18: qty 50

## 2019-01-18 MED ORDER — ONDANSETRON HCL 4 MG/2ML IJ SOLN
4.0000 mg | Freq: Once | INTRAMUSCULAR | Status: AC
  Administered 2019-01-18: 14:00:00 4 mg via INTRAVENOUS
  Filled 2019-01-18: qty 2

## 2019-01-18 MED ORDER — DEXAMETHASONE SODIUM PHOSPHATE 10 MG/ML IJ SOLN
10.0000 mg | Freq: Once | INTRAMUSCULAR | Status: AC
  Administered 2019-01-18: 10 mg via INTRAVENOUS
  Filled 2019-01-18: qty 1

## 2019-01-18 MED ORDER — METOCLOPRAMIDE HCL 5 MG/ML IJ SOLN
10.0000 mg | Freq: Once | INTRAMUSCULAR | Status: AC
  Administered 2019-01-18: 13:00:00 10 mg via INTRAVENOUS
  Filled 2019-01-18: qty 2

## 2019-01-18 MED ORDER — LACTATED RINGERS IV BOLUS
1000.0000 mL | Freq: Once | INTRAVENOUS | Status: AC
  Administered 2019-01-18: 13:00:00 1000 mL via INTRAVENOUS

## 2019-01-18 MED ORDER — DIPHENHYDRAMINE HCL 50 MG/ML IJ SOLN
25.0000 mg | Freq: Once | INTRAMUSCULAR | Status: AC
  Administered 2019-01-18: 25 mg via INTRAVENOUS
  Filled 2019-01-18: qty 1

## 2019-01-18 MED ORDER — SCOPOLAMINE 1 MG/3DAYS TD PT72
1.0000 | MEDICATED_PATCH | TRANSDERMAL | Status: DC
  Administered 2019-01-18: 14:00:00 1.5 mg via TRANSDERMAL
  Filled 2019-01-18: qty 1

## 2019-01-18 MED ORDER — SODIUM CHLORIDE 0.9 % IV SOLN
8.0000 mg | Freq: Once | INTRAVENOUS | Status: DC
  Filled 2019-01-18: qty 4

## 2019-01-18 NOTE — Progress Notes (Signed)
Pt was sleeping when I went in to check on her. Pt states she feels relaxed and less nauseated.

## 2019-01-18 NOTE — MAU Provider Note (Addendum)
History     CSN: [id number]  Arrival date and time: 01/18/19 1200   First Provider Initiated Contact with Patient 01/18/19 1235      Chief Complaint  Patient presents with  . Nausea  . Emesis   HPI [name] is a 33 y.o. [id number] at [redacted]w[redacted]d who presents to MAU with chief complaint of severe nausea and vomiting. This is a recurrent problem for which patient has been seen in MAU multiple times, most recently yesterday morning.   Patient reports taking her Phenergan as prescribed but is unable to tolerate PO intake. She states she has tried saltine and [employer] as well as grapes but keeps vomiting.  She denies vaginal bleeding, abdominal pain, fever, falls, or recent illness. She endorses smoking THC 3-4 days ago.   OB History    Gravida  5   Para  3   Term  3   Preterm      AB  1   Living  3     SAB  1   TAB      Ectopic      Multiple      Live Births  3           Past Medical History:  Diagnosis Date  . History of reversal of tubal ligation 09/2018  . Medical history non-contributory     Past Surgical History:  Procedure Laterality Date  . TUBAL LIGATION  2008  . Tubal reversal  09/2018    Family History  Problem Relation Age of Onset  . Healthy Mother   . Healthy Father     Social History   Tobacco Use  . Smoking status: Former Smoker    Packs/day: 1.00    Types: Cigarettes  . Smokeless tobacco: Never Used  Substance Use Topics  . Alcohol use: No  . Drug use: Yes    Types: Marijuana    Allergies:  Allergies  Allergen Reactions  . Pollen Extract Hives    Medications Prior to Admission  Medication Sig Dispense Refill Last Dose  . cephALEXin (KEFLEX) 500 MG capsule Take 1 capsule (500 mg total) by mouth 2 (two) times daily. 14 capsule 0   . famotidine (PEPCID) 20 MG tablet Take 1 tablet (20 mg total) by mouth at bedtime. 30 tablet 2   . ondansetron (ZOFRAN ODT) 8 MG disintegrating tablet Take 1 tablet (8 mg total) by  mouth every 8 (eight) hours as needed for nausea or vomiting. 30 tablet 1   . potassium chloride 20 MEQ TBCR Take 40 mEq by mouth 2 (two) times daily. 20 tablet 0   . Prenatal Vit-Fe Fumarate-FA (PREPLUS) 27-1 MG TABS Take 1 tablet by mouth daily. 30 tablet 13     Review of Systems  Constitutional: Positive for fatigue. Negative for chills and fever.  Respiratory: Negative for shortness of breath.   Gastrointestinal: Positive for nausea and vomiting. Negative for abdominal pain.  Genitourinary: Negative for difficulty urinating, dysuria, vaginal bleeding and vaginal discharge.  Musculoskeletal: Negative for back pain.  Neurological: Negative for dizziness, syncope, weakness and headaches.  All other systems reviewed and are negative.  Physical Exam   Blood pressure 122/78, pulse 84, temperature 98 F (36.7 C), resp. rate 16, last menstrual period 11/02/2018, SpO2 100 %.  Physical Exam  Nursing note and vitals reviewed. Constitutional: She is oriented to person, place, and time. She appears well-developed and well-nourished.  Cardiovascular: Normal rate.  Respiratory: Effort normal. No respiratory distress.  GI: Soft. She exhibits no distension. There is no abdominal tenderness. There is no rebound and no guarding.  Musculoskeletal: Normal range of motion.  Neurological: She is alert and oriented to person, place, and time.  Skin: Skin is warm and dry.  Psychiatric: She has a normal mood and affect. Her behavior is normal. Judgment and thought content normal.    MAU Course/MDM  Procedures  --Assessment and plan of care compromised by patient intermittently ignoring questions posed by RN and CNM --Patient repeatedly drinking free water from the sink in her bathroom prior to and immediately following administration of anti-emetics --Patient intermittently turning her back to care team and "falling sleep" in the middle of assessment --Patient prescribed Keflex for UTI during MAU  visit yesterday morning. Encouraged to initiate medication --Patient emphatically declines discussion of cannabis hyperemesis and impact of THC on effectiveness of medication --Strongly encouraged patient to limit free water consumed on empty stomach. Reconsider food choices, advised starting with bland solid and eating slowly.  Patient Vitals for the past 24 hrs:  BP Temp Pulse Resp SpO2  01/18/19 1505 104/71 - 81 - -  01/18/19 1225 122/78 98 F (36.7 C) 84 16 100 %    Results for orders placed or performed during the hospital encounter of 01/18/19 (from the past 24 hour(s))  Urinalysis, Routine w reflex microscopic     Status: Abnormal   Collection Time: 01/18/19 12:19 PM  Result Value Ref Range   Color, Urine AMBER (A) YELLOW   APPearance CLOUDY (A) CLEAR   Specific Gravity, Urine 1.027 1.005 - 1.030   pH 5.0 5.0 - 8.0   Glucose, UA NEGATIVE NEGATIVE mg/dL   Hgb urine dipstick NEGATIVE NEGATIVE   Bilirubin Urine NEGATIVE NEGATIVE   Ketones, ur 5 (A) NEGATIVE mg/dL   Protein, ur >=409>=300 (A) NEGATIVE mg/dL   Nitrite NEGATIVE NEGATIVE   Leukocytes,Ua SMALL (A) NEGATIVE   RBC / HPF 11-20 0 - 5 RBC/hpf   WBC, UA >50 (H) 0 - 5 WBC/hpf   Bacteria, UA MANY (A) NONE SEEN   Squamous Epithelial / LPF 21-50 0 - 5   WBC Clumps PRESENT    Mucus PRESENT    Meds ordered this encounter  Medications  . metoCLOPramide (REGLAN) injection 10 mg  . diphenhydrAMINE (BENADRYL) injection 25 mg  . dexamethasone (DECADRON) injection 10 mg  . lactated ringers bolus 1,000 mL  . famotidine (PEPCID) IVPB 20 mg premix  . scopolamine (TRANSDERM-SCOP) 1 MG/3DAYS 1.5 mg  . ondansetron (ZOFRAN) injection 4 mg   Assessment and Plan  --33 y.o. [id number] at 4269w0d  --FHT 168 by Doppler --Cannabis Hyperemesis --UTI, abx previously prescribed  --Tolerating PO prior to discharge  Calvert CantorSamantha C Raequan Vanschaick, CNM 01/18/2019, 4:23 PM

## 2019-01-18 NOTE — Discharge Instructions (Signed)
What are the causes? The cause of this condition is not known. It may be related to changes in chemicals (hormones) in the body during pregnancy, such as the high level of pregnancy hormone (human chorionic gonadotropin) or the increase in the female sex hormone (estrogen). What are the signs or symptoms? Symptoms of this condition include:  Nausea that does not go away.  Vomiting that does not allow you to keep any food down.  Weight loss.  Body fluid loss (dehydration).  Having no desire to eat, or not liking food that you have previously enjoyed. How is this diagnosed? This condition may be diagnosed based on:  A physical exam.  Your medical history.  Your symptoms.  Blood tests.  Urine tests. How is this treated? This condition is managed by controlling symptoms. This may include:  Following an eating plan. This can help lessen nausea and vomiting.  Taking prescription medicines. An eating plan and medicines are often used together to help control symptoms. If medicines do not help relieve nausea and vomiting, you may need to receive fluids through an IV at the hospital. Follow these instructions at home: Eating and drinking   Avoid the following: ? Drinking fluids with meals. Try not to drink anything during the 30 minutes before and after your meals. ? Drinking more than 1 cup of fluid at a time. ? Eating foods that trigger your symptoms. These may include spicy foods, coffee, high-fat foods, very sweet foods, and acidic foods. ? Skipping meals. Nausea can be more intense on an empty stomach. If you cannot tolerate food, do not force it. Try sucking on ice chips or other frozen items and make up for missed calories later. ? Lying down within 2 hours after eating. ? Being exposed to environmental triggers. These may include food smells, smoky rooms, closed spaces, rooms with strong smells, warm or humid places, overly loud and noisy rooms, and rooms with motion or  flickering lights. Try eating meals in a well-ventilated area that is free of strong smells. ? Quick and sudden changes in your movement. ? Taking iron pills and multivitamins that contain iron. If you take prescription iron pills, do not stop taking them unless your health care provider approves. ? Preparing food. The smell of food can spoil your appetite or trigger nausea.  To help relieve your symptoms: ? Listen to your body. Everyone is different and has different preferences. Find what works best for you. ? Eat and drink slowly. ? Eat 5-6 small meals daily instead of 3 large meals. Eating small meals and snacks can help you avoid an empty stomach. ? In the morning, before getting out of bed, eat a couple of crackers to avoid moving around on an empty stomach. ? Try eating starchy foods as these are usually tolerated well. Examples include cereal, toast, bread, potatoes, pasta, rice, and pretzels. ? Include at least 1 serving of protein with your meals and snacks. Protein options include lean meats, poultry, seafood, beans, nuts, nut butters, eggs, cheese, and yogurt. ? Try eating a protein-rich snack before bed. Examples of a protein-rick snack include cheese and crackers or a peanut butter sandwich made with 1 slice of whole-wheat bread and 1 tsp (5 g) of peanut butter. ? Eat or suck on things that have ginger in them. It may help relieve nausea. Add  tsp ground ginger to hot tea or choose ginger tea. ? Try drinking 100% fruit juice or an electrolyte drink. An electrolyte drink contains sodium,  potassium, and chloride. °? Drink fluids that are cold, clear, and carbonated or sour. Examples include lemonade, ginger ale, lemon-lime soda, ice water, and sparkling water. °? Brush your teeth or use a mouth rinse after meals. °? Talk with your health care provider about starting a supplement of vitamin B6. °General instructions °· Take over-the-counter and prescription medicines only as told by your  health care provider. °· Follow instructions from your health care provider about eating or drinking restrictions. °· Continue to take your prenatal vitamins as told by your health care provider. If you are having trouble taking your prenatal vitamins, talk with your health care provider about different options. °· Keep all follow-up and pre-birth (prenatal) visits as told by your health care provider. This is important. °Contact a health care provider if: °· You have pain in your abdomen. °· You have a severe headache. °· You have vision problems. °· You are losing weight. °· You feel weak or dizzy. °Get help right away if: °· You cannot drink fluids without vomiting. °· You vomit blood. °· You have constant nausea and vomiting. °· You are very weak. °· You faint. °· You have a fever and your symptoms suddenly get worse. °Summary ° °· Making some changes to your eating habits may help relieve nausea and vomiting. °· This condition may be managed with medicine. °· If medicines do not help relieve nausea and vomiting, you may need to receive fluids through an IV at the hospital. °This information is not intended to replace advice given to you by your health care provider. Make sure you discuss any questions you have with your health care provider. °Document Released: 07/09/2005 Document Revised: 07/29/2017 Document Reviewed: 03/07/2016 °Elsevier Patient Education © 2020 [employer] Inc. ° °

## 2019-01-18 NOTE — MAU Note (Signed)
Pt states she cannot keep anything down. She thinks she has vomited about 10 times in the last 24 hours.  Denies diarrhea.  Last time she took medication was last night 12.5mg  phenergan. States she has not taken anything else since then because "it makes me sleepy and I cannot keep anything down".  States she is having mid to upper abdominal pain that is crampy from the vomiting that she rates 8/10 on the pain scale.  States she smoked cannabis three or four days ago. Denies VB.

## 2019-01-19 LAB — CULTURE, OB URINE: Culture: >=100000 — AB

## 2019-01-26 ENCOUNTER — Telehealth: Payer: Self-pay | Admitting: Family Medicine

## 2019-01-26 NOTE — Telephone Encounter (Signed)
Spoke with patient about her appointment on 7/7 @ 10:30. Patient was rescheduled because her ultrasound appointment was rescheduled to 7/9 @ 2:00. Patient was rescheduled for 7/9 @ 3:20 with [place]. Patient was instructed to wear a face mask and informed no visitors are allowed with her during the appointment. Patient was screened for covid symptoms and denied having any.

## 2019-01-27 ENCOUNTER — Ambulatory Visit (HOSPITAL_COMMUNITY): Admission: RE | Admit: 2019-01-27 | Payer: Medicaid Other | Source: Ambulatory Visit

## 2019-01-27 ENCOUNTER — Ambulatory Visit: Payer: Medicaid Other

## 2019-01-29 ENCOUNTER — Other Ambulatory Visit: Payer: Self-pay

## 2019-01-29 ENCOUNTER — Other Ambulatory Visit: Payer: Self-pay | Admitting: Certified Nurse Midwife

## 2019-01-29 ENCOUNTER — Ambulatory Visit (HOSPITAL_COMMUNITY)
Admission: RE | Admit: 2019-01-29 | Discharge: 2019-01-29 | Disposition: A | Discharge status: Home / Self Care | Payer: Medicaid Other | Source: Ambulatory Visit | Attending: Certified Nurse Midwife | Admitting: Certified Nurse Midwife

## 2019-01-29 ENCOUNTER — Encounter: Payer: Self-pay | Admitting: Family Medicine

## 2019-01-29 ENCOUNTER — Ambulatory Visit (INDEPENDENT_AMBULATORY_CARE_PROVIDER_SITE_OTHER): Payer: Medicaid Other

## 2019-01-29 DIAGNOSIS — Z9851 Tubal ligation status: Secondary | ICD-10-CM | POA: Diagnosis present

## 2019-01-29 DIAGNOSIS — Z712 Person consulting for explanation of examination or test findings: Secondary | ICD-10-CM

## 2019-01-29 NOTE — Progress Notes (Signed)
Pt here today for [place] results.  US shows viable pregnancy with EDD 08/06/19 and 11w 0d today.  Pt denies any pain or bleeding.  Pt given [place] pics.  [place] office to provide proof of pregnancy letter to start prenatal care.    Mel Almond, RN 01/29/19

## 2019-01-30 ENCOUNTER — Encounter: Payer: Self-pay | Admitting: Obstetrics & Gynecology

## 2019-01-30 ENCOUNTER — Ambulatory Visit (INDEPENDENT_AMBULATORY_CARE_PROVIDER_SITE_OTHER): Payer: Medicaid Other | Admitting: Obstetrics & Gynecology

## 2019-01-30 ENCOUNTER — Other Ambulatory Visit (HOSPITAL_COMMUNITY)
Admission: RE | Admit: 2019-01-30 | Discharge: 2019-01-30 | Disposition: A | Discharge status: Home / Self Care | Payer: Medicaid Other | Source: Ambulatory Visit | Attending: Obstetrics & Gynecology | Admitting: Obstetrics & Gynecology

## 2019-01-30 DIAGNOSIS — Z3491 Encounter for supervision of normal pregnancy, unspecified, first trimester: Secondary | ICD-10-CM

## 2019-01-30 DIAGNOSIS — Z3490 Encounter for supervision of normal pregnancy, unspecified, unspecified trimester: Secondary | ICD-10-CM | POA: Insufficient documentation

## 2019-01-30 DIAGNOSIS — Z3481 Encounter for supervision of other normal pregnancy, first trimester: Secondary | ICD-10-CM

## 2019-01-30 DIAGNOSIS — Z3A12 12 weeks gestation of pregnancy: Secondary | ICD-10-CM

## 2019-01-30 MED ORDER — BLOOD PRESSURE MONITOR KIT: 1.0000 | PACK | 0 refills | Status: DC

## 2019-01-30 NOTE — Progress Notes (Signed)
Pt presents for initial NOB visit.  Pt c/o N&V. No relief with Zofran nor Phenergan. Uses MJ for relief.

## 2019-01-30 NOTE — Progress Notes (Signed)
  Subjective:    [name] is being seen today for her first obstetrical visit.  This is a pregnancy. She had a tubal reversal 3/20.  She is at [redacted]w[redacted]d gestation. Her obstetrical history is significant for vomitting. Relationship with FOB: spouse, living together. Patient does intend to breast feed. Pregnancy history fully reviewed.  Patient reports vomiting.  Review of Systems:   Review of Systems  Homemaker  Objective:     BP 124/78   Pulse 95   Temp 98.6 F (37 C)   Wt 139 lb 4.8 oz (63.2 kg)   LMP 11/02/2018   BMI 24.68 kg/m  Physical Exam  Exam Breathing, conversing, and ambulating normally Well nourished, well hydrated Black female, no apparent distress Heart- rrr Lungs- CTAB Abd- benign   Assessment:    Pregnancy: [id number] Patient Active Problem List   Diagnosis Date Noted  . Supervision of normal pregnancy, antepartum 01/14/2019  . Dental abscess 03/31/2017       Plan:     Initial labs drawn. Prenatal vitamins. Problem list reviewed and updated. AFP3 discussed: declined. Role of ultrasound in pregnancy discussed; fetal survey: ordered. Amniocentesis discussed: not indicated. Follow up in 4 weeks. She uses marijuanna TID to help with the n/v. I have encouraged her to decrease and then stop the THC. Pap smear today with cotesting Baby Scripts and Mychart apps downloaded today NIPS/MFM u/s ordered Blood pressure cuff ordered  Lucely Leard C Damyon Mullane 01/30/2019

## 2019-02-01 LAB — URINE CULTURE, OB REFLEX

## 2019-02-01 LAB — CULTURE, OB URINE

## 2019-02-02 ENCOUNTER — Telehealth: Payer: Self-pay | Admitting: *Deleted

## 2019-02-02 LAB — OBSTETRIC PANEL, INCLUDING HIV
Antibody Screen: NEGATIVE
Basophils Absolute: 0 10*3/uL (ref 0.0–0.2)
Basos: 0 %
EOS (ABSOLUTE): 0.1 10*3/uL (ref 0.0–0.4)
Eos: 2 %
HIV Screen 4th Generation wRfx: NONREACTIVE
Hematocrit: 33.9 % — ABNORMAL LOW (ref 34.0–46.6)
Hemoglobin: 10.7 g/dL — ABNORMAL LOW (ref 11.1–15.9)
Hepatitis B Surface Ag: NEGATIVE
Immature Grans (Abs): 0 10*3/uL (ref 0.0–0.1)
Immature Granulocytes: 0 %
Lymphocytes Absolute: 1.3 10*3/uL (ref 0.7–3.1)
Lymphs: 14 %
MCH: 28.2 pg (ref 26.6–33.0)
MCHC: 31.6 g/dL (ref 31.5–35.7)
MCV: 89 fL (ref 79–97)
Monocytes Absolute: 0.8 10*3/uL (ref 0.1–0.9)
Monocytes: 8 %
Neutrophils Absolute: 7.2 10*3/uL — ABNORMAL HIGH (ref 1.4–7.0)
Neutrophils: 76 %
Platelets: 271 10*3/uL (ref 150–450)
RBC: 3.79 x10E6/uL (ref 3.77–5.28)
RDW: 13.3 % (ref 11.7–15.4)
RPR Ser Ql: NONREACTIVE
Rh Factor: POSITIVE
Rubella Antibodies, IGG: 3.38 index (ref >0.99)
WBC: 9.5 10*3/uL (ref 3.4–10.8)

## 2019-02-02 LAB — CERVICOVAGINAL ANCILLARY ONLY
Bacterial vaginitis: NEGATIVE
Candida vaginitis: NEGATIVE
Chlamydia: NEGATIVE
Neisseria Gonorrhea: NEGATIVE
Trichomonas: NEGATIVE

## 2019-02-02 NOTE — Telephone Encounter (Signed)
Pt called to office about registering for a kit. Asked for return call.  Attempt to return call.  No answer, LM on VM to call if needed.

## 2019-02-03 ENCOUNTER — Encounter: Payer: Self-pay | Admitting: Obstetrics & Gynecology

## 2019-02-03 ENCOUNTER — Telehealth: Payer: Self-pay | Admitting: *Deleted

## 2019-02-03 DIAGNOSIS — O99019 Anemia complicating pregnancy, unspecified trimester: Secondary | ICD-10-CM | POA: Insufficient documentation

## 2019-02-03 NOTE — Telephone Encounter (Signed)
Spoke with pt today regarding her [employer] acct.  Pt states she is having trouble getting signed up online. Pt was made aware she may contact [employer] at number on card for troubleshoot log in.  Pt also made aware she may call office at any time to verify if lab has resulted. Pt does have mychart and was made aware that she may also be able to view results once they are received.  Pt has no other concerns today.

## 2019-02-04 LAB — CYTOLOGY - PAP
Diagnosis: NEGATIVE
HPV 16/18/45 genotyping: NEGATIVE
HPV: DETECTED — AB

## 2019-02-07 NOTE — Progress Notes (Signed)
Chart reviewed for nurse visit. Agree with plan of care.   Wallace, Catherine Lauren, DO 02/07/2019 8:20 PM  

## 2019-02-10 ENCOUNTER — Other Ambulatory Visit: Payer: Self-pay | Admitting: Obstetrics and Gynecology

## 2019-02-10 ENCOUNTER — Telehealth: Payer: Self-pay

## 2019-02-10 DIAGNOSIS — Z3490 Encounter for supervision of normal pregnancy, unspecified, unspecified trimester: Secondary | ICD-10-CM

## 2019-02-10 NOTE — Telephone Encounter (Signed)
Contacted pt and advised of results and need for genetic referral. Referral placed for MFM genetic counseling by Dr. Elly Modena.

## 2019-02-11 ENCOUNTER — Encounter: Payer: Self-pay | Admitting: Obstetrics & Gynecology

## 2019-02-11 ENCOUNTER — Encounter: Payer: Self-pay | Admitting: Obstetrics and Gynecology

## 2019-02-11 DIAGNOSIS — R87810 Cervical high risk human papillomavirus (HPV) DNA test positive: Secondary | ICD-10-CM

## 2019-02-11 HISTORY — DX: Cervical high risk human papillomavirus (HPV) DNA test positive: R87.810

## 2019-02-12 ENCOUNTER — Encounter: Payer: Self-pay | Admitting: Obstetrics & Gynecology

## 2019-02-12 DIAGNOSIS — D563 Thalassemia minor: Secondary | ICD-10-CM | POA: Insufficient documentation

## 2019-02-12 HISTORY — DX: Thalassemia minor: D56.3

## 2019-02-26 ENCOUNTER — Encounter: Payer: Self-pay | Admitting: Certified Nurse Midwife

## 2019-02-26 ENCOUNTER — Ambulatory Visit (INDEPENDENT_AMBULATORY_CARE_PROVIDER_SITE_OTHER): Payer: Medicaid Other | Admitting: Certified Nurse Midwife

## 2019-02-26 ENCOUNTER — Other Ambulatory Visit: Payer: Self-pay

## 2019-02-26 DIAGNOSIS — Z3490 Encounter for supervision of normal pregnancy, unspecified, unspecified trimester: Secondary | ICD-10-CM

## 2019-02-26 DIAGNOSIS — Z3482 Encounter for supervision of other normal pregnancy, second trimester: Secondary | ICD-10-CM

## 2019-02-26 DIAGNOSIS — Z3A16 16 weeks gestation of pregnancy: Secondary | ICD-10-CM

## 2019-02-26 NOTE — Progress Notes (Signed)
CC: Gas   Pt has not received B/P Cuff.

## 2019-02-26 NOTE — Progress Notes (Signed)
[place] VIRTUAL VIDEO VISIT ENCOUNTER NOTE  Provider location: [facility] [place] [facility] [place]   I connected with [name] on 02/26/19 at 10:51 AM EDT by MyChart Video Encounter at home and verified that I am speaking with the correct person using two identifiers.   I discussed the limitations, risks, security and privacy concerns of performing an evaluation and management service virtually and the availability of in person appointments. I also discussed with the patient that there may be a patient responsible charge related to this service. The patient expressed understanding and agreed to proceed. Subjective:  [name] is a 33 y.o. [phone] at [redacted]w[redacted]d being seen today for ongoing prenatal care.  She is currently monitored for the following issues for this low-risk pregnancy and has Dental abscess; Supervision of normal pregnancy, antepartum; Anemia in pregnancy; Cervical high risk HPV (human papillomavirus) test positive; and Alpha thalassemia silent carrier on their problem list.  Patient reports no complaints.  Contractions: Not present. Vag. Bleeding: None.  Movement: Present. Denies any leaking of fluid.   The following portions of the patient's history were reviewed and updated as appropriate: allergies, current medications, past family history, past medical history, past social history, past surgical history and problem list.   Objective:  There were no vitals filed for this visit.  Fetal Status:     Movement: Present     General:  Alert, oriented and cooperative. Patient is in no acute distress.  Respiratory: Normal respiratory effort, no problems with respiration noted  Mental Status: Normal mood and affect. Normal behavior. Normal judgment and thought content.  Rest of physical exam deferred due to type of encounter  Imaging: US Ob Comp Less 14 Wks  Result Date: 01/29/2019 CLINICAL DATA:  History of tubal ligation with reversal. LMP  was 11/02/2018. By LMP patient is 12 weeks 4 days. EDC by LMP is 08/09/2019. EXAM: OBSTETRIC <14 WK ULTRASOUND TECHNIQUE: Transabdominal ultrasound was performed for evaluation of the gestation as well as the maternal uterus and adnexal regions. COMPARISON:  None applicable FINDINGS: Intrauterine gestational sac: Single Yolk sac:  Not Visualized. Embryo:  Visualized. Cardiac Activity: Visualized. Heart Rate: 150 bpm CRL:   67.0 mm   13 w 0 d                  [place] EDC: 08/06/2019 Subchorionic hemorrhage:  None Maternal uterus/adnexae: Normal appearance. Trace free pelvic fluid. IMPRESSION: Single living intrauterine fetus. Size and dates correlate well. Today's exam confirms clinical EDC of 08/09/2019. Electronically Signed   By: Nolon Nations M.D.   On: 01/29/2019 14:50    Assessment and Plan:  Pregnancy: [id number] at [redacted]w[redacted]d 1. Encounter for supervision of normal pregnancy, antepartum, unspecified gravidity - Patient doing well, no complaints reports she is stressed d/t genetic screening results  - Patient declines AFP- does not want any more labs or genetic screening, educated and discussed AFP and what that lab screens for  - Reviewed genetic screening with patient and answered questions. Educated on anatomy US and what to expect with anatomy US, discussed with patient MFM MD will discuss results with patient that day and discuss necessary follow ups, patient verbalizes understanding  - Anticipatory guidance on upcoming appointments  - COVID19 precautions  - Discussed with patient to call if she does not receive BP cuff in the next 7 days, patient verbalizes understanding.    Preterm labor symptoms and general obstetric precautions including but not limited to vaginal bleeding, contractions,  leaking of fluid and fetal movement were reviewed in detail with the patient. I discussed the assessment and treatment plan with the patient. The patient was provided an opportunity to ask questions and all were  answered. The patient agreed with the plan and demonstrated an understanding of the instructions. The patient was advised to call back or seek an in-person office evaluation/[facility] for any urgent or concerning symptoms. Please refer to After Visit Summary for other counseling recommendations.   I provided 10 minutes of face-to-face time during this encounter.  Return in about 5 weeks (around 04/02/2019) for ROB-mychart.  Future Appointments  Date Time Provider Department Center  03/17/2019 10:30 AM WH-MFC US 5 WH-MFCUS MFC-US    Sharyon CableVeronica C Evonne Rinks, CNM [facility], [facility]

## 2019-03-17 ENCOUNTER — Other Ambulatory Visit: Payer: Self-pay

## 2019-03-17 ENCOUNTER — Other Ambulatory Visit (HOSPITAL_COMMUNITY): Payer: Self-pay | Admitting: *Deleted

## 2019-03-17 ENCOUNTER — Other Ambulatory Visit: Payer: Self-pay | Admitting: Obstetrics & Gynecology

## 2019-03-17 ENCOUNTER — Ambulatory Visit (HOSPITAL_COMMUNITY)
Admission: RE | Admit: 2019-03-17 | Discharge: 2019-03-17 | Disposition: A | Discharge status: Home / Self Care | Payer: Medicaid Other | Source: Ambulatory Visit | Attending: Obstetrics and Gynecology | Admitting: Obstetrics and Gynecology

## 2019-03-17 DIAGNOSIS — Z3490 Encounter for supervision of normal pregnancy, unspecified, unspecified trimester: Secondary | ICD-10-CM

## 2019-03-17 DIAGNOSIS — Z3A19 19 weeks gestation of pregnancy: Secondary | ICD-10-CM | POA: Diagnosis not present

## 2019-03-17 DIAGNOSIS — D563 Thalassemia minor: Secondary | ICD-10-CM

## 2019-03-17 DIAGNOSIS — O285 Abnormal chromosomal and genetic finding on antenatal screening of mother: Secondary | ICD-10-CM | POA: Diagnosis not present

## 2019-03-18 ENCOUNTER — Other Ambulatory Visit: Payer: Self-pay | Admitting: Certified Nurse Midwife

## 2019-03-18 DIAGNOSIS — D563 Thalassemia minor: Secondary | ICD-10-CM

## 2019-03-31 ENCOUNTER — Ambulatory Visit (HOSPITAL_COMMUNITY): Payer: Medicaid Other

## 2019-04-02 ENCOUNTER — Telehealth (INDEPENDENT_AMBULATORY_CARE_PROVIDER_SITE_OTHER): Payer: Medicaid Other | Admitting: Obstetrics

## 2019-04-02 ENCOUNTER — Other Ambulatory Visit: Payer: Self-pay

## 2019-04-02 ENCOUNTER — Telehealth: Payer: Self-pay

## 2019-04-02 ENCOUNTER — Encounter: Payer: Self-pay | Admitting: Obstetrics

## 2019-04-02 DIAGNOSIS — Z3492 Encounter for supervision of normal pregnancy, unspecified, second trimester: Secondary | ICD-10-CM

## 2019-04-02 DIAGNOSIS — F12188 Cannabis abuse with other cannabis-induced disorder: Secondary | ICD-10-CM

## 2019-04-02 DIAGNOSIS — Z9889 Other specified postprocedural states: Secondary | ICD-10-CM

## 2019-04-02 DIAGNOSIS — Z3490 Encounter for supervision of normal pregnancy, unspecified, unspecified trimester: Secondary | ICD-10-CM

## 2019-04-02 DIAGNOSIS — Z3A21 21 weeks gestation of pregnancy: Secondary | ICD-10-CM

## 2019-04-02 DIAGNOSIS — D563 Thalassemia minor: Secondary | ICD-10-CM

## 2019-04-02 NOTE — Telephone Encounter (Signed)
I think you meant to send this to dr. Jodi Mourning

## 2019-04-02 NOTE — Progress Notes (Signed)
TELEHEALTH OBSTETRICS PRENATAL VIRTUAL VIDEO VISIT ENCOUNTER NOTE  Provider location: [facility] [place]   I connected with Adolph Pollack on 04/02/19 at 10:30 AM EDT by OB MyChart Video Encounter at home and verified that I am speaking with the correct person using two identifiers.   I discussed the limitations, risks, security and privacy concerns of performing an evaluation and management service virtually and the availability of in person appointments. I also discussed with the patient that there may be a patient responsible charge related to this service. The patient expressed understanding and agreed to proceed. Subjective:  [name] is a 33 y.o. [phone] at [redacted]w[redacted]d being seen today for ongoing prenatal care.  She is currently monitored for the following issues for this low-risk pregnancy and has Dental abscess; Supervision of normal pregnancy, antepartum; Anemia in pregnancy; Cervical high risk HPV (human papillomavirus) test positive; and Alpha thalassemia silent carrier on their problem list.  Patient reports no complaints.  Contractions: Not present. Vag. Bleeding: None.  Movement: Present. Denies any leaking of fluid.   The following portions of the patient's history were reviewed and updated as appropriate: allergies, current medications, past family history, past medical history, past social history, past surgical history and problem list.   Objective:  There were no vitals filed for this visit.  Fetal Status:     Movement: Present     General:  Alert, oriented and cooperative. Patient is in no acute distress.  Respiratory: Normal respiratory effort, no problems with respiration noted  Mental Status: Normal mood and affect. Normal behavior. Normal judgment and thought content.  Rest of physical exam deferred due to type of encounter  Imaging: [place] Mfm Ob Detail +14 Wk  Result Date: 03/18/2019  ----------------------------------------------------------------------  OBSTETRICS REPORT                        (Signed Final 03/18/2019 11:10 am) ---------------------------------------------------------------------- Patient Info  ID #:       [id number]                          D.O.B.:  11/07/1985 (32 yrs)  Name:       [name]                 Visit Date: 03/17/2019 10:56 am ---------------------------------------------------------------------- Performed By  Performed By:     Emeline Darling BS,      Secondary Phy.:    [facility]                    RDMS  Attending:        Noralee Space MD        Address:           [address]  [address]                                                              [place]                                                              [id number]  Referred By:      Allie BossierMYRA C DOVE MD         Location:          [facility]  Ref. Address:     [address]                    [place], [place]                    [id number] ---------------------------------------------------------------------- Orders   #  Description                          Code         Ordered By   1  US MFM OB DETAIL +14 WK              [id number]     [facility]  ----------------------------------------------------------------------   #  Order #                    Accession #                 Episode #   1  [id number]                  [id number]  ---------------------------------------------------------------------- Indications   [redacted] weeks gestation of pregnancy                Z3A.19   Encounter for antenatal screening for          Z36.3   malformations   Abnormal chromosomal and genetic finding       O28.5   on antenatal screening of mother   Silent Carrier Alpha Thalassemia   ---------------------------------------------------------------------- Vital Signs  Weight (lb): 139                               Height:        5'3"  BMI:         24.62 ---------------------------------------------------------------------- Fetal Evaluation  Num Of Fetuses:  1  Fetal Heart Rate(bpm):   153  Cardiac Activity:        Observed  Presentation:            Breech  Placenta:                Anterior  P. Cord Insertion:       Visualized  Amniotic Fluid  AFI FV:      Within normal limits                              Largest Pocket(cm)                              5.7 ---------------------------------------------------------------------- Biometry  BPD:      44.8  mm     G. Age:  19w 4d         62  %    CI:        72.09   %    70 - 86                                                          FL/HC:       20.2  %    16.1 - 18.3  HC:      167.9  mm     G. Age:  19w 3d         50  %    HC/AC:       1.13       1.09 - 1.39  AC:      148.1  mm     G. Age:  20w 1d         72  %    FL/BPD:      75.7  %  FL:       33.9  mm     G. Age:  20w 5d         86  %    FL/AC:       22.9  %    20 - 24  HUM:      31.5  mm     G. Age:  20w 4d         83  %  NFT:       3.5  mm  Est. FW:     340   gm   0 lb 12 oz      92  % ---------------------------------------------------------------------- OB History  Gravidity:    5         Term:   3        Prem:   0        SAB:   1  TOP:          0       Ectopic:  0        Living: 3 ---------------------------------------------------------------------- Gestational Age  LMP:           19w 2d        Date:  11/02/18                 EDD:   08/09/19  U/S Today:     20w 0d  EDD:   08/04/19  Best:          19w 2d     Det. By:  LMP  (11/02/18)          EDD:   08/09/19 ---------------------------------------------------------------------- Anatomy  Cranium:               Appears normal         Aortic Arch:            Appears normal  Cavum:                  Appears normal         Ductal Arch:            Not well visualized  Ventricles:            Appears normal         Diaphragm:              Appears normal  Choroid Plexus:        Appears normal         Stomach:                Appears normal, left                                                                        sided  Cerebellum:            Appears normal         Abdomen:                Appears normal  Posterior Fossa:       Appears normal         Abdominal Wall:         Appears nml (cord                                                                        insert, abd wall)  Nuchal Fold:           Appears normal         Cord Vessels:           Appears normal (3                                                                        vessel cord)  Face:                  Not well visualized    Kidneys:                Appear normal  Lips:                  Appears normal  Bladder:                Appears normal  Thoracic:              Appears normal         Spine:                  Appears normal  Heart:                 Not well visualized    Upper Extremities:      Appears normal  RVOT:                  Not well visualized    Lower Extremities:      Appears normal  LVOT:                  Not well visualized  Other:  Female gender. 5th digit visualized. Technically difficult due to fetal          position and movement. ---------------------------------------------------------------------- Cervix Uterus Adnexa  Cervix  Length:            3.9  cm.  Normal appearance by transabdominal scan. ---------------------------------------------------------------------- Impression  We performed fetal anatomy scan. No makers of  aneuploidies or fetal structural defects are seen. Fetal  biometry is consistent with her previously-established dates.  Amniotic fluid is normal and good fetal activity is seen.  Patient understands the limitations of ultrasound in detecting  fetal anomalies.  On cell-free fetal DNA screening, there  was insufficient fetal  DNA and the result showed increased risks for triploidy,  trisomies 38 and 13. She is also a silent carrier for alpha  thalassemia (aa/a-).  This study was read remotely and I did not counsel her on  cell-free fetal DNA screening results. ---------------------------------------------------------------------- Recommendations  -An appointment was made for her to return in 4 weeks for  completion of fetal anatomy.  -Recommend genetic counseling in 1 to 2 weeks (message  sent to Darrol Poke, CNM via [facility]). ----------------------------------------------------------------------                  Tama High, MD Electronically Signed Final Report   03/18/2019 11:10 am ----------------------------------------------------------------------   Assessment and Plan:  Pregnancy: [id number] at [redacted]w[redacted]d 1. Encounter for supervision of normal pregnancy, antepartum, unspecified gravidity  2. Alpha thalassemia silent carrier  3. 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 History of reversal of tubal ligation   Preterm labor symptoms and general obstetric precautions including but not limited to vaginal bleeding, contractions, leaking of fluid and fetal movement were reviewed in detail with the patient. I discussed the assessment and treatment plan with the patient. The patient was provided an opportunity to ask questions and all were answered. The patient agreed with the plan and demonstrated an understanding of the instructions. The patient was advised to call back or seek an in-person office evaluation/go to MAU at [facility] for any urgent or concerning symptoms. Please refer to After Visit Summary for other counseling recommendations.   I provided 10 minutes of face-to-face time during this encounter.  Return in about 4 weeks (around 04/30/2019) for MyChart.  Future Appointments  Date Time Provider [place]  04/14/2019  10:45 AM WH-MFC [place] 2 WH-MFCUS MFC-US    Charles Harper, [place] for [facility] [place] [facility] 04/02/2019

## 2019-04-02 NOTE — Progress Notes (Signed)
Virtual ROB   CC: None  

## 2019-04-02 NOTE — Telephone Encounter (Signed)
Patient called and would like to know if she can get something called in for her eczema. She reports that she has tried hydrocortisone and it is not helping.

## 2019-04-03 ENCOUNTER — Telehealth: Payer: Self-pay | Admitting: Obstetrics

## 2019-04-03 ENCOUNTER — Other Ambulatory Visit: Payer: Self-pay | Admitting: Obstetrics

## 2019-04-03 DIAGNOSIS — O285 Abnormal chromosomal and genetic finding on antenatal screening of mother: Secondary | ICD-10-CM

## 2019-04-03 DIAGNOSIS — L309 Dermatitis, unspecified: Secondary | ICD-10-CM

## 2019-04-03 MED ORDER — PIMECROLIMUS 1 % EX CREA: TOPICAL_CREAM | Freq: Two times a day (BID) | CUTANEOUS | 1 refills | Status: DC

## 2019-04-03 NOTE — Telephone Encounter (Signed)
Called patient to inform her that her cell-free DNA testing revealed increased risk for triploidies, and that genetic counseling is recommended.  Patient refused genetic counseling and any further testing.  All questions were answered to patient's satisfaction.

## 2019-04-03 NOTE — Telephone Encounter (Signed)
Called patient to inform her of positive testing for silent carrier for alpha thalassemia, and further genetic counseling is recommended.  Patient stated that she was aware of testing and would not like any further testing or genetic counseling.  All questions were answered to patient's satisfaction.

## 2019-04-14 ENCOUNTER — Other Ambulatory Visit: Payer: Self-pay

## 2019-04-14 ENCOUNTER — Ambulatory Visit (HOSPITAL_COMMUNITY)
Admission: RE | Admit: 2019-04-14 | Discharge: 2019-04-14 | Disposition: A | Discharge status: Home / Self Care | Payer: Medicaid Other | Source: Ambulatory Visit | Attending: Maternal & Fetal Medicine | Admitting: Maternal & Fetal Medicine

## 2019-04-14 ENCOUNTER — Other Ambulatory Visit (HOSPITAL_COMMUNITY): Payer: Self-pay | Admitting: *Deleted

## 2019-04-14 DIAGNOSIS — D563 Thalassemia minor: Secondary | ICD-10-CM | POA: Insufficient documentation

## 2019-04-14 DIAGNOSIS — Z3A23 23 weeks gestation of pregnancy: Secondary | ICD-10-CM | POA: Diagnosis not present

## 2019-04-14 DIAGNOSIS — O280 Abnormal hematological finding on antenatal screening of mother: Secondary | ICD-10-CM

## 2019-04-14 DIAGNOSIS — O285 Abnormal chromosomal and genetic finding on antenatal screening of mother: Secondary | ICD-10-CM | POA: Diagnosis not present

## 2019-04-14 DIAGNOSIS — Z362 Encounter for other antenatal screening follow-up: Secondary | ICD-10-CM | POA: Diagnosis not present

## 2019-04-30 ENCOUNTER — Telehealth (INDEPENDENT_AMBULATORY_CARE_PROVIDER_SITE_OTHER): Payer: Medicaid Other | Admitting: Obstetrics

## 2019-04-30 ENCOUNTER — Encounter: Payer: Self-pay | Admitting: Obstetrics

## 2019-04-30 DIAGNOSIS — D563 Thalassemia minor: Secondary | ICD-10-CM

## 2019-04-30 DIAGNOSIS — Z3A25 25 weeks gestation of pregnancy: Secondary | ICD-10-CM

## 2019-04-30 DIAGNOSIS — O99019 Anemia complicating pregnancy, unspecified trimester: Secondary | ICD-10-CM

## 2019-04-30 DIAGNOSIS — Z3490 Encounter for supervision of normal pregnancy, unspecified, unspecified trimester: Secondary | ICD-10-CM

## 2019-04-30 DIAGNOSIS — O99012 Anemia complicating pregnancy, second trimester: Secondary | ICD-10-CM

## 2019-04-30 DIAGNOSIS — F12188 Cannabis abuse with other cannabis-induced disorder: Secondary | ICD-10-CM

## 2019-04-30 MED ORDER — DOXYLAMINE-PYRIDOXINE 10-10 MG PO TBEC: DELAYED_RELEASE_TABLET | ORAL | 5 refills | Status: DC

## 2019-04-30 MED ORDER — FERROUS SULFATE 325 (65 FE) MG PO TABS: 325.0000 mg | ORAL_TABLET | Freq: Two times a day (BID) | ORAL | 5 refills | Status: DC

## 2019-04-30 NOTE — Progress Notes (Signed)
ROB  Virtual Visit   CC: None  

## 2019-04-30 NOTE — Progress Notes (Signed)
TELEHEALTH OBSTETRICS PRENATAL VIRTUAL VIDEO VISIT ENCOUNTER NOTE  Provider location: [facility] [employer] at [place]   I connected with Adolph Pollack on 04/30/19 at 10:30 AM EDT by OB MyChart Video Encounter at home and verified that I am speaking with the correct person using two identifiers.   I discussed the limitations, risks, security and privacy concerns of performing an evaluation and management service virtually and the availability of in person appointments. I also discussed with the patient that there may be a patient responsible charge related to this service. The patient expressed understanding and agreed to proceed. Subjective:  [name] is a 33 y.o. [id number] at [redacted]w[redacted]d being seen today for ongoing prenatal care.  She is currently monitored for the following issues for this low-risk pregnancy and has Dental abscess; Supervision of normal pregnancy, antepartum; Anemia in pregnancy; Cervical high risk HPV (human papillomavirus) test positive; and Alpha thalassemia silent carrier on their problem list.  Patient reports nausea and vomiting.  Contractions: Not present. Vag. Bleeding: None.  Movement: Present. Denies any leaking of fluid.   The following portions of the patient's history were reviewed and updated as appropriate: allergies, current medications, past family history, past medical history, past social history, past surgical history and problem list.   Objective:  There were no vitals filed for this visit.  Fetal Status:     Movement: Present     General:  Alert, oriented and cooperative. Patient is in no acute distress.  Respiratory: Normal respiratory effort, no problems with respiration noted  Mental Status: Normal mood and affect. Normal behavior. Normal judgment and thought content.  Rest of physical exam deferred due to type of encounter  Imaging: [place] Mfm Ob Follow Up  Result Date: 04/14/2019  ----------------------------------------------------------------------  OBSTETRICS REPORT                       (Signed Final 04/14/2019 01:29 pm) ---------------------------------------------------------------------- Patient Info  ID #:       [id number]                          D.O.B.:  06-01-1986 (32 yrs)  Name:       [name]                 Visit Date: 04/14/2019 11:11 am ---------------------------------------------------------------------- Performed By  Performed By:     Birdena Crandall        Secondary Phy.:   [facility]                    RDMS,RVT  Attending:        Lin Landsman      Address:          [address]                    [address]                                                             [address]  [address]                                                             [place]                                                             [address]  Referred By:      Allie Bossier MD         [facility]  Ref. Address:     [address]                    [place], [place]                    [address] ---------------------------------------------------------------------- Orders   #  Description                          Code         Ordered By   1  [place] MFM OB FOLLOW UP                  82956.21     Lin Landsman  ----------------------------------------------------------------------   #  Order #                    Accession #                 Episode #   1  [id number]                  [id number]                  [id number]  ---------------------------------------------------------------------- Indications   Antenatal follow-up for nonvisualized fetal    Z36.2   anatomy   Abnormal chromosomal and genetic finding       O28.5   on antenatal screening of mother   Silent Carrier  Alpha Thalassemia   [redacted] weeks gestation of pregnancy                Z3A.23  ---------------------------------------------------------------------- Vital Signs  Height:        5'3" ---------------------------------------------------------------------- Fetal Evaluation  Num Of Fetuses:         1  Fetal Heart Rate(bpm):  157  Cardiac Activity:       Observed  Presentation:           Variable  Placenta:               Anterior  P. Cord Insertion:      Visualized, central  Amniotic Fluid  AFI FV:      Within normal limits                              Largest Pocket(cm)                              5.7 ---------------------------------------------------------------------- Biometry  BPD:      56.8  mm     G. Age:  23w 3d         48  %    CI:        73.68   %    70 - 86                                                          FL/HC:      20.6   %    19.2 - 20.8  HC:      210.2  mm     G. Age:  23w 1d         27  %    HC/AC:      1.04        1.05 - 1.21  AC:      201.4  mm     G. Age:  24w 5d         84  %    FL/BPD:     76.4   %    71 - 87  FL:       43.4  mm     G. Age:  24w 2d         69  %    FL/AC:      21.5   %    20 - 24  Est. FW:     685  gm      1 lb 8 oz     88  % ---------------------------------------------------------------------- OB History  Gravidity:    5         Term:   3        Prem:   0        SAB:   1  TOP:          0       Ectopic:  0        Living: 3 ---------------------------------------------------------------------- Gestational Age  LMP:           23w 2d        Date:  11/02/18                 EDD:   08/09/19  U/S Today:     23w 6d  EDD:   08/05/19  Best:          23w 2d     Det. By:  LMP  (11/02/18)          EDD:   08/09/19 ---------------------------------------------------------------------- Anatomy  Cranium:               Appears normal         LVOT:                   Appears normal  Cavum:                 Appears  normal         Aortic Arch:            Appears normal  Ventricles:            Appears normal         Ductal Arch:            Appears normal  Choroid Plexus:        Appears normal         Diaphragm:              Previously seen  Cerebellum:            Appears normal         Stomach:                Appears normal, left                                                                        sided  Posterior Fossa:       Appears normal         Abdomen:                Appears normal  Nuchal Fold:           Not applicable (>20    Abdominal Wall:         Appears nml (cord                         wks GA)                                        insert, abd wall)  Face:                  Appears normal         Cord Vessels:           Appears normal (3                         (orbits and profile)                           vessel cord)  Lips:                  Appears normal         Kidneys:  Appear normal  Palate:                Appears normal         Bladder:                Appears normal  Thoracic:              Appears normal         Spine:                  Previously seen  Heart:                 Appears normal         Upper Extremities:      Previously seen                         (4CH, axis, and                         situs)  RVOT:                  Appears normal         Lower Extremities:      Previously seen  Other:  Female gender. 5th digit previously visualized. Technically difficult due          to fetal position and movement. ---------------------------------------------------------------------- Cervix Uterus Adnexa  Cervix  Length:           4.56  cm.  Normal appearance by transabdominal scan. ---------------------------------------------------------------------- Impression  Normal interval growth.  Insufficient fetal fraction-abnormal suggestive of triploidy,  trisomy 19 and 25. I reviewed today's findings explaining that  we did not observe markers of aneuploidy today. I discussed  with [name] the  option of repeating non-invasive testing  vs invasive testing. She declined both options and declined  meeting with our genetic counselor. She was aware of the  risk and benefits of all options. She shared that this  information made her nervous and that it wouldn't change her  management of the pregnancy. If something were abnormal  she would address it at the time of birth.  She did opt to have a repeat growth exam in the third  trimester. ---------------------------------------------------------------------- Recommendations  Follow up growth in 6-8 weeks. ----------------------------------------------------------------------               Lin Landsman, MD Electronically Signed Final Report   04/14/2019 01:29 pm ----------------------------------------------------------------------   Assessment and Plan:  Pregnancy: [id number] at [redacted]w[redacted]d  1. Encounter for supervision of normal pregnancy, antepartum, unspecified gravidity  2. Antepartum anemia Rx: - ferrous sulfate 325 (65 FE) MG tablet; Take 1 tablet (325 mg total) by mouth 2 (two) times daily with a meal.  Dispense: 60 tablet; Refill: 5  3. Alpha thalassemia silent carrier  4. 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 Max dose 4 tabs daily.  Dispense: 100 tablet; Refill: 5 - Boost nutritional supplement Rx through [facility]  Take 1 serving po bid with meals   Preterm labor symptoms and general obstetric precautions including but not limited to vaginal bleeding, contractions, leaking of fluid and fetal movement were reviewed in detail with the patient. I discussed the assessment and treatment plan with the patient. The patient was provided an opportunity to ask questions  and all were answered. The patient agreed with the plan and demonstrated an understanding of the instructions. The patient was advised to  call back or seek an in-person office evaluation/go to MAU at [facility] for any urgent or concerning symptoms. Please refer to After Visit Summary for other counseling recommendations.   I provided 10 minutes of face-to-face time during this encounter.  Return in about 3 weeks (around 05/21/2019) for ROB, 2 hour OGTT.  Future Appointments  Date Time Provider [place]  06/02/2019 10:45 AM [place] [facility]  06/02/2019 10:45 AM [place] 2 WH-[facility]    Baltazar Najjar, [place] [facility] 04/30/2019

## 2019-05-21 ENCOUNTER — Other Ambulatory Visit: Payer: Self-pay

## 2019-05-21 ENCOUNTER — Encounter: Payer: Self-pay | Admitting: Obstetrics

## 2019-05-21 ENCOUNTER — Other Ambulatory Visit: Payer: Medicaid Other

## 2019-05-21 ENCOUNTER — Ambulatory Visit (INDEPENDENT_AMBULATORY_CARE_PROVIDER_SITE_OTHER): Payer: Medicaid Other | Admitting: Obstetrics

## 2019-05-21 VITALS — BP 101/64 | HR 94 | Wt 136.5 lb

## 2019-05-21 DIAGNOSIS — Z3A28 28 weeks gestation of pregnancy: Secondary | ICD-10-CM

## 2019-05-21 DIAGNOSIS — Z3490 Encounter for supervision of normal pregnancy, unspecified, unspecified trimester: Secondary | ICD-10-CM

## 2019-05-21 DIAGNOSIS — O285 Abnormal chromosomal and genetic finding on antenatal screening of mother: Secondary | ICD-10-CM

## 2019-05-21 NOTE — Progress Notes (Signed)
Pt presents for ROB. Pt has no concerns today. 

## 2019-05-21 NOTE — Progress Notes (Signed)
Subjective:  [name] is a 33 y.o. [phone] at [redacted]w[redacted]d being seen today for ongoing prenatal care.  She is currently monitored for the following issues for this low-risk pregnancy and has Dental abscess; Supervision of normal pregnancy, antepartum; Anemia in pregnancy; Cervical high risk HPV (human papillomavirus) test positive; and Alpha thalassemia silent carrier on their problem list.  Patient reports no complaints.   .  .   . Denies leaking of fluid.   The following portions of the patient's history were reviewed and updated as appropriate: allergies, current medications, past family history, past medical history, past social history, past surgical history and problem list. Problem list updated.  Objective:  There were no vitals filed for this visit.  Fetal Status:           General:  Alert, oriented and cooperative. Patient is in no acute distress.  Skin: Skin is warm and dry. No rash noted.   Cardiovascular: Normal heart rate noted  Respiratory: Normal respiratory effort, no problems with respiration noted  Abdomen: Soft, gravid, appropriate for gestational age.       Pelvic:  Cervical exam deferred        Extremities: Normal range of motion.     Mental Status: Normal mood and affect. Normal behavior. Normal judgment and thought content.   Urinalysis:      Assessment and Plan:  Pregnancy: [id number] at [redacted]w[redacted]d  1. Encounter for supervision of normal pregnancy, antepartum, unspecified gravidity Rx: - Glucose Tolerance, 2 Hours w/1 Hour - CBC - HIV Antibody (routine testing w rflx) - RPR  2. Abnormal genetic test in pregnancy - declined genetic counseling and amniocentesis   Preterm labor symptoms and general obstetric precautions including but not limited to vaginal bleeding, contractions, leaking of fluid and fetal movement were reviewed in detail with the patient. Please refer to After Visit Summary for other counseling recommendations.  No follow-ups on  file.   Shelly Bombard, MD  05/21/2019

## 2019-05-22 LAB — CBC
Hematocrit: 31.6 % — ABNORMAL LOW (ref 34.0–46.6)
Hemoglobin: 10.4 g/dL — ABNORMAL LOW (ref 11.1–15.9)
MCH: 28.3 pg (ref 26.6–33.0)
MCHC: 32.9 g/dL (ref 31.5–35.7)
MCV: 86 fL (ref 79–97)
Platelets: 225 10*3/uL (ref 150–450)
RBC: 3.67 x10E6/uL — ABNORMAL LOW (ref 3.77–5.28)
RDW: 12.6 % (ref 11.7–15.4)
WBC: 9.9 10*3/uL (ref 3.4–10.8)

## 2019-05-22 LAB — HIV ANTIBODY (ROUTINE TESTING W REFLEX): HIV Screen 4th Generation wRfx: NONREACTIVE

## 2019-05-22 LAB — GLUCOSE TOLERANCE, 2 HOURS W/ 1HR
Glucose, 1 hour: 138 mg/dL (ref 65–179)
Glucose, 2 hour: 96 mg/dL (ref 65–152)
Glucose, Fasting: 80 mg/dL (ref 65–91)

## 2019-05-22 LAB — RPR: RPR Ser Ql: NONREACTIVE

## 2019-06-02 ENCOUNTER — Ambulatory Visit (HOSPITAL_COMMUNITY): Payer: Medicaid Other | Admitting: *Deleted

## 2019-06-02 ENCOUNTER — Encounter (HOSPITAL_COMMUNITY): Payer: Self-pay

## 2019-06-02 ENCOUNTER — Ambulatory Visit (HOSPITAL_COMMUNITY)
Admission: RE | Admit: 2019-06-02 | Discharge: 2019-06-02 | Disposition: A | Discharge status: Home / Self Care | Payer: Medicaid Other | Source: Ambulatory Visit | Attending: Maternal & Fetal Medicine | Admitting: Maternal & Fetal Medicine

## 2019-06-02 ENCOUNTER — Other Ambulatory Visit: Payer: Self-pay

## 2019-06-02 DIAGNOSIS — Z3A30 30 weeks gestation of pregnancy: Secondary | ICD-10-CM | POA: Diagnosis not present

## 2019-06-02 DIAGNOSIS — O280 Abnormal hematological finding on antenatal screening of mother: Secondary | ICD-10-CM

## 2019-06-02 DIAGNOSIS — D563 Thalassemia minor: Secondary | ICD-10-CM

## 2019-06-02 DIAGNOSIS — O99013 Anemia complicating pregnancy, third trimester: Secondary | ICD-10-CM | POA: Diagnosis present

## 2019-06-02 DIAGNOSIS — Z362 Encounter for other antenatal screening follow-up: Secondary | ICD-10-CM

## 2019-06-02 DIAGNOSIS — O285 Abnormal chromosomal and genetic finding on antenatal screening of mother: Secondary | ICD-10-CM

## 2019-06-04 ENCOUNTER — Encounter: Payer: Self-pay | Admitting: Obstetrics

## 2019-06-04 ENCOUNTER — Telehealth (INDEPENDENT_AMBULATORY_CARE_PROVIDER_SITE_OTHER): Payer: Medicaid Other | Admitting: Obstetrics

## 2019-06-04 DIAGNOSIS — Z3490 Encounter for supervision of normal pregnancy, unspecified, unspecified trimester: Secondary | ICD-10-CM

## 2019-06-04 DIAGNOSIS — D563 Thalassemia minor: Secondary | ICD-10-CM

## 2019-06-04 DIAGNOSIS — O285 Abnormal chromosomal and genetic finding on antenatal screening of mother: Secondary | ICD-10-CM

## 2019-06-04 DIAGNOSIS — O99013 Anemia complicating pregnancy, third trimester: Secondary | ICD-10-CM

## 2019-06-04 DIAGNOSIS — Z3A30 30 weeks gestation of pregnancy: Secondary | ICD-10-CM

## 2019-06-04 NOTE — Progress Notes (Signed)
Pt presents for [place] visit. Pt identified with 2 pt identifiers. She is currently [redacted]w[redacted]d. Pt is not able to check her bp at this time. She states that she checks her bp sometimes, but not very consistent and she has not been documenting her bp in babyrx. Pt has no other concerns.

## 2019-06-04 NOTE — Progress Notes (Signed)
TELEHEALTH OBSTETRICS PRENATAL VIRTUAL VIDEO VISIT ENCOUNTER NOTE  Provider location: [facility] at [place]   I connected with Adolph Pollack on 06/04/19 at  9:45 AM EST by OB MyChart Video Encounter at home and verified that I am speaking with the correct person using two identifiers.   I discussed the limitations, risks, security and privacy concerns of performing an evaluation and management service virtually and the availability of in person appointments. I also discussed with the patient that there may be a patient responsible charge related to this service. The patient expressed understanding and agreed to proceed. Subjective:  [name] is a 33 y.o. [phone] at [redacted]w[redacted]d being seen today for ongoing prenatal care.  She is currently monitored for the following issues for this low-risk pregnancy and has Dental abscess; Supervision of normal pregnancy, antepartum; Anemia in pregnancy; Cervical high risk HPV (human papillomavirus) test positive; and Alpha thalassemia silent carrier on their problem list.  Patient reports no complaints.  Contractions: Not present. Vag. Bleeding: None.  Movement: Present. Denies any leaking of fluid.   The following portions of the patient's history were reviewed and updated as appropriate: allergies, current medications, past family history, past medical history, past social history, past surgical history and problem list.   Objective:  There were no vitals filed for this visit.  Fetal Status:     Movement: Present     General:  Alert, oriented and cooperative. Patient is in no acute distress.  Respiratory: Normal respiratory effort, no problems with respiration noted  Mental Status: Normal mood and affect. Normal behavior. Normal judgment and thought content.  Rest of physical exam deferred due to type of encounter  Imaging: [place] Mfm Ob Follow Up  Result Date: 06/02/2019  ----------------------------------------------------------------------  OBSTETRICS REPORT                       (Signed Final 06/02/2019 12:10 pm) ---------------------------------------------------------------------- Patient Info  ID #:       [id number]                          D.O.B.:  26-Jul-1985 (32 yrs)  Name:       [name]                 Visit Date: 06/02/2019 10:50 am ---------------------------------------------------------------------- Performed By  Performed By:     Percell Boston          Secondary Phy.:   [facility]                    RDMS  Attending:        Ma Rings MD         Address:          [address]  [address]                                                             [place]                                                             [id number]  Referred By:      Allie BossierMYRA C DOVE MD         Location:         [facility]  Ref. Address:     [address]                    [place], [place]                    [id number] ---------------------------------------------------------------------- Orders   #  Description                          Code         Ordered By   1  US MFM OB FOLLOW UP                  [id number]     Lin LandsmanORENTHIAN                                                        BOOKER  ----------------------------------------------------------------------   #  Order #                    Accession #                 Episode #   1  [id number]                  [id number]                  [id number]  ---------------------------------------------------------------------- Indications   Abnormal chromosomal and genetic finding       O28.5   on antenatal screening of mother   Silent Carrier Alpha Thalassemia   Encounter for other antenatal screening        Z36.2   follow-up   [redacted]  weeks gestation of pregnancy                Z3A.30  ---------------------------------------------------------------------- Vital Signs  Height:        5'3" ---------------------------------------------------------------------- Fetal Evaluation  Num Of Fetuses:         1  Fetal Heart Rate(bpm):  131  Cardiac Activity:       Observed  Presentation:           Cephalic  Placenta:               Anterior  P. Cord Insertion:      Visualized, central  Amniotic Fluid  AFI FV:      Within normal limits  AFI Sum(cm)     %Tile       Largest Pocket(cm)  14.76           51          6.17  RUQ(cm)       RLQ(cm)       LUQ(cm)        LLQ(cm)  3.37          6.17          2.26           2.96 ---------------------------------------------------------------------- Biometry  BPD:      74.7  mm     G. Age:  30w 0d         28  %    CI:        74.55   %    70 - 86                                                          FL/HC:      21.9   %    19.2 - 21.4  HC:      274.6  mm     G. Age:  30w 0d         11  %    HC/AC:      1.05        0.99 - 1.21  AC:      262.6  mm     G. Age:  30w 3d         49  %    FL/BPD:     80.5   %    71 - 87  FL:       60.1  mm     G. Age:  31w 2d         64  %    FL/AC:      22.9   %    20 - 24  HUM:      54.4  mm     G. Age:  31w 4d         77  %  Est. FW:    1603  gm      3 lb 9 oz     48  % ---------------------------------------------------------------------- OB History  Gravidity:    5         Term:   3        Prem:   0        SAB:   1  TOP:          0       Ectopic:  0        Living: 3 ---------------------------------------------------------------------- Gestational Age  LMP:  30w 2d        Date:  11/02/18                 EDD:   08/09/19  U/S Today:     30w 3d                                        EDD:   08/08/19  Best:          30w 2d     Det. By:  LMP  (11/02/18)          EDD:   08/09/19  ---------------------------------------------------------------------- Anatomy  Cranium:               Appears normal         LVOT:                   Appears normal  Cavum:                 Previously seen        Aortic Arch:            Previously seen  Ventricles:            Appears normal         Ductal Arch:            Previously seen  Choroid Plexus:        Previously seen        Diaphragm:              Previously seen  Cerebellum:            Previously seen        Stomach:                Appears normal, left                                                                        sided  Posterior Fossa:       Previously seen        Abdomen:                Previously seen  Nuchal Fold:           Not applicable (>20    Abdominal Wall:         Previously seen                         wks GA)  Face:                  Orbits and profile     Cord Vessels:           Previously seen                         previously seen  Lips:                  Previously seen        Kidneys:  Appear normal  Palate:                Previously seen        Bladder:                Appears normal  Thoracic:              Appears normal         Spine:                  Previously seen  Heart:                 Appears normal         Upper Extremities:      Previously seen                         (4CH, axis, and                         situs)  RVOT:                  Appears normal         Lower Extremities:      Previously seen  Other:  Female gender. 5th digit previously visualized. Technically difficult due          to fetal position and movement. ---------------------------------------------------------------------- Cervix Uterus Adnexa  Cervix  Not visualized (advanced GA >24wks)  Uterus  No abnormality visualized.  Left Ovary  No adnexal mass visualized.  Right Ovary  No adnexal mass visualized.  Cul De Sac  No free fluid seen.  Adnexa  No abnormality visualized. ----------------------------------------------------------------------  Comments  This patient was seen for a follow up growth scan due to a  low fetal fraction that was noted on her cell free DNA test.  The patient denies any other problems in her current  pregnancy.  She was informed that the fetal growth and amniotic fluid  level appears appropriate for her gestational age.  Follow-up as indicated. ----------------------------------------------------------------------                   Ma Rings, MD Electronically Signed Final Report   06/02/2019 12:10 pm ----------------------------------------------------------------------   Assessment and Plan:  Pregnancy: [id number] at [redacted]w[redacted]d  1. Encounter for supervision of normal pregnancy, antepartum, unspecified gravidity  2. Abnormal genetic test in pregnancy  3. Alpha thalassemia silent carrier  4. Anemia during pregnancy in third trimester - taking iron / pnv's  Preterm labor symptoms and general obstetric precautions including but not limited to vaginal bleeding, contractions, leaking of fluid and fetal movement were reviewed in detail with the patient. I discussed the assessment and treatment plan with the patient. The patient was provided an opportunity to ask questions and all were answered. The patient agreed with the plan and demonstrated an understanding of the instructions. The patient was advised to call back or seek an in-person office evaluation/go to MAU at [facility] for any urgent or concerning symptoms. Please refer to After Visit Summary for other counseling recommendations.   I provided 10 minutes of face-to-face time during this encounter.  Return in about 2 weeks (around 06/18/2019) for MyChart.  Future Appointments  Date Time Provider Department Center  06/17/2019  3:55 PM [place], Brand Males, NP [facility] None    Coral Ceo, MD [facility] 06/04/2019

## 2019-06-09 ENCOUNTER — Telehealth: Payer: Self-pay

## 2019-06-09 NOTE — Telephone Encounter (Signed)
Return call to pt regarding HOT TUB/JACUZZIS /SAUNAS  Pt advised per Triage handout "Commonly asked questions during pregnancy" she should not get in any while pregnant due to increasing body temp.

## 2019-06-11 ENCOUNTER — Other Ambulatory Visit: Payer: Self-pay

## 2019-06-11 ENCOUNTER — Inpatient Hospital Stay (HOSPITAL_COMMUNITY)
Admission: AD | Admit: 2019-06-11 | Discharge: 2019-06-11 | Disposition: A | Discharge status: Home / Self Care | Payer: Medicaid Other | Source: Ambulatory Visit | Attending: Obstetrics & Gynecology | Admitting: Obstetrics & Gynecology

## 2019-06-11 ENCOUNTER — Encounter (HOSPITAL_COMMUNITY): Payer: Self-pay

## 2019-06-11 DIAGNOSIS — F17210 Nicotine dependence, cigarettes, uncomplicated: Secondary | ICD-10-CM | POA: Diagnosis not present

## 2019-06-11 DIAGNOSIS — O99333 Smoking (tobacco) complicating pregnancy, third trimester: Secondary | ICD-10-CM | POA: Diagnosis not present

## 2019-06-11 DIAGNOSIS — Z3A31 31 weeks gestation of pregnancy: Secondary | ICD-10-CM

## 2019-06-11 DIAGNOSIS — O212 Late vomiting of pregnancy: Secondary | ICD-10-CM | POA: Diagnosis not present

## 2019-06-11 DIAGNOSIS — E860 Dehydration: Secondary | ICD-10-CM | POA: Diagnosis not present

## 2019-06-11 DIAGNOSIS — O2343 Unspecified infection of urinary tract in pregnancy, third trimester: Secondary | ICD-10-CM | POA: Insufficient documentation

## 2019-06-11 DIAGNOSIS — O2340 Unspecified infection of urinary tract in pregnancy, unspecified trimester: Secondary | ICD-10-CM

## 2019-06-11 DIAGNOSIS — R112 Nausea with vomiting, unspecified: Secondary | ICD-10-CM

## 2019-06-11 DIAGNOSIS — O99283 Endocrine, nutritional and metabolic diseases complicating pregnancy, third trimester: Secondary | ICD-10-CM | POA: Insufficient documentation

## 2019-06-11 LAB — URINALYSIS, ROUTINE W REFLEX MICROSCOPIC
Bilirubin Urine: NEGATIVE
Glucose, UA: NEGATIVE mg/dL
Ketones, ur: 80 mg/dL — AB
Nitrite: POSITIVE — AB
Protein, ur: 100 mg/dL — AB
Specific Gravity, Urine: 1.018 (ref 1.005–1.030)
pH: 5 (ref 5.0–8.0)

## 2019-06-11 LAB — FETAL FIBRONECTIN: Fetal Fibronectin: NEGATIVE

## 2019-06-11 MED ORDER — CEPHALEXIN 500 MG PO CAPS: 500.0000 mg | ORAL_CAPSULE | Freq: Four times a day (QID) | ORAL | 2 refills | Status: DC

## 2019-06-11 MED ORDER — SODIUM CHLORIDE 0.9 % IV SOLN
Freq: Once | INTRAVENOUS | Status: AC
  Administered 2019-06-11: 04:00:00 via INTRAVENOUS

## 2019-06-11 MED ORDER — SODIUM CHLORIDE 0.9 % IV SOLN
1.0000 g | Freq: Once | INTRAVENOUS | Status: AC
  Administered 2019-06-11: 05:00:00 1 g via INTRAVENOUS
  Filled 2019-06-11: qty 1

## 2019-06-11 MED ORDER — LACTATED RINGERS IV SOLN: INTRAVENOUS | Status: DC

## 2019-06-11 NOTE — Discharge Instructions (Signed)
Dehydration, Adult  Dehydration is a condition in which there is not enough fluid or water in the body. This happens when you lose more fluids than you take in. Important organs, such as the kidneys, brain, and heart, cannot function without a proper amount of fluids. Any loss of fluids from the body can lead to dehydration. Dehydration can range from mild to severe. This condition should be treated right away to prevent it from becoming severe. What are the causes? This condition may be caused by:  Vomiting.  Diarrhea.  Excessive sweating, such as from heat exposure or exercise.  Not drinking enough fluid, especially: ? When ill. ? While doing activity that requires a lot of energy.  Excessive urination.  Fever.  Infection.  Certain medicines, such as medicines that cause the body to lose excess fluid (diuretics).  Inability to access safe drinking water.  Reduced physical ability to get adequate water and food. What increases the risk? This condition is more likely to develop in people:  Who have a poorly controlled long-term (chronic) illness, such as diabetes, heart disease, or kidney disease.  Who are age 34 or older.  Who are disabled.  Who live in a place with high altitude.  Who play endurance sports. What are the signs or symptoms? Symptoms of mild dehydration may include:  Thirst.  Dry lips.  Slightly dry mouth.  Dry, warm skin.  Dizziness. Symptoms of moderate dehydration may include:  Very dry mouth.  Muscle cramps.  Dark urine. Urine may be the color of tea.  Decreased urine production.  Decreased tear production.  Heartbeat that is irregular or faster than normal (palpitations).  Headache.  Light-headedness, especially when you stand up from a sitting position.  Fainting (syncope). Symptoms of severe dehydration may include:  Changes in skin, such as: ? Cold and clammy skin. ? Blotchy (mottled) or pale skin. ? Skin that does  not quickly return to normal after being lightly pinched and released (poor skin turgor).  Changes in body fluids, such as: ? Extreme thirst. ? No tear production. ? Inability to sweat when body temperature is high, such as in hot weather. ? Very little urine production.  Changes in vital signs, such as: ? Weak pulse. ? Pulse that is more than 100 beats a minute when sitting still. ? Rapid breathing. ? Low blood pressure.  Other changes, such as: ? Sunken eyes. ? Cold hands and feet. ? Confusion. ? Lack of energy (lethargy). ? Difficulty waking up from sleep. ? Short-term weight loss. ? Unconsciousness. How is this diagnosed? This condition is diagnosed based on your symptoms and a physical exam. Blood and urine tests may be done to help confirm the diagnosis. How is this treated? Treatment for this condition depends on the severity. Mild or moderate dehydration can often be treated at home. Treatment should be started right away. Do not wait until dehydration becomes severe. Severe dehydration is an emergency and it needs to be treated in a hospital. Treatment for mild dehydration may include:  Drinking more fluids.  Replacing salts and minerals in your blood (electrolytes) that you may have lost. Treatment for moderate dehydration may include:  Drinking an oral rehydration solution (ORS). This is a drink that helps you replace fluids and electrolytes (rehydrate). It can be found at pharmacies and retail stores. Treatment for severe dehydration may include:  Receiving fluids through an IV tube.  Receiving an electrolyte solution through a feeding tube that is passed through your nose and  into your stomach (nasogastric tube, or NG tube).  Correcting any abnormalities in electrolytes.  Treating the underlying cause of dehydration. Follow these instructions at home:  If directed by your health care provider, drink an ORS: ? Make an ORS by following instructions on the  package. ? Start by drinking small amounts, about  cup (120 mL) every 5-10 minutes. ? Slowly increase how much you drink until you have taken the amount recommended by your health care provider.  Drink enough clear fluid to keep your urine clear or pale yellow. If you were told to drink an ORS, finish the ORS first, then start slowly drinking other clear fluids. Drink fluids such as: ? Water. Do not drink only water. Doing that can lead to having too little salt (sodium) in the body (hyponatremia). ? Ice chips. ? Fruit juice that you have added water to (diluted fruit juice). ? Low-calorie sports drinks.  Avoid: ? Alcohol. ? Drinks that contain a lot of sugar. These include high-calorie sports drinks, fruit juice that is not diluted, and soda. ? Caffeine. ? Foods that are greasy or contain a lot of fat or sugar.  Take over-the-counter and prescription medicines only as told by your health care provider.  Do not take sodium tablets. This can lead to having too much sodium in the body (hypernatremia).  Eat foods that contain a healthy balance of electrolytes, such as bananas, oranges, potatoes, tomatoes, and spinach.  Keep all follow-up visits as told by your health care provider. This is important. Contact a health care provider if:  You have abdominal pain that: ? Gets worse. ? Stays in one area (localizes).  You have a rash.  You have a stiff neck.  You are more irritable than usual.  You are sleepier or more difficult to wake up than usual.  You feel weak or dizzy.  You feel very thirsty.  You have urinated only a small amount of very dark urine over 6-8 hours. Get help right away if:  You have symptoms of severe dehydration.  You cannot drink fluids without vomiting.  Your symptoms get worse with treatment.  You have a fever.  You have a severe headache.  You have vomiting or diarrhea that: ? Gets worse. ? Does not go away.  You have blood or green matter  (bile) in your vomit.  You have blood in your stool. This may cause stool to look black and tarry.  You have not urinated in 6-8 hours.  You faint.  Your heart rate while sitting still is over 100 beats a minute.  You have trouble breathing. This information is not intended to replace advice given to you by your health care provider. Make sure you discuss any questions you have with your health care provider. Document Released: 07/09/2005 Document Revised: 06/21/2017 Document Reviewed: 09/02/2015 Elsevier Patient Education  2020 [employer]   Nausea, Adult Nausea is feeling sick to your stomach or feeling that you are about to throw up (vomit). Feeling sick to your stomach is usually not serious, but it may be an early sign of a more serious medical problem. As you feel sicker to your stomach, you may throw up. If you throw up, or if you are not able to drink enough fluids, there is a risk that you may lose too much water in your body (get dehydrated). If you lose too much water in your body, you may:  Feel tired.  Feel thirsty.  Have a dry mouth.  Have cracked lips.  Go pee (urinate) less often. Older adults and people who have other diseases or a weak body defense system (immune system) have a higher risk of losing too much water in the body. The main goals of treating this condition are:  To relieve your nausea.  To ensure your nausea occurs less often.  To prevent throwing up and losing too much fluid. Follow these instructions at home: Watch your symptoms for any changes. Tell your doctor about them. Follow these instructions as told by your doctor. Eating and drinking      Take an ORS (oral rehydration solution). This is a drink that is sold at pharmacies and stores.  Drink clear fluids in small amounts as you are able. These include: ? Water. ? Ice chips. ? Fruit juice that has water added (diluted fruit juice). ? Low-calorie sports drinks.  Eat bland,  easy-to-digest foods in small amounts as you are able, such as: ? Bananas. ? Applesauce. ? Rice. ? Low-fat (lean) meats. ? Toast. ? Crackers.  Avoid drinking fluids that have a lot of sugar or caffeine in them. This includes energy drinks, sports drinks, and soda.  Avoid alcohol.  Avoid spicy or fatty foods. General instructions  Take over-the-counter and prescription medicines only as told by your doctor.  Rest at home while you get better.  Drink enough fluid to keep your pee (urine) pale yellow.  Take slow and deep breaths when you feel sick to your stomach.  Avoid food or things that have strong smells.  Wash your hands often with soap and water. If you cannot use soap and water, use hand sanitizer.  Make sure that all people in your home wash their hands well and often.  Keep all follow-up visits as told by your doctor. This is important. Contact a doctor if:  You feel sicker to your stomach.  You feel sick to your stomach for more than 2 days.  You throw up.  You are not able to drink fluids without throwing up.  You have new symptoms.  You have a fever.  You have a headache.  You have muscle cramps.  You have a rash.  You have pain while peeing.  You feel light-headed or dizzy. Get help right away if:  You have pain in your chest, neck, arm, or jaw.  You feel very weak or you pass out (faint).  You have throw up that is bright red or looks like coffee grounds.  You have bloody or black poop (stools) or poop that looks like tar.  You have a very bad headache, a stiff neck, or both.  You have very bad pain, cramping, or bloating in your belly (abdomen).  You have trouble breathing or you are breathing very quickly.  Your heart is beating very quickly.  Your skin feels cold and clammy.  You feel confused.  You have signs of losing too much water in your body, such as: ? Dark pee, very little pee, or no pee. ? Cracked lips. ? Dry  mouth. ? Sunken eyes. ? Sleepiness. ? Weakness. These symptoms may be an emergency. Do not wait to see if the symptoms will go away. Get medical help right away. Call your local emergency services (911 in the [place] Do not drive yourself to the hospital. Summary  Nausea is feeling sick to your stomach or feeling that you are about to throw up (vomit).  If you throw up, or if you are not able to  drink enough fluids, there is a risk that you may lose too much water in your body (get dehydrated).  Eat and drink what your doctor tells you. Take over-the-counter and prescription medicines only as told by your doctor.  Contact a doctor right away if your symptoms get worse or you have new symptoms.  Keep all follow-up visits as told by your doctor. This is important. This information is not intended to replace advice given to you by your health care provider. Make sure you discuss any questions you have with your health care provider. Document Released: 06/28/2011 Document Revised: 12/17/2017 Document Reviewed: 12/17/2017 Elsevier Patient Education  2020 [employer] Inc[employer]  Pregnancy and Urinary Tract Infection  A urinary tract infection (UTI) is an infection of any part of the urinary tract. This includes the kidneys, the tubes that connect your kidneys to your bladder (ureters), the bladder, and the tube that carries urine out of your body (urethra). These organs make, store, and get rid of urine in the body. Your health care provider may use other names to describe the infection. An upper UTI affects the ureters and kidneys (pyelonephritis). A lower UTI affects the bladder (cystitis) and urethra (urethritis). Most urinary tract infections are caused by bacteria in your genital area, around the entrance to your urinary tract (urethra). These bacteria grow and cause irritation and inflammation of your urinary tract. You are more likely to develop a UTI during pregnancy because the physical and hormonal  changes your body goes through can make it easier for bacteria to get into your urinary tract. Your growing baby also puts pressure on your bladder and can affect urine flow. It is important to recognize and treat UTIs in pregnancy because of the risk of serious complications for both you and your baby. How does this affect me? Symptoms of a UTI include:  Needing to urinate right away (urgently).  Frequent urination or passing small amounts of urine frequently.  Pain or burning with urination.  Blood in the urine.  Urine that smells bad or unusual.  Trouble urinating.  Cloudy urine.  Pain in the abdomen or lower back.  Vaginal discharge. You may also have:  Vomiting or a decreased appetite.  Confusion.  Irritability or tiredness.  A fever.  Diarrhea. How does this affect my baby? An untreated UTI during pregnancy could lead to a kidney infection or a systemic infection, which can cause health problems that could affect your baby. Possible complications of an untreated UTI include:  Giving birth to your baby before 37 weeks of pregnancy (premature).  Having a baby with a low birth weight.  Developing high blood pressure during pregnancy (preeclampsia).  Having a low hemoglobin level (anemia). What can I do to lower my risk? To prevent a UTI:  Go to the bathroom as soon as you feel the need. Do not hold urine for long periods of time.  Always wipe from front to back, especially after a bowel movement. Use each tissue one time when you wipe.  Empty your bladder after sex.  Keep your genital area dry.  Drink 6-10 glasses of water each day.  Do not douche or use deodorant sprays. How is this treated? Treatment for this condition may include:  Antibiotic medicines that are safe to take during pregnancy.  Other medicines to treat less common causes of UTI. Follow these instructions at home:  If you were prescribed an antibiotic medicine, take it as told by  your health care provider. Do not  stop using the antibiotic even if you start to feel better.  Keep all follow-up visits as told by your health care provider. This is important. Contact a health care provider if:  Your symptoms do not improve or they get worse.  You have abnormal vaginal discharge. Get help right away if you:  Have a fever.  Have nausea and vomiting.  Have back or side pain.  Feel contractions in your uterus.  Have lower belly pain.  Have a gush of fluid from your vagina.  Have blood in your urine. Summary  A urinary tract infection (UTI) is an infection of any part of the urinary tract, which includes the kidneys, ureters, bladder, and urethra.  Most urinary tract infections are caused by bacteria in your genital area, around the entrance to your urinary tract (urethra).  You are more likely to develop a UTI during pregnancy.  If you were prescribed an antibiotic medicine, take it as told by your health care provider. Do not stop using the antibiotic even if you start to feel better. This information is not intended to replace advice given to you by your health care provider. Make sure you discuss any questions you have with your health care provider. Document Released: 11/03/2010 Document Revised: 10/31/2018 Document Reviewed: 06/12/2018 Elsevier Patient Education  2020 [employer].

## 2019-06-11 NOTE — MAU Note (Signed)
Pt reports to MAU stating "I have not been able to keep anything down." She states she last ate something on 11/16 but has not tried to eat anything. She has tried to drink "blue fanta soda, [employer] Strawberry lemonade, some water." +FM, No vaginal bleeding or abnormal discharge.

## 2019-06-11 NOTE — MAU Provider Note (Signed)
Chief Complaint:  Dehydration   First Provider Initiated Contact with Patient 06/11/19 0251     HPI: [name] is a 33 y.o. [id number] at 36w4dho presents to maternity admissions reporting vomiting for two days.  Actually only vomited the first day.  This past day was able to drink two quarts of water.  Felt nauseated off and on.  Did not try to eat.  Has some lower abdominal tightening, not really painful.  States she really came in just "to see if the baby is alright". She reports good fetal movement, denies LOF, vaginal bleeding, vaginal itching/burning, urinary symptoms, h/a, dizziness, diarrhea, constipation or fever/chills.    Emesis  This is a new problem. The current episode started yesterday. The problem occurs intermittently. The problem has been rapidly improving. There has been no fever. Pertinent negatives include no abdominal pain, chills, diarrhea, dizziness, fever, headaches or myalgias. She has tried nothing for the symptoms.    RN note: Pt reports to MAU stating "I have not been able to keep anything down." She states she last ate something on 11/16 but has not tried to eat anything. She has tried to drink "blue fanta soda, [employer] Strawberry lemonade, some water." +FM, No vaginal bleeding or abnormal discharge  Past Medical History: Past Medical History:  Diagnosis Date  . History of reversal of tubal ligation 09/2018    Past obstetric history: OB History  Gravida Para Term Preterm AB Living  5 3 3   1 3   SAB TAB Ectopic Multiple Live Births  1       3    # Outcome Date GA Lbr Len/2nd Weight Sex Delivery Anes PTL Lv  5 Current           4 Term 2008     Vag-Spont   LIV  3 SAB 2005 134w0d       2 Term 2004     Vag-Spont   LIV  1 Term 2002     Vag-Spont   LIV    Past Surgical History: Past Surgical History:  Procedure Laterality Date  . TUBAL LIGATION  2008  . Tubal reversal  09/2018    Family History: Family History  Problem Relation Age of Onset  .  Healthy Mother   . Healthy Father     Social History: Social History   Tobacco Use  . Smoking status: Current Some Day Smoker    Packs/day: 0.25    Types: Cigarettes  . Smokeless tobacco: Never Used  . Tobacco comment: Pt states she hasn't smoked in a month  Substance Use Topics  . Alcohol use: No  . Drug use: Not Currently    Types: Marijuana    Comment: no use x 2wks     Allergies:  Allergies  Allergen Reactions  . Pollen Extract Hives    Meds:  Medications Prior to Admission  Medication Sig Dispense Refill Last Dose  . Prenatal Vit-Fe Fumarate-FA (PREPLUS) 27-1 MG TABS Take 1 tablet by mouth daily. 30 tablet 13 Past Week at Unknown time  . Blood Pressure Monitor KIT 1 kit by Does not apply route once a week. Check BP weekly.  Regular Cuff.  DX:  Z13.6         Z34.86 1 kit 0 More than a month at Unknown time  . Doxylamine-Pyridoxine (DICLEGIS) 10-10 MG TBEC 1 tab in AM, 1 tab mid afternoon 2 tabs at bedtime. Max dose 4 tabs daily. 100 tablet 5  at  Not Taking  . famotidine (PEPCID) 20 MG tablet Take 1 tablet (20 mg total) by mouth at bedtime. 30 tablet 2  at Not Taking  . ferrous sulfate 325 (65 FE) MG tablet Take 1 tablet (325 mg total) by mouth 2 (two) times daily with a meal. 60 tablet 5  at Not taking  . ondansetron (ZOFRAN ODT) 8 MG disintegrating tablet Take 1 tablet (8 mg total) by mouth every 8 (eight) hours as needed for nausea or vomiting. 30 tablet 1  at Not taking  . pimecrolimus (ELIDEL) 1 % cream Apply topically 2 (two) times daily. 60 g 1  at Not taking  . potassium chloride 20 MEQ TBCR Take 40 mEq by mouth 2 (two) times daily. 20 tablet 0  at Not Taking    I have reviewed patient's Past Medical Hx, Surgical Hx, Family Hx, Social Hx, medications and allergies.   ROS:  Review of Systems  Constitutional: Negative for chills and fever.  Gastrointestinal: Positive for vomiting. Negative for abdominal pain and diarrhea.  Musculoskeletal: Negative for  myalgias.  Neurological: Negative for dizziness and headaches.   Other systems negative  Physical Exam   Patient Vitals for the past 24 hrs:  BP Temp Pulse Resp SpO2 Weight  06/11/19 0234 116/64 98.3 F (36.8 C) 93 12 100 % -  06/11/19 0227 - - - - - 61.3 kg   Constitutional: Well-developed, well-nourished female in no acute distress.  Cardiovascular: normal rate and rhythm Respiratory: normal effort, clear to auscultation bilaterally GI: Abd soft, non-tender, gravid appropriate for gestational age.   No rebound or guarding. MS: Extremities nontender, no edema, normal ROM Neurologic: Alert and oriented x 4.  GU: Neg CVAT.  PELVIC EXAM:  Dilation: Closed Effacement (%): Thick Exam by:: Hansel Feinstein, CNM   FHT:  Baseline 140 , moderate variability, accelerations present, no decelerations Contractions: Uterine irritability   Labs: Results for orders placed or performed during the hospital encounter of 06/11/19 (from the past 24 hour(s))  Urinalysis, Routine w reflex microscopic     Status: Abnormal   Collection Time: 06/11/19  2:26 AM  Result Value Ref Range   Color, Urine AMBER (A) YELLOW   APPearance CLOUDY (A) CLEAR   Specific Gravity, Urine 1.018 1.005 - 1.030   pH 5.0 5.0 - 8.0   Glucose, UA NEGATIVE NEGATIVE mg/dL   Hgb urine dipstick MODERATE (A) NEGATIVE   Bilirubin Urine NEGATIVE NEGATIVE   Ketones, ur 80 (A) NEGATIVE mg/dL   Protein, ur 100 (A) NEGATIVE mg/dL   Nitrite POSITIVE (A) NEGATIVE   Leukocytes,Ua SMALL (A) NEGATIVE   RBC / HPF 0-5 0 - 5 RBC/hpf   WBC, UA 6-10 0 - 5 WBC/hpf   Bacteria, UA FEW (A) NONE SEEN   Squamous Epithelial / LPF 0-5 0 - 5   Mucus PRESENT    Hyaline Casts, UA PRESENT   Fetal fibronectin     Status: None   Collection Time: 06/11/19  3:05 AM  Result Value Ref Range   Fetal Fibronectin NEGATIVE NEGATIVE    O/Positive/-- (07/10 3154)  Imaging:    MAU Course/MDM: I have ordered labs and reviewed results.  Urine showed  signs of infection and mild dehydration NST reviewed, reassuring but initially tachycardic Discussed this is likely related to dehydration.  Offered IV hydration and she accepts.  Uterine irritability did diminish with fluids and fetal fibronectin returned as negative. Explained this is reassuring ALso discussed likely urinary tract infection.   Will give  loading dose of Rocephin since she is having contractions. Then will treat at home. Urine to culture  Treatments in MAU included IV hydration, IV Rocephin.    Assessment: Single IUP at 53w4dUterine irritability Urinary Tract infections  Plan: Discharge home Rx Keflex for UTI Urine to culture Preterm Labor precautions and fetal kick counts Follow up in Office for prenatal visits and recheck of status  Encouraged to return here or to other Urgent Care/ED if she develops worsening of symptoms, increase in pain, fever, or other concerning symptoms.   Pt stable at time of discharge.  MHansel FeinsteinCNM, MSN Certified Nurse-Midwife 06/11/2019 2:51 AM

## 2019-06-13 LAB — CULTURE, OB URINE: Culture: >=100000 — AB

## 2019-06-17 ENCOUNTER — Other Ambulatory Visit: Payer: Self-pay

## 2019-06-17 ENCOUNTER — Encounter: Payer: Self-pay | Admitting: Nurse Practitioner

## 2019-06-17 ENCOUNTER — Telehealth (INDEPENDENT_AMBULATORY_CARE_PROVIDER_SITE_OTHER): Payer: Medicaid Other | Admitting: Nurse Practitioner

## 2019-06-17 DIAGNOSIS — Z3A32 32 weeks gestation of pregnancy: Secondary | ICD-10-CM

## 2019-06-17 DIAGNOSIS — O2343 Unspecified infection of urinary tract in pregnancy, third trimester: Secondary | ICD-10-CM

## 2019-06-17 DIAGNOSIS — Z3480 Encounter for supervision of other normal pregnancy, unspecified trimester: Secondary | ICD-10-CM

## 2019-06-17 NOTE — Progress Notes (Signed)
I connected with patient @ on 06/17/19 at  3:55 PM EST by MyChart and verified that I am speaking with the correct person using two identifiers.  Patient is located at Home(?) and provider is located at [place] office.     The purpose of this virtual visit is to provide medical care while limiting exposure to the novel coronavirus. I discussed the limitations, risks, security and privacy concerns of performing an evaluation and management service by Earlie Server, NP and the availability of in person appointments. I also discussed with the patient that there may be a patient responsible charge related to this service. By engaging in this virtual visit, you consent to the provision of healthcare.  Additionally, you authorize for your insurance to be billed for the services provided during this visit.  The patient expressed understanding and agreed to proceed.  The following staff members participated in the virtual visit:  Izell Osgood, CNM and Earlie Server, NP    PRENATAL VISIT NOTE  Subjective:  [name] is a 33 y.o. [phone] at [redacted]w[redacted]d  for phone visit for ongoing prenatal care.  She is currently monitored for the following issues for this low-risk pregnancy and has Dental abscess; Supervision of normal pregnancy, antepartum; Anemia in pregnancy; Cervical high risk HPV (human papillomavirus) test positive; Alpha thalassemia silent carrier; and UTI (urinary tract infection) during pregnancy on their problem list.  Patient reports swelling in feet.  Contractions: Irritability. Vag. Bleeding: None.  Movement: Present. Denies leaking of fluid.   The following portions of the patient's history were reviewed and updated as appropriate: allergies, current medications, past family history, past medical history, past social history, past surgical history and problem list.   Objective:  There were no vitals filed for this visit. Self-Obtained - No BP cuff with her today.  No BPs in BabyScripts.   Fetal Status:     Movement: Present     Assessment and Plan:  Pregnancy: G5P3013 at [redacted]w[redacted]d 1. Supervision of other normal pregnancy, antepartum Doing well.  Does not have BP cuff with her today.  Reports baby moving.  No headaches.  Advised compression knee highs for edema in feet.  No other symptoms of gestational hypertension.  Likely dependent edema in late pregnancy.  Having Braxton Hicks contractions infrequently.  2.  UTI in pregnancy Reviewed last MAU visit.  Plans to pick up Keflex prescribed on 06-10-19.  Advised to take all of it and will do urine culture one week after completing medication.  No vomiting now. Drink at least 8 8-oz glasses of water every day.   Preterm labor symptoms and general obstetric precautions including but not limited to vaginal bleeding, contractions, leaking of fluid and fetal movement were reviewed in detail with the patient.  Return in about 2 weeks (around 07/01/2019) for [place].  Future Appointments  Date Time Provider [place]  06/17/2019  3:55 PM Burleson, Rona Ravens, NP [place] None     Time spent on virtual visit: 8 minutes  Virginia Rochester, NP

## 2019-06-24 ENCOUNTER — Telehealth: Payer: Self-pay

## 2019-06-24 NOTE — Telephone Encounter (Signed)
Pt contacted the triage line today with concerns of edema in lower limbs.  Spoke with Dr. Jodi Mourning "This is normal considering that she's 33+ weeks. I advise her to stop by [facility] to pick up some compression stockings and stay off of your feet as much as possible." Pt verbalized understanding.  She is to call the office if this doesn't work for her. -EH/RMA

## 2019-07-01 ENCOUNTER — Encounter: Payer: Self-pay | Admitting: Obstetrics

## 2019-07-01 ENCOUNTER — Other Ambulatory Visit: Payer: Self-pay

## 2019-07-01 ENCOUNTER — Telehealth (INDEPENDENT_AMBULATORY_CARE_PROVIDER_SITE_OTHER): Payer: Medicaid Other | Admitting: Obstetrics

## 2019-07-01 DIAGNOSIS — D563 Thalassemia minor: Secondary | ICD-10-CM | POA: Diagnosis not present

## 2019-07-01 DIAGNOSIS — O99013 Anemia complicating pregnancy, third trimester: Secondary | ICD-10-CM

## 2019-07-01 DIAGNOSIS — Z3A34 34 weeks gestation of pregnancy: Secondary | ICD-10-CM | POA: Diagnosis not present

## 2019-07-01 DIAGNOSIS — Z3480 Encounter for supervision of other normal pregnancy, unspecified trimester: Secondary | ICD-10-CM

## 2019-07-01 NOTE — Progress Notes (Signed)
Pt is unable to check BP during visit.

## 2019-07-01 NOTE — Progress Notes (Signed)
   TELEHEALTH VIRTUAL OBSTETRICS VISIT ENCOUNTER NOTE  I connected with [name] on 07/01/19 at  9:45 AM EST by telephone at home and verified that I am speaking with the correct person using two identifiers.   I discussed the limitations, risks, security and privacy concerns of performing an evaluation and management service by telephone and the availability of in person appointments. I also discussed with the patient that there may be a patient responsible charge related to this service. The patient expressed understanding and agreed to proceed.  Subjective:  [name] is a 33 y.o. [phone] at [redacted]w[redacted]d being followed for ongoing prenatal care.  She is currently monitored for the following issues for this low-risk pregnancy and has Dental abscess; Supervision of normal pregnancy, antepartum; Anemia in pregnancy; Cervical high risk HPV (human papillomavirus) test positive; Alpha thalassemia silent carrier; and UTI (urinary tract infection) during pregnancy on their problem list.  Patient reports no complaints. Reports fetal movement. Denies any contractions, bleeding or leaking of fluid.   The following portions of the patient's history were reviewed and updated as appropriate: allergies, current medications, past family history, past medical history, past social history, past surgical history and problem list.   Objective:   General:  Alert, oriented and cooperative.   Mental Status: Normal mood and affect perceived. Normal judgment and thought content.  Rest of physical exam deferred due to type of encounter  Assessment and Plan:  Pregnancy: G5P3013 at [redacted]w[redacted]d 1. Supervision of other normal pregnancy, antepartum  2. Alpha thalassemia silent carrier  3. Anemia during pregnancy in third trimester   Preterm labor symptoms and general obstetric precautions including but not limited to vaginal bleeding, contractions, leaking of fluid and fetal movement were reviewed in detail with  the patient.  I discussed the assessment and treatment plan with the patient. The patient was provided an opportunity to ask questions and all were answered. The patient agreed with the plan and demonstrated an understanding of the instructions. The patient was advised to call back or seek an in-person office evaluation/go to [facility]U at [facility] for any urgent or concerning symptoms. Please refer to After Visit Summary for other counseling recommendations.   I provided 10 minutes of non-face-to-face time during this encounter.  Return in about 2 weeks (around 07/15/2019) for ROB.   Baltazar Najjar, MD 07/01/2019

## 2019-07-15 ENCOUNTER — Other Ambulatory Visit: Payer: Self-pay

## 2019-07-15 ENCOUNTER — Other Ambulatory Visit (HOSPITAL_COMMUNITY)
Admission: RE | Admit: 2019-07-15 | Discharge: 2019-07-15 | Disposition: A | Discharge status: Home / Self Care | Payer: Medicaid Other | Source: Ambulatory Visit | Attending: Obstetrics | Admitting: Obstetrics

## 2019-07-15 ENCOUNTER — Ambulatory Visit (INDEPENDENT_AMBULATORY_CARE_PROVIDER_SITE_OTHER): Payer: Medicaid Other | Admitting: Obstetrics

## 2019-07-15 ENCOUNTER — Encounter: Payer: Self-pay | Admitting: Obstetrics

## 2019-07-15 VITALS — BP 110/74 | HR 93 | Wt 149.1 lb

## 2019-07-15 DIAGNOSIS — Z3480 Encounter for supervision of other normal pregnancy, unspecified trimester: Secondary | ICD-10-CM

## 2019-07-15 DIAGNOSIS — O99013 Anemia complicating pregnancy, third trimester: Secondary | ICD-10-CM

## 2019-07-15 DIAGNOSIS — D563 Thalassemia minor: Secondary | ICD-10-CM

## 2019-07-15 DIAGNOSIS — Z3A36 36 weeks gestation of pregnancy: Secondary | ICD-10-CM

## 2019-07-15 NOTE — Progress Notes (Signed)
Subjective:  [name] is a 33 y.o. [phone] at [redacted]w[redacted]d being seen today for ongoing prenatal care.  She is currently monitored for the following issues for this low-risk pregnancy and has Dental abscess; Supervision of normal pregnancy, antepartum; Anemia in pregnancy; Cervical high risk HPV (human papillomavirus) test positive; Alpha thalassemia silent carrier; and UTI (urinary tract infection) during pregnancy on their problem list.  Patient reports no complaints.  Contractions: Irregular. Vag. Bleeding: None.  Movement: Present. Denies leaking of fluid.   The following portions of the patient's history were reviewed and updated as appropriate: allergies, current medications, past family history, past medical history, past social history, past surgical history and problem list. Problem list updated.  Objective:   Vitals:   07/15/19 1024  BP: 110/74  Pulse: 93  Weight: 149 lb 1.6 oz (67.6 kg)    Fetal Status:     Movement: Present     General:  Alert, oriented and cooperative. Patient is in no acute distress.  Skin: Skin is warm and dry. No rash noted.   Cardiovascular: Normal heart rate noted  Respiratory: Normal respiratory effort, no problems with respiration noted  Abdomen: Soft, gravid, appropriate for gestational age. Pain/Pressure: Present     Pelvic:  Cervical exam deferred        Extremities: Normal range of motion.  Edema: Trace  Mental Status: Normal mood and affect. Normal behavior. Normal judgment and thought content.   Urinalysis:      Assessment and Plan:  Pregnancy: A5W0981 at [redacted]w[redacted]d  1. Supervision of other normal pregnancy, antepartum Rx: - Strep Gp B NAA - Cervicovaginal ancillary only( [facility])  2. Alpha thalassemia silent carrier  3. Anemia during pregnancy in third trimester   Preterm labor symptoms and general obstetric precautions including but not limited to vaginal bleeding, contractions, leaking of fluid and fetal movement were reviewed in  detail with the patient. Please refer to After Visit Summary for other counseling recommendations.   Return in about 1 week (around 07/22/2019) for MyChart.   Shelly Bombard, MD  07/15/2019

## 2019-07-15 NOTE — Progress Notes (Signed)
Pt presents for ROB and GBS. Pt has no other concerns.

## 2019-07-16 LAB — CERVICOVAGINAL ANCILLARY ONLY
Chlamydia: NEGATIVE
Comment: NEGATIVE
Comment: NORMAL
Neisseria Gonorrhea: NEGATIVE

## 2019-07-17 LAB — STREP GP B NAA: Strep Gp B NAA: NEGATIVE

## 2019-07-22 ENCOUNTER — Encounter: Payer: Self-pay | Admitting: Medical

## 2019-07-22 ENCOUNTER — Telehealth (INDEPENDENT_AMBULATORY_CARE_PROVIDER_SITE_OTHER): Payer: Medicaid Other | Admitting: Medical

## 2019-07-22 DIAGNOSIS — D563 Thalassemia minor: Secondary | ICD-10-CM | POA: Diagnosis not present

## 2019-07-22 DIAGNOSIS — Z3A37 37 weeks gestation of pregnancy: Secondary | ICD-10-CM | POA: Diagnosis not present

## 2019-07-22 DIAGNOSIS — Z3480 Encounter for supervision of other normal pregnancy, unspecified trimester: Secondary | ICD-10-CM

## 2019-07-22 DIAGNOSIS — O99013 Anemia complicating pregnancy, third trimester: Secondary | ICD-10-CM

## 2019-07-22 NOTE — Patient Instructions (Signed)
Fetal Movement Counts Patient Name: ________________________________________________ Patient Due Date: ____________________ What is a fetal movement count?  A fetal movement count is the number of times that you feel your baby move during a certain amount of time. This may also be called a fetal kick count. A fetal movement count is recommended for every pregnant woman. You may be asked to start counting fetal movements as early as week 28 of your pregnancy. Pay attention to when your baby is most active. You may notice your baby's sleep and wake cycles. You may also notice things that make your baby move more. You should do a fetal movement count:  When your baby is normally most active.  At the same time each day. A good time to count movements is while you are resting, after having something to eat and drink. How do I count fetal movements? 1. Find a quiet, comfortable area. Sit, or lie down on your side. 2. Write down the date, the start time and stop time, and the number of movements that you felt between those two times. Take this information with you to your health care visits. 3. For 2 hours, count kicks, flutters, swishes, rolls, and jabs. You should feel at least 10 movements during 2 hours. 4. You may stop counting after you have felt 10 movements. 5. If you do not feel 10 movements in 2 hours, have something to eat and drink. Then, keep resting and counting for 1 hour. If you feel at least 4 movements during that hour, you may stop counting. Contact a health care provider if:  You feel fewer than 4 movements in 2 hours.  Your baby is not moving like he or she usually does. Date: ____________ Start time: ____________ Stop time: ____________ Movements: ____________ Date: ____________ Start time: ____________ Stop time: ____________ Movements: ____________ Date: ____________ Start time: ____________ Stop time: ____________ Movements: ____________ Date: ____________ Start time:  ____________ Stop time: ____________ Movements: ____________ Date: ____________ Start time: ____________ Stop time: ____________ Movements: ____________ Date: ____________ Start time: ____________ Stop time: ____________ Movements: ____________ Date: ____________ Start time: ____________ Stop time: ____________ Movements: ____________ Date: ____________ Start time: ____________ Stop time: ____________ Movements: ____________ Date: ____________ Start time: ____________ Stop time: ____________ Movements: ____________ This information is not intended to replace advice given to you by your health care provider. Make sure you discuss any questions you have with your health care provider. Document Released: 08/08/2006 Document Revised: 07/29/2018 Document Reviewed: 08/18/2015 Elsevier Patient Education  2020 Else[employer] Inc[employer] Braxton Hicks Contractions Contractions of the uterus can occur throughout pregnancy, but they are not always a sign that you are in labor. You may have practice contractions called Braxton Hicks contractions. These false labor contractions are sometimes confused with true labor. What are Braxton Hicks contractions? Braxton Hicks contractions are tightening movements that occur in the muscles of the uterus before labor. Unlike true labor contractions, these contractions do not result in opening (dilation) and thinning of the cervix. Toward the end of pregnancy (32-34 weeks), Braxton Hicks contractions can happen more often and may become stronger. These contractions are sometimes difficult to tell apart from true labor because they can be very uncomfortable. You should not feel embarrassed if you go to the hospital with false labor. Sometimes, the only way to tell if you are in true labor is for your health care provider to look for changes in the cervix. The health care provider will do a physical exam and may monitor your contractions. If you   are not in true labor, the exam should show  that your cervix is not dilating and your water has not broken. If there are no other health problems associated with your pregnancy, it is completely safe for you to be sent home with false labor. You may continue to have Braxton Hicks contractions until you go into true labor. How to tell the difference between true labor and false labor True labor  Contractions last 30-70 seconds.  Contractions become very regular.  Discomfort is usually felt in the top of the uterus, and it spreads to the lower abdomen and low back.  Contractions do not go away with walking.  Contractions usually become more intense and increase in frequency.  The cervix dilates and gets thinner. False labor  Contractions are usually shorter and not as strong as true labor contractions.  Contractions are usually irregular.  Contractions are often felt in the front of the lower abdomen and in the groin.  Contractions may go away when you walk around or change positions while lying down.  Contractions get weaker and are shorter-lasting as time goes on.  The cervix usually does not dilate or become thin. Follow these instructions at home:   Take over-the-counter and prescription medicines only as told by your health care provider.  Keep up with your usual exercises and follow other instructions from your health care provider.  Eat and drink lightly if you think you are going into labor.  If Braxton Hicks contractions are making you uncomfortable: ? Change your position from lying down or resting to walking, or change from walking to resting. ? Sit and rest in a tub of warm water. ? Drink enough fluid to keep your urine pale yellow. Dehydration may cause these contractions. ? Do slow and deep breathing several times an hour.  Keep all follow-up prenatal visits as told by your health care provider. This is important. Contact a health care provider if:  You have a fever.  You have continuous pain in  your abdomen. Get help right away if:  Your contractions become stronger, more regular, and closer together.  You have fluid leaking or gushing from your vagina.  You pass blood-tinged mucus (bloody show).  You have bleeding from your vagina.  You have low back pain that you never had before.  You feel your baby's head pushing down and causing pelvic pressure.  Your baby is not moving inside you as much as it used to. Summary  Contractions that occur before labor are called Braxton Hicks contractions, false labor, or practice contractions.  Braxton Hicks contractions are usually shorter, weaker, farther apart, and less regular than true labor contractions. True labor contractions usually become progressively stronger and regular, and they become more frequent.  Manage discomfort from Braxton Hicks contractions by changing position, resting in a warm bath, drinking plenty of water, or practicing deep breathing. This information is not intended to replace advice given to you by your health care provider. Make sure you discuss any questions you have with your health care provider. Document Released: 11/22/2016 Document Revised: 06/21/2017 Document Reviewed: 11/22/2016 Elsevier Patient Education  2020 Elsevier Inc.  

## 2019-07-22 NOTE — Progress Notes (Signed)
I connected with  [name] on 07/22/19 by a video enabled telemedicine application and verified that I am speaking with the correct person using two identifiers.   I discussed the limitations of evaluation and management by telemedicine. The patient expressed understanding and agreed to proceed.  MyChart ROB.  C/o pressure. Patient stated that she is unable to check her BP at this time

## 2019-07-22 NOTE — Progress Notes (Signed)
I connected with [name] on 07/22/19 at 10:35 AM EST by: MyChart and verified that I am speaking with the correct person using two identifiers.  Patient is located at home and provider is located at [facility].     The purpose of this virtual visit is to provide medical care while limiting exposure to the novel coronavirus. I discussed the limitations, risks, security and privacy concerns of performing an evaluation and management service by MyChart and the availability of in person appointments. I also discussed with the patient that there may be a patient responsible charge related to this service. By engaging in this virtual visit, you consent to the provision of healthcare.  Additionally, you authorize for your insurance to be billed for the services provided during this visit.  The patient expressed understanding and agreed to proceed.  The following staff members participated in the virtual visit:  [name], RMA    PRENATAL VISIT NOTE  Subjective:  [name] is a 33 y.o. [id number] at [redacted]w[redacted]d  for phone visit for ongoing prenatal care.  She is currently monitored for the following issues for this low-risk pregnancy and has Dental abscess; Supervision of normal pregnancy, antepartum; Anemia in pregnancy; Cervical high risk HPV (human papillomavirus) test positive; Alpha thalassemia silent carrier; and UTI (urinary tract infection) during pregnancy on their problem list.  Patient reports no complaints.  Contractions: Irritability. Vag. Bleeding: None.  Movement: Present. Denies leaking of fluid.   The following portions of the patient's history were reviewed and updated as appropriate: allergies, current medications, past family history, past medical history, past social history, past surgical history and problem list.   Objective:  There were no vitals filed for this visit. Self-Obtained  Fetal Status:     Movement: Present     Assessment and Plan:  Pregnancy: G5P3013 at  [redacted]w[redacted]d 1. Alpha thalassemia silent carrier  2. Anemia during pregnancy in third trimester  3. Supervision of other normal pregnancy, antepartum - MOC still undecided. Discussed options for LARC PP - GBS negative at last visit. Patient advised.  - Patient advised that she will have weekly visits until delivery. Can be virtual if patient has BP cuff at home.   Term labor symptoms and general obstetric precautions including but not limited to vaginal bleeding, contractions, leaking of fluid and fetal movement were reviewed in detail with the patient.  Return in about 1 week (around 07/29/2019) for LOB, In-Person.  No future appointments.   Time spent on virtual visit: 15 minutes  Nichole Hough, PA-C

## 2019-07-24 NOTE — L&D Delivery Note (Addendum)
OB/GYN Faculty Practice Delivery Note  [name] is a 34 y.o. P7H2178 s/p SVD at [redacted]w[redacted]d. She was admitted for SOL.   ROM: 0h 82m with clear fluid GBS Status: Negative (12/23 1058) Maximum Maternal Temperature: 98.12F  Labor Progress: Patient presented to L&D for SOL. SVE in MAU 10/+3 I  Delivery Date/Time: 08/10/19 @ 1645 Delivery: Called to room and patient was complete and pushing. Head was initially OP, rotated spontaneously to ROT, and delivered with ease. No nuchal cord present. Shoulder and body delivered in usual fashion. Infant with spontaneous cry, placed on mother's abdomen, dried and stimulated. Cord clamped x 2 after 1-minute delay, and cut by FOB. Cord blood drawn. Placenta delivered spontaneously with gentle cord traction. Fundus firm with massage and Pitocin. Labia, perineum, vagina, and cervix inspected with superficial left labial tear, and 1st degree perineal tear. Bleeding resolved with direct pressure.  Baby Weight: pending  Placenta: Sent to L&D Complications: None Lacerations: superficial left labial tear, 1st degree perineal tear EBL: 300cc Analgesia: None   Infant: APGAR (1 MIN): 9   APGAR (5 MINS): 9      Lorri Frederick, DO, PGY-1 OBGYN Faculty Teaching Service  08/10/2019, 5:07 PM  Midwife attestation: I was gloved and present for delivery in its entirety and I agree with the above resident's note.  Donette Larry, CNM 5:57 PM

## 2019-07-29 ENCOUNTER — Ambulatory Visit (INDEPENDENT_AMBULATORY_CARE_PROVIDER_SITE_OTHER): Payer: Medicaid Other | Admitting: Obstetrics

## 2019-07-29 ENCOUNTER — Encounter: Payer: Self-pay | Admitting: Obstetrics

## 2019-07-29 ENCOUNTER — Other Ambulatory Visit: Payer: Self-pay

## 2019-07-29 VITALS — BP 105/62 | HR 84 | Wt 155.0 lb

## 2019-07-29 DIAGNOSIS — Z3480 Encounter for supervision of other normal pregnancy, unspecified trimester: Secondary | ICD-10-CM

## 2019-07-29 DIAGNOSIS — Z3483 Encounter for supervision of other normal pregnancy, third trimester: Secondary | ICD-10-CM

## 2019-07-29 DIAGNOSIS — Z3A38 38 weeks gestation of pregnancy: Secondary | ICD-10-CM

## 2019-07-29 DIAGNOSIS — D563 Thalassemia minor: Secondary | ICD-10-CM

## 2019-07-29 NOTE — Progress Notes (Signed)
Subjective:  [name] is a 34 y.o. [phone] at [redacted]w[redacted]d being seen today for ongoing prenatal care.  She is currently monitored for the following issues for this low-risk pregnancy and has Dental abscess; Supervision of normal pregnancy, antepartum; Anemia in pregnancy; Cervical high risk HPV (human papillomavirus) test positive; Alpha thalassemia silent carrier; and UTI (urinary tract infection) during pregnancy on their problem list.  Patient reports no complaints.  Contractions: Irregular. Vag. Bleeding: None.  Movement: Present. Denies leaking of fluid.   The following portions of the patient's history were reviewed and updated as appropriate: allergies, current medications, past family history, past medical history, past social history, past surgical history and problem list. Problem list updated.  Objective:   Vitals:   07/29/19 1515  BP: 105/62  Pulse: 84  Weight: 155 lb (70.3 kg)    Fetal Status:     Movement: Present     General:  Alert, oriented and cooperative. Patient is in no acute distress.  Skin: Skin is warm and dry. No rash noted.   Cardiovascular: Normal heart rate noted  Respiratory: Normal respiratory effort, no problems with respiration noted  Abdomen: Soft, gravid, appropriate for gestational age. Pain/Pressure: Present     Pelvic:  Cervical exam deferred        Extremities: Normal range of motion.     Mental Status: Normal mood and affect. Normal behavior. Normal judgment and thought content.   Urinalysis:      Assessment and Plan:  Pregnancy: D5H2992 at [redacted]w[redacted]d  1. Supervision of other normal pregnancy, antepartum   2. Alpha thalassemia silent carrier   Term labor symptoms and general obstetric precautions including but not limited to vaginal bleeding, contractions, leaking of fluid and fetal movement were reviewed in detail with the patient. Please refer to After Visit Summary for other counseling recommendations.  Return in about 1 week (around  08/05/2019) for MyChart.   Brock Bad, MD  07/29/2019

## 2019-08-01 ENCOUNTER — Other Ambulatory Visit: Payer: Self-pay

## 2019-08-01 ENCOUNTER — Encounter (HOSPITAL_COMMUNITY): Payer: Self-pay | Admitting: Obstetrics and Gynecology

## 2019-08-01 ENCOUNTER — Inpatient Hospital Stay (HOSPITAL_COMMUNITY)
Admission: AD | Admit: 2019-08-01 | Discharge: 2019-08-01 | Disposition: A | Discharge status: Home / Self Care | Payer: Medicaid Other | Attending: Obstetrics and Gynecology | Admitting: Obstetrics and Gynecology

## 2019-08-01 DIAGNOSIS — O4703 False labor before 37 completed weeks of gestation, third trimester: Secondary | ICD-10-CM

## 2019-08-01 DIAGNOSIS — Z3A38 38 weeks gestation of pregnancy: Secondary | ICD-10-CM | POA: Diagnosis not present

## 2019-08-01 DIAGNOSIS — O471 False labor at or after 37 completed weeks of gestation: Secondary | ICD-10-CM | POA: Insufficient documentation

## 2019-08-01 DIAGNOSIS — O479 False labor, unspecified: Secondary | ICD-10-CM

## 2019-08-01 NOTE — MAU Provider Note (Signed)
S: Patient is here for RN labor evaluation. Strip, vital signs, & chart Reviewed  I was asked to check her cervix.  Patient reports contractions & pelvic pressure. Denies LOF or vaginal bleeding. Good fetal movement.   O:  Vitals:   08/01/19 1435  BP: 125/76  Pulse: 83  Resp: 16  Temp: 98.2 F (36.8 C)  TempSrc: Oral   Physical Examination: General appearance - alert, well appearing, and in no distress Mental status - alert, oriented to person, place, and time Abdomen - soft, nontender Skin - warm & dry GU- Dilation: 1 Effacement (%): Thick Cervical Position: Posterior Station: Ballotable Exam by:: Judeth Horn NP   FHR: 135 bpm, Mod Var, no Decels, 15x15 Accels UC: none   A: 1. False labor   2. [redacted] weeks gestation of pregnancy      P:  Discharge home in stable condition - pt does not want to stay for recheck of cervix Labor eval & fetal movement form given F/u in office as scheduled or return to MAU as needed  Judeth Horn FNP 3:18 PM

## 2019-08-01 NOTE — Discharge Instructions (Signed)
Fetal Movement Counts Patient Name: ________________________________________________ Patient Due Date: ____________________ What is a fetal movement count?  A fetal movement count is the number of times that you feel your baby move during a certain amount of time. This may also be called a fetal kick count. A fetal movement count is recommended for every pregnant woman. You may be asked to start counting fetal movements as early as week 28 of your pregnancy. Pay attention to when your baby is most active. You may notice your baby's sleep and wake cycles. You may also notice things that make your baby move more. You should do a fetal movement count:  When your baby is normally most active.  At the same time each day. A good time to count movements is while you are resting, after having something to eat and drink. How do I count fetal movements? 1. Find a quiet, comfortable area. Sit, or lie down on your side. 2. Write down the date, the start time and stop time, and the number of movements that you felt between those two times. Take this information with you to your health care visits. 3. Write down your start time when you feel the first movement. 4. Count kicks, flutters, swishes, rolls, and jabs. You should feel at least 10 movements. 5. You may stop counting after you have felt 10 movements, or if you have been counting for 2 hours. Write down the stop time. 6. If you do not feel 10 movements in 2 hours, contact your health care provider for further instructions. Your health care provider may want to do additional tests to assess your baby's well-being. Contact a health care provider if:  You feel fewer than 10 movements in 2 hours.  Your baby is not moving like he or she usually does. Date: ____________ Start time: ____________ Stop time: ____________ Movements: ____________ Date: ____________ Start time: ____________ Stop time: ____________ Movements: ____________ Date: ____________  Start time: ____________ Stop time: ____________ Movements: ____________ Date: ____________ Start time: ____________ Stop time: ____________ Movements: ____________ Date: ____________ Start time: ____________ Stop time: ____________ Movements: ____________ Date: ____________ Start time: ____________ Stop time: ____________ Movements: ____________ Date: ____________ Start time: ____________ Stop time: ____________ Movements: ____________ Date: ____________ Start time: ____________ Stop time: ____________ Movements: ____________ Date: ____________ Start time: ____________ Stop time: ____________ Movements: ____________ This information is not intended to replace advice given to you by your health care provider. Make sure you discuss any questions you have with your health care provider. Document Revised: 02/26/2019 Document Reviewed: 02/26/2019 Elsevier Patient Education  2020 [employer]        Signs and Symptoms of Labor Labor is your body's natural process of moving your baby, placenta, and umbilical cord out of your uterus. The process of labor usually starts when your baby is full-term, between 37 and 40 weeks of pregnancy. How will I know when I am close to going into labor? As your body prepares for labor and the birth of your baby, you may notice the following symptoms in the weeks and days before true labor starts:  Having a strong desire to get your home ready to receive your new baby. This is called nesting. Nesting may be a sign that labor is approaching, and it may occur several weeks before birth. Nesting may involve cleaning and organizing your home.  Passing a small amount of thick, bloody mucus out of your vagina (normal bloody show or losing your mucus plug). This may happen more than a   week before labor begins, or it might occur right before labor begins as the opening of the cervix starts to widen (dilate). For some women, the entire mucus plug passes at once. For others,  smaller portions of the mucus plug may gradually pass over several days.  Your baby moving (dropping) lower in your pelvis to get into position for birth (lightening). When this happens, you may feel more pressure on your bladder and pelvic bone and less pressure on your ribs. This may make it easier to breathe. It may also cause you to need to urinate more often and have problems with bowel movements.  Having "practice contractions" (Braxton Hicks contractions) that occur at irregular (unevenly spaced) intervals that are more than 10 minutes apart. This is also called false labor. False labor contractions are common after exercise or sexual activity, and they will stop if you change position, rest, or drink fluids. These contractions are usually mild and do not get stronger over time. They may feel like: ? A backache or back pain. ? Mild cramps, similar to menstrual cramps. ? Tightening or pressure in your abdomen. Other early symptoms that labor may be starting soon include:  Nausea or loss of appetite.  Diarrhea.  Having a sudden burst of energy, or feeling very tired.  Mood changes.  Having trouble sleeping. How will I know when labor has begun? Signs that true labor has begun may include:  Having contractions that come at regular (evenly spaced) intervals and increase in intensity. This may feel like more intense tightening or pressure in your abdomen that moves to your back. ? Contractions may also feel like rhythmic pain in your upper thighs or back that comes and goes at regular intervals. ? For first-time mothers, this change in intensity of contractions often occurs at a more gradual pace. ? Women who have given birth before may notice a more rapid progression of contraction changes.  Having a feeling of pressure in the vaginal area.  Your water breaking (rupture of membranes). This is when the sac of fluid that surrounds your baby breaks. When this happens, you will notice  fluid leaking from your vagina. This may be clear or blood-tinged. Labor usually starts within 24 hours of your water breaking, but it may take longer to begin. ? Some women notice this as a gush of fluid. ? Others notice that their underwear repeatedly becomes damp. Follow these instructions at home:   When labor starts, or if your water breaks, call your health care provider or nurse care line. Based on your situation, they will determine when you should go in for an exam.  When you are in early labor, you may be able to rest and manage symptoms at home. Some strategies to try at home include: ? Breathing and relaxation techniques. ? Taking a warm bath or shower. ? Listening to music. ? Using a heating pad on the lower back for pain. If you are directed to use heat:  Place a towel between your skin and the heat source.  Leave the heat on for 20-30 minutes.  Remove the heat if your skin turns bright red. This is especially important if you are unable to feel pain, heat, or cold. You may have a greater risk of getting burned. Get help right away if:  You have painful, regular contractions that are 5 minutes apart or less.  Labor starts before you are [redacted] weeks along in your pregnancy.  You have a fever.  You have   a headache that does not go away.  You have bright red blood coming from your vagina.  You do not feel your baby moving.  You have a sudden onset of: ? Severe headache with vision problems. ? Nausea, vomiting, or diarrhea. ? Chest pain or shortness of breath. These symptoms may be an emergency. If your health care provider recommends that you go to the hospital or birth center where you plan to deliver, do not drive yourself. Have someone else drive you, or call emergency services (911 in the [place] Summary  Labor is your body's natural process of moving your baby, placenta, and umbilical cord out of your uterus.  The process of labor usually starts when your baby is  full-term, between 37 and 40 weeks of pregnancy.  When labor starts, or if your water breaks, call your health care provider or nurse care line. Based on your situation, they will determine when you should go in for an exam. This information is not intended to replace advice given to you by your health care provider. Make sure you discuss any questions you have with your health care provider. Document Revised: 04/08/2017 Document Reviewed: 12/14/2016 Elsevier Patient Education  2020 [employer] Inc.  

## 2019-08-01 NOTE — MAU Note (Signed)
I have communicated with Judeth Horn NP and reviewed vital signs:  Vitals:   08/01/19 1435 08/01/19 1518  BP: 125/76 119/63  Pulse: 83 79  Resp: 16 16  Temp: 98.2 F (36.8 C)   SpO2:  99%    Vaginal exam:  Dilation: 1 Effacement (%): Thick Cervical Position: Posterior Station: Ballotable Exam by:: Judeth Horn NP,   Also reviewed contraction pattern and that non-stress test is reactive.  It has been documented that patient had one contraction upon efm placement.  Patient denies any other complaints.  Based on this report provider has given order for discharge.  A discharge order and diagnosis entered by a provider.   Labor discharge instructions reviewed with patient.

## 2019-08-01 NOTE — MAU Note (Signed)
Pt reports to mau with c/o ctx for the past month with increased pressure today.  Pt denies LOF or vag bleeding and reports good fetal movement.

## 2019-08-05 ENCOUNTER — Encounter: Payer: Self-pay | Admitting: Obstetrics

## 2019-08-05 ENCOUNTER — Telehealth (INDEPENDENT_AMBULATORY_CARE_PROVIDER_SITE_OTHER): Payer: Medicaid Other | Admitting: Obstetrics

## 2019-08-05 DIAGNOSIS — Z3A39 39 weeks gestation of pregnancy: Secondary | ICD-10-CM

## 2019-08-05 DIAGNOSIS — D563 Thalassemia minor: Secondary | ICD-10-CM

## 2019-08-05 DIAGNOSIS — Z3480 Encounter for supervision of other normal pregnancy, unspecified trimester: Secondary | ICD-10-CM

## 2019-08-05 DIAGNOSIS — Z3483 Encounter for supervision of other normal pregnancy, third trimester: Secondary | ICD-10-CM

## 2019-08-05 NOTE — Progress Notes (Signed)
   TELEHEALTH OBSTETRICS PRENATAL VIRTUAL VIDEO VISIT ENCOUNTER NOTE  Provider location: [facility] [employer] [place]   I connected with Adolph Pollack on 08/05/19 at 10:15 AM EST by OB MyChart Video Encounter at home and verified that I am speaking with the correct person using two identifiers.   I discussed the limitations, risks, security and privacy concerns of performing an evaluation and management service virtually and the availability of in person appointments. I also discussed with the patient that there may be a patient responsible charge related to this service. The patient expressed understanding and agreed to proceed. Subjective:  [name] is a 34 y.o. [phone] at [redacted]w[redacted]d being seen today for ongoing prenatal care.  She is currently monitored for the following issues for this low-risk pregnancy and has Dental abscess; Supervision of normal pregnancy, antepartum; Anemia in pregnancy; Cervical high risk HPV (human papillomavirus) test positive; Alpha thalassemia silent carrier; and UTI (urinary tract infection) during pregnancy on their problem list.  Patient reports no complaints.  Contractions: Irregular. Vag. Bleeding: None.  Movement: Present. Denies any leaking of fluid.   The following portions of the patient's history were reviewed and updated as appropriate: allergies, current medications, past family history, past medical history, past social history, past surgical history and problem list.   Objective:  There were no vitals filed for this visit.  Fetal Status:     Movement: Present     General:  Alert, oriented and cooperative. Patient is in no acute distress.  Respiratory: Normal respiratory effort, no problems with respiration noted  Mental Status: Normal mood and affect. Normal behavior. Normal judgment and thought content.  Rest of physical exam deferred due to type of encounter  Imaging: No results found.  Assessment and Plan:  Pregnancy: [id number]  at [redacted]w[redacted]d 1. Supervision of other normal pregnancy, antepartum  2. Alpha thalassemia silent carrier   Term labor symptoms and general obstetric precautions including but not limited to vaginal bleeding, contractions, leaking of fluid and fetal movement were reviewed in detail with the patient. I discussed the assessment and treatment plan with the patient. The patient was provided an opportunity to ask questions and all were answered. The patient agreed with the plan and demonstrated an understanding of the instructions. The patient was advised to call back or seek an in-person office evaluation/go to [facility]U at [facility] for any urgent or concerning symptoms. Please refer to After Visit Summary for other counseling recommendations.   I provided 10 minutes of face-to-face time during this encounter.  Return in about 1 week (around 08/12/2019) for MyChart.    Coral Ceo, MD [facility], [facility] 08/05/2019

## 2019-08-10 ENCOUNTER — Inpatient Hospital Stay (HOSPITAL_COMMUNITY)
Admission: AD | Admit: 2019-08-10 | Discharge: 2019-08-10 | Disposition: A | Discharge status: Home / Self Care | Payer: Medicaid Other | Source: Home / Self Care | Attending: Obstetrics and Gynecology | Admitting: Obstetrics and Gynecology

## 2019-08-10 ENCOUNTER — Encounter (HOSPITAL_COMMUNITY): Payer: Self-pay | Admitting: Obstetrics and Gynecology

## 2019-08-10 ENCOUNTER — Inpatient Hospital Stay (HOSPITAL_COMMUNITY)
Admission: AD | Admit: 2019-08-10 | Discharge: 2019-08-12 | DRG: 807 | Disposition: A | Discharge status: Home / Self Care | Payer: Medicaid Other | Attending: Obstetrics and Gynecology | Admitting: Obstetrics and Gynecology

## 2019-08-10 ENCOUNTER — Other Ambulatory Visit: Payer: Self-pay

## 2019-08-10 DIAGNOSIS — O26853 Spotting complicating pregnancy, third trimester: Secondary | ICD-10-CM | POA: Insufficient documentation

## 2019-08-10 DIAGNOSIS — F17210 Nicotine dependence, cigarettes, uncomplicated: Secondary | ICD-10-CM | POA: Diagnosis present

## 2019-08-10 DIAGNOSIS — Z3A40 40 weeks gestation of pregnancy: Secondary | ICD-10-CM

## 2019-08-10 DIAGNOSIS — O471 False labor at or after 37 completed weeks of gestation: Secondary | ICD-10-CM | POA: Insufficient documentation

## 2019-08-10 DIAGNOSIS — O99324 Drug use complicating childbirth: Secondary | ICD-10-CM | POA: Diagnosis present

## 2019-08-10 DIAGNOSIS — O99334 Smoking (tobacco) complicating childbirth: Secondary | ICD-10-CM | POA: Diagnosis present

## 2019-08-10 DIAGNOSIS — O99013 Anemia complicating pregnancy, third trimester: Secondary | ICD-10-CM

## 2019-08-10 DIAGNOSIS — Z20822 Contact with and (suspected) exposure to covid-19: Secondary | ICD-10-CM | POA: Diagnosis present

## 2019-08-10 DIAGNOSIS — O26893 Other specified pregnancy related conditions, third trimester: Secondary | ICD-10-CM | POA: Diagnosis present

## 2019-08-10 DIAGNOSIS — Z3400 Encounter for supervision of normal first pregnancy, unspecified trimester: Secondary | ICD-10-CM

## 2019-08-10 DIAGNOSIS — D563 Thalassemia minor: Secondary | ICD-10-CM | POA: Diagnosis present

## 2019-08-10 DIAGNOSIS — Z3490 Encounter for supervision of normal pregnancy, unspecified, unspecified trimester: Secondary | ICD-10-CM

## 2019-08-10 DIAGNOSIS — O480 Post-term pregnancy: Secondary | ICD-10-CM | POA: Insufficient documentation

## 2019-08-10 DIAGNOSIS — F1290 Cannabis use, unspecified, uncomplicated: Secondary | ICD-10-CM | POA: Diagnosis present

## 2019-08-10 LAB — CBC
HCT: 37.2 % (ref 36.0–46.0)
Hemoglobin: 12.6 g/dL (ref 12.0–15.0)
MCH: 28.8 pg (ref 26.0–34.0)
MCHC: 33.9 g/dL (ref 30.0–36.0)
MCV: 85.1 fL (ref 80.0–100.0)
Platelets: 205 10*3/uL (ref 150–400)
RBC: 4.37 MIL/uL (ref 3.87–5.11)
RDW: 14.2 % (ref 11.5–15.5)
WBC: 15.1 10*3/uL — ABNORMAL HIGH (ref 4.0–10.5)
nRBC: 0 % (ref 0.0–0.2)

## 2019-08-10 LAB — TYPE AND SCREEN
ABO/RH(D): O POS
Antibody Screen: NEGATIVE

## 2019-08-10 LAB — COMPREHENSIVE METABOLIC PANEL
ALT: 19 U/L (ref 0–44)
AST: 27 U/L (ref 15–41)
Albumin: 2.9 g/dL — ABNORMAL LOW (ref 3.5–5.0)
Alkaline Phosphatase: 213 U/L — ABNORMAL HIGH (ref 38–126)
Anion gap: 13 (ref 5–15)
BUN: 7 mg/dL (ref 6–20)
CO2: 22 mmol/L (ref 22–32)
Calcium: 8.9 mg/dL (ref 8.9–10.3)
Chloride: 101 mmol/L (ref 98–111)
Creatinine, Ser: 0.87 mg/dL (ref 0.44–1.00)
GFR calc Af Amer: >60 mL/min (ref >60)
GFR calc non Af Amer: >60 mL/min (ref >60)
Glucose, Bld: 125 mg/dL — ABNORMAL HIGH (ref 70–99)
Potassium: 4.1 mmol/L (ref 3.5–5.1)
Sodium: 136 mmol/L (ref 135–145)
Total Bilirubin: 0.7 mg/dL (ref 0.3–1.2)
Total Protein: 6.9 g/dL (ref 6.5–8.1)

## 2019-08-10 LAB — RESPIRATORY PANEL BY RT PCR (FLU A&B, COVID)
Influenza A by PCR: NEGATIVE
Influenza B by PCR: NEGATIVE
SARS Coronavirus 2 by RT PCR: NEGATIVE

## 2019-08-10 LAB — ABO/RH: ABO/RH(D): O POS

## 2019-08-10 MED ORDER — TETANUS-DIPHTH-ACELL PERTUSSIS 5-2.5-18.5 LF-MCG/0.5 IM SUSP: 0.5000 mL | Freq: Once | INTRAMUSCULAR | Status: DC

## 2019-08-10 MED ORDER — ACETAMINOPHEN 325 MG PO TABS
650.0000 mg | ORAL_TABLET | ORAL | Status: DC | PRN
  Administered 2019-08-10 – 2019-08-11 (×3): 650 mg via ORAL
  Filled 2019-08-10 (×3): qty 2

## 2019-08-10 MED ORDER — LACTATED RINGERS IV SOLN: INTRAVENOUS | Status: DC

## 2019-08-10 MED ORDER — DIBUCAINE (PERIANAL) 1 % EX OINT: 1.0000 "application " | TOPICAL_OINTMENT | CUTANEOUS | Status: DC | PRN

## 2019-08-10 MED ORDER — OXYTOCIN BOLUS FROM INFUSION: 500.0000 mL | Freq: Once | INTRAVENOUS | Status: DC

## 2019-08-10 MED ORDER — BENZOCAINE-MENTHOL 20-0.5 % EX AERO: 1.0000 "application " | INHALATION_SPRAY | CUTANEOUS | Status: DC | PRN

## 2019-08-10 MED ORDER — OXYCODONE-ACETAMINOPHEN 5-325 MG PO TABS: 2.0000 | ORAL_TABLET | ORAL | Status: DC | PRN

## 2019-08-10 MED ORDER — FLEET ENEMA 7-19 GM/118ML RE ENEM: 1.0000 | ENEMA | RECTAL | Status: DC | PRN

## 2019-08-10 MED ORDER — WITCH HAZEL-GLYCERIN EX PADS: 1.0000 "application " | MEDICATED_PAD | CUTANEOUS | Status: DC | PRN

## 2019-08-10 MED ORDER — PRENATAL MULTIVITAMIN CH
1.0000 | ORAL_TABLET | Freq: Every day | ORAL | Status: DC
  Administered 2019-08-11: 1 via ORAL
  Filled 2019-08-10: qty 1

## 2019-08-10 MED ORDER — ONDANSETRON HCL 4 MG/2ML IJ SOLN: 4.0000 mg | Freq: Four times a day (QID) | INTRAMUSCULAR | Status: DC | PRN

## 2019-08-10 MED ORDER — OXYTOCIN 10 UNIT/ML IJ SOLN
INTRAMUSCULAR | Status: AC
  Administered 2019-08-10: 10 [IU]
  Filled 2019-08-10: qty 1

## 2019-08-10 MED ORDER — COCONUT OIL OIL: 1.0000 "application " | TOPICAL_OIL | Status: DC | PRN

## 2019-08-10 MED ORDER — OXYCODONE-ACETAMINOPHEN 5-325 MG PO TABS: 1.0000 | ORAL_TABLET | ORAL | Status: DC | PRN

## 2019-08-10 MED ORDER — OXYTOCIN 40 UNITS IN NORMAL SALINE INFUSION - SIMPLE MED: 2.5000 [IU]/h | INTRAVENOUS | Status: DC

## 2019-08-10 MED ORDER — ONDANSETRON HCL 4 MG/2ML IJ SOLN: 4.0000 mg | INTRAMUSCULAR | Status: DC | PRN

## 2019-08-10 MED ORDER — LIDOCAINE HCL (PF) 1 % IJ SOLN: 30.0000 mL | INTRAMUSCULAR | Status: DC | PRN

## 2019-08-10 MED ORDER — SOD CITRATE-CITRIC ACID 500-334 MG/5ML PO SOLN: 30.0000 mL | ORAL | Status: DC | PRN

## 2019-08-10 MED ORDER — TRAMADOL HCL 50 MG PO TABS
50.0000 mg | ORAL_TABLET | Freq: Four times a day (QID) | ORAL | Status: DC | PRN
  Administered 2019-08-11 – 2019-08-12 (×2): 50 mg via ORAL
  Filled 2019-08-10 (×3): qty 1

## 2019-08-10 MED ORDER — SENNOSIDES-DOCUSATE SODIUM 8.6-50 MG PO TABS
2.0000 | ORAL_TABLET | ORAL | Status: DC
  Filled 2019-08-10: qty 2

## 2019-08-10 MED ORDER — DIPHENHYDRAMINE HCL 25 MG PO CAPS: 25.0000 mg | ORAL_CAPSULE | Freq: Four times a day (QID) | ORAL | Status: DC | PRN

## 2019-08-10 MED ORDER — ACETAMINOPHEN 325 MG PO TABS: 650.0000 mg | ORAL_TABLET | ORAL | Status: DC | PRN

## 2019-08-10 MED ORDER — ONDANSETRON HCL 4 MG PO TABS
4.0000 mg | ORAL_TABLET | ORAL | Status: DC | PRN
  Administered 2019-08-11: 06:00:00 4 mg via ORAL
  Filled 2019-08-10: qty 1

## 2019-08-10 MED ORDER — SIMETHICONE 80 MG PO CHEW: 80.0000 mg | CHEWABLE_TABLET | ORAL | Status: DC | PRN

## 2019-08-10 MED ORDER — LACTATED RINGERS IV SOLN: 500.0000 mL | INTRAVENOUS | Status: DC | PRN

## 2019-08-10 MED ORDER — IBUPROFEN 600 MG PO TABS
600.0000 mg | ORAL_TABLET | Freq: Four times a day (QID) | ORAL | Status: DC
  Administered 2019-08-10 – 2019-08-12 (×7): 600 mg via ORAL
  Filled 2019-08-10 (×8): qty 1

## 2019-08-10 NOTE — MAU Note (Signed)
Pt is a G5P4 at 40.1 weeks c/o contractions for the last 2 days.  Contractions are frequent and consistent, with increased intensity today.  Pt has the feeling to have BM and N&V.  Spotting and mucus discharge.  No other OB or medical concerns.

## 2019-08-10 NOTE — Discharge Summary (Signed)
  Postpartum Discharge Summary  Patient Name: [name] DOB: 12/12/1985 MRN: [id number]  Date of admission: 08/10/2019 Delivering Provider: BHAMBRI, MELANIE   Date of discharge: 08/12/2019  Admitting diagnosis: Pregnancy [Z34.90] Intrauterine pregnancy: [redacted]w[redacted]d     Secondary diagnosis:  Active Problems:   Pregnancy   SVD (spontaneous vaginal delivery)  Additional problems: alpha thal carrier, UTI, anemia, pap with HR HPV     Discharge diagnosis: Term Pregnancy Delivered                                                                                                Post partum procedures:None  Augmentation: none  Complications: None  Hospital course:  Onset of Labor With Vaginal Delivery     33 y.o. yo G5P4014 at [redacted]w[redacted]d was admitted in Active Labor on 08/10/2019. Patient had an uncomplicated labor course as follows:  Membrane Rupture Time/Date: 4:10 PM ,08/10/2019   Intrapartum Procedures: Episiotomy: None [1]                                         Lacerations:  1st degree [2]  Patient had a delivery of a Viable infant. 08/10/2019  Information for the patient's newborn:  [name], Boy Trezure [[id number]]  Delivery Method: Vag-Spont     Pateint had an uncomplicated postpartum course.  She is ambulating, tolerating a regular diet, passing flatus, and urinating well. Patient is discharged home in stable condition on 08/12/19.  Delivery time: 4:45 PM    Magnesium Sulfate received: No BMZ received: No Rhophylac:N/A MMR:N/A Transfusion:No  Physical exam  Vitals:   08/11/19 0918 08/11/19 1427 08/11/19 2122 08/12/19 0554  BP: 132/81 126/84 134/84 124/85  Pulse:  79 68 70  Resp: 18 16 17 18  Temp: 98.4 F (36.9 C) 98.6 F (37 C) 98.9 F (37.2 C) 98.6 F (37 C)  TempSrc: Oral Oral Oral Oral  SpO2: 99%  100% 100%   General: alert, cooperative and no distress Lochia: appropriate Uterine Fundus: firm Incision: N/A DVT Evaluation: No evidence of DVT seen on physical  exam. Labs: Lab Results  Component Value Date   WBC 15.1 (H) 08/10/2019   HGB 12.6 08/10/2019   HCT 37.2 08/10/2019   MCV 85.1 08/10/2019   PLT 205 08/10/2019   CMP Latest Ref Rng & Units 08/10/2019  Glucose 70 - 99 mg/dL 125(H)  BUN 6 - 20 mg/dL 7  Creatinine 0.44 - 1.00 mg/dL 0.87  Sodium 135 - 145 mmol/L 136  Potassium 3.5 - 5.1 mmol/L 4.1  Chloride 98 - 111 mmol/L 101  CO2 22 - 32 mmol/L 22  Calcium 8.9 - 10.3 mg/dL 8.9  Total Protein 6.5 - 8.1 g/dL 6.9  Total Bilirubin 0.3 - 1.2 mg/dL 0.7  Alkaline Phos 38 - 126 U/L 213(H)  AST 15 - 41 U/L 27  ALT 0 - 44 U/L 19    Discharge instruction: per After Visit Summary and "Baby and Me Booklet".  After visit meds:  Allergies as of 08/12/2019        Reactions   Pollen Extract Hives      Medication List    TAKE these medications   acetaminophen 325 MG tablet Commonly known as: Tylenol Take 2 tablets (650 mg total) by mouth every 4 (four) hours as needed (for pain scale < 4).   Blood Pressure Monitor Kit 1 kit by Does not apply route once a week. Check BP weekly.  Regular Cuff.  DX:  Z13.6         Z34.86   ibuprofen 600 MG tablet Commonly known as: ADVIL Take 1 tablet (600 mg total) by mouth every 6 (six) hours.   PrePLUS 27-1 MG Tabs Take 1 tablet by mouth daily.       Diet: routine diet  Activity: Advance as tolerated. Pelvic rest for 6 weeks.   Outpatient follow up:6 weeks Follow up Appt:No future appointments. Follow up Visit:  Please schedule this patient for Postpartum visit in: 6 weeks with the following provider: Any provider Virtual For C/S patients schedule nurse incision check in weeks 2 weeks: no Low risk pregnancy complicated by: HTN Delivery mode:  SVD Anticipated Birth Control:  other/unsure PP Procedures needed: BP check  Schedule Integrated BH visit: no  Newborn Data: Live born female  Birth Weight: 7 lb 7.9 oz (3400 g) APGAR: 9, 9  Newborn Delivery   Birth date/time: 08/10/2019  16:45:00 Delivery type: Vaginal, Spontaneous      Baby Feeding: Bottle Disposition:home with mother   08/12/2019 Marylouise Stacks   I confirm that I have verified the information documented in the resident's note and that I have also personally reperformed the history, physical exam and all medical decision making activities of this service and have verified that all service and findings are accurately documented in this student's note.   -Reviewed postpartum care instructions including monitoring of blood pressure, birth control options, and pain control. -Encouraged to discuss birth control at 4-6 week PPV.   Gavin Pound, CNM 08/12/2019 9:09 AM

## 2019-08-10 NOTE — Progress Notes (Signed)
[name] is a 34 y.o. [phone] female at [redacted]w[redacted]d  RN Labor check, not seen by provider SVE by RN: Dilation: 1.5 Effacement (%): Thick Station: -3 Exam by:: Polos RN FHR baseline 145 bpm, Variability: moderate, Accelerations:present, Decelerations:  Absent= Cat I Toco: poor tracing  She declines recheck, states she would like to go home Strip Cat I, labor precautions reviewed by RN D/C home  Venora Maples MD/MPH 08/10/2019 9:53 AM

## 2019-08-10 NOTE — H&P (Addendum)
LABOR AND DELIVERY ADMISSION HISTORY AND PHYSICAL NOTE  [name] is a 34 y.o. female [phone] with IUP at 34w1dpresenting for SOL. Presented at 0800 this AM to MAU and SVE at that time was significant for dilation of 1.5 and -3 station by RN evaluation, and had Cat I trip with poor toco. Patient declined recheck at that time and requested to go home, and was subsequently discharged to home.  Patient reports having worsening contractions upon arrival to home and was unable to tolerate PO due to overall discomfort all day. She presented once again to MAU due to worsening contractions. Patient had SROM while in lobby at 1610, clear fluid. Patient immediately had SVE by MAU CNM and was found to be 10/+3 with the urge to push. She was subsequently brought to L&D for labor management.  She reports positive fetal movement. Denies headaches, vision changes, dyspnea, or lower extremity edema.   Prenatal History/Complications: Pregnancy complicated by anemia in pregnancy, marijuana use in pregnancy and UTI. 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   Past Medical History: Past Medical History:  Diagnosis Date  . History of reversal of tubal ligation 09/2018    Past Surgical History: Past Surgical History:  Procedure Laterality Date  . TUBAL LIGATION  2008  . Tubal reversal  09/2018    Obstetrical History: OB History    Gravida  5   Para  3   Term  3   Preterm      AB  1   Living  3     SAB  1   TAB      Ectopic      Multiple      Live Births  3           Social History: Social History   Socioeconomic History  . Marital status: Married    Spouse name: Not on file  . Number of children: Not on file  . Years of education: Not on file  . Highest education level: Not on file  Occupational History  . Not on file  Tobacco Use  . Smoking status: Current Some Day Smoker    Packs/day: 0.25    Types: Cigarettes  . Smokeless tobacco: Never  Used  . Tobacco comment: Pt states she hasn't smoked in a month  Substance and Sexual Activity  . Alcohol use: No  . Drug use: Not Currently    Types: Marijuana    Comment: no use x 2wks   . Sexual activity: Yes  Other Topics Concern  . Not on file  Social History Narrative  . Not on file   Social Determinants of Health   Financial Resource Strain:   . Difficulty of Paying Living Expenses: Not on file  Food Insecurity:   . Worried About RCharity fundraiserin the Last Year: Not on file  . Ran Out of Food in the Last Year: Not on file  Transportation Needs:   . Lack of Transportation (Medical): Not on file  . Lack of Transportation (Non-Medical): Not on file  Physical Activity:   . Days of Exercise per Week: Not on file  . Minutes of Exercise per Session: Not on file  Stress:   . Feeling of Stress : Not on file  Social Connections:   . Frequency of Communication with Friends and Family: Not on file  . Frequency of Social Gatherings with Friends and Family: Not on file  . Attends Religious Services: Not  on file  . Active Member of Clubs or Organizations: Not on file  . Attends Archivist Meetings: Not on file  . Marital Status: Not on file    Family History: Family History  Problem Relation Age of Onset  . Healthy Mother   . Healthy Father     Allergies: Allergies  Allergen Reactions  . Pollen Extract Hives    Medications Prior to Admission  Medication Sig Dispense Refill Last Dose  . Blood Pressure Monitor KIT 1 kit by Does not apply route once a week. Check BP weekly.  Regular Cuff.  DX:  Z13.6         Z34.86 1 kit 0   . Prenatal Vit-Fe Fumarate-FA (PREPLUS) 27-1 MG TABS Take 1 tablet by mouth daily. (Patient not taking: Reported on 08/05/2019) 30 tablet 13      Review of Systems   All systems reviewed and negative except as stated in HPI  Blood pressure (!) 146/74, pulse 73, last menstrual period 11/02/2018. General appearance: alert and mild  distress Lungs: clear to auscultation bilaterally Heart: regular rate and rhythm Abdomen: soft, non-tender; bowel sounds normal Extremities: No calf swelling or tenderness Presentation: vertex Fetal monitoring: mod variability, baseline HR 140, +acels, no decels Uterine activity: q3-59mn Dilation: 10 Station: (svd of female) Exam by:: dawson cnm  Prenatal labs: ABO, Rh: O/Positive/-- (07/10 06803 Antibody: Negative (07/10 0937) Rubella: 3.38 (07/10 0937) RPR: Non Reactive (10/29 0901)  HBsAg: Negative (07/10 02122  HIV: Non Reactive (10/29 0901)  GBS: --/[place](12/23 1058)  2 hr Glucola: WNL Genetic screening:  Abnormal cell free DNA, high risk for tripoloidy/trisomy 18/trisomy 13 Anatomy [place] WNL 05/2019  Nursing Staff Provider  Office Location  [place] Dating  LMP c/w early u/s  Language  ENGLISH  Anatomy [place]   Flu Vaccine  Declined 05/21/19 Genetic Screen  NIPS:  High risk for T13    AFP: declines  TDaP vaccine   Declined 05/21/19 Hgb A1C or  GTT  Third trimester   Rhogam   NA   LAB RESULTS   Feeding Plan  BOTH  Blood Type O/Positive/-- (07/10 04825   Contraception  UNDECIDED  Antibody Negative (07/10 0937)  Circumcision  Yes, outpt Rubella 3.38 (07/10 0937)  Pediatrician   [facility]  PEDS RPR Non Reactive (07/10 0937)   Support Person  HUSBAND  HBsAg Negative (07/10 0937)   Prenatal Classes  NO  HIV Non Reactive (07/10 0937)  BTL Consent  GBS   negative  VBAC Consent  n/a Pap  01/30/19 +HPV    Hgb Electro    BP Cuff Ordered 7/10 CF     SMA     Waterbirth  _0  Class _1  Consent _2  CNM visit    Prenatal Transfer Tool  Maternal Diabetes: No Genetic Screening: Abnormal:  Results: Elevated risk of Trisomy 18, Other: high risk Trisomy 13 Maternal Ultrasounds/Referrals: Normal Fetal Ultrasounds or other Referrals:  Referred to Materal Fetal Medicine  Maternal Substance Abuse:  Yes:  Type: Marijuana Significant Maternal Medications:  None Significant Maternal Lab  Results: Group B Strep negative   No results found for this or any previous visit (from the past 24 hour(s)).  Patient Active Problem List   Diagnosis Date Noted  . Pregnancy 08/10/2019  . SVD (spontaneous vaginal delivery) 08/10/2019  . UTI (urinary tract infection) during pregnancy 06/11/2019  . Alpha thalassemia silent carrier 02/12/2019  . Cervical high risk HPV (human papillomavirus) test positive 02/11/2019  .  Anemia in pregnancy 02/03/2019  . Supervision of normal pregnancy, antepartum 01/14/2019  . Dental abscess 03/31/2017   Assessment: [name] is a 34 y.o. [id number] at 16w1dhere for SOL and admitted for labor management  #Labor: SROM, baby vertex, 10cm/100%/+3 w/ urge to push. No epidural. Standard labor management for VD. #Pain: NA #FWB: Cat II, unknown EFW #ID: GBS neg #MOF: bottle #MOC: TBD #Circ: desires, outpatient  ABud Face DO, PGY-1 OBGYN Faculty Teaching Service  08/10/2019, 5:20 PM   Midwife attestation: I have seen and examined this patient; I agree with above documentation in the resident's note.   PE: Gen: calm comfortable, NAD Resp: normal effort and rate Abd: gravid  ROS, labs, PMH reviewed  Assessment/Plan: Nichole WEDELis a 34y.o. [id number] for active labor Admit to LD Labor: active, second stage FWB: Cat II GBS neg Anticipate precipitous VD  MJulianne Handler CNM  08/10/2019, 6:01 PM

## 2019-08-10 NOTE — MAU Note (Signed)
Registration called RNs to lobby, pt ruptured and feeling the urge to push. Pt brought directly to room 120. SROM at 1610, clear fluid.  1614 R Dawson CNM to bedside, pt 10 cm  1617 Sharon Seller RN notified of pt, room 217  1619 Donette Larry CNM notified of patient  Pt to room 217 via stretcher

## 2019-08-11 LAB — RPR: RPR Ser Ql: NONREACTIVE

## 2019-08-11 NOTE — Clinical Social Work Maternal (Signed)
CLINICAL SOCIAL WORK MATERNAL/CHILD NOTE  Patient Details  Name: [name] MRN: [id number] Date of Birth: 01-24-1986  Date:  08/11/2019  Clinical Social Worker Initiating Note:  Elijio Miles Date/Time: Initiated:  08/11/19/1012     Child's Name:  [name]   Biological Parents:  Mother, Father([name] and [name] DOB: 1985/06/23)   Need for Interpreter:  None   Reason for Referral:  Current Substance Use/Substance Use During Pregnancy    Address:  [place] [address]    Phone number:  [phone] (home)     Additional phone number:   Household Members/Support Persons (HM/SP):   Household Member/Support Person 1, Household Member/Support Person 2, Household Member/Support Person 3   HM/SP Name Relationship DOB or Age  HM/SP -1 [name] Daughter 01/17/2001  HM/SP -2 [name] Son 05/16/2003  HM/SP -3 [name] Son 04/05/2007  HM/SP -4        HM/SP -5        HM/SP -6        HM/SP -7        HM/SP -8          Natural Supports (not living in the home):  Spouse/significant other, Parent, Extended Family, Children   Professional Supports: None   Employment: Unemployed   Type of Work:     Education:  [place] arranged:    Museum/gallery curator Resources:  [employer]   Other Resources:  Physicist, medical , [place] Considerations Which May Impact Care:    Strengths:  Ability to meet basic needs , Home prepared for child , Pediatrician chosen   Psychotropic Medications:         Pediatrician:    [place]  Pediatrician List:   [place]      Pediatrician Fax Number:    Risk Factors/Current Problems:  Substance Use    Cognitive State:  Able to Concentrate , Alert , Linear Thinking    Mood/Affect:  Calm , Comfortable , Interested , Happy ,  Relaxed    CSW Assessment:  CSW received consult for THC use during pregnancy.  CSW met with MOB to offer support and complete assessment.    MOB sitting up in bed holding infant with FOB present at bedside, when CSW entered the room. CSW introduced self and noted FOB getting ready to leave. CSW waited until FOB left the room to complete assessment. CSW explained reason for consult to which MOB expressed understanding. MOB reported she currently lives in [place] with her three children. MOB confirmed she receives both [facility] and food stamps and was encouraged to reach out to both to update them of her delivery. CSW inquired about MOB's mental health history and MOB denied having any and denied any history of PPD/A. CSW provided education regarding the baby blues period vs. perinatal mood disorders, discussed treatment and gave resources for mental health follow up if concerns arise. CSW recommended self-evaluation during the postpartum time period using the New Mom Checklist from Postpartum Progress and encouraged MOB to contact a medical professional if symptoms are noted at any time. MOB did not appear to be displaying any acute mental health symptoms and denied any SI, HI or DV. MOB reported having a good support system consisting of FOB, her kids, her mom, her grandparents and  her dad. MOB confirmed having all essential items for infant once discharged and stated infant would be sleeping in a crib once home. CSW provided review of Sudden Infant Death Syndrome (SIDS) precautions and safe sleeping habits.    CSW inquired about MOB's substance use during pregnancy and MOB acknowledged using marijuana early in her pregnancy to help with her nausea and vomiting. MOB unable to recall when last use was but stated it had been a while. CSW informed MOB of Hospital Drug Policy and explained UDS was positive for [facility] and that CDS was still pending. MOB unsure as to why baby would test positive as she had not  used in a while but did acknowledge potential second hand exposure. CSW explained [facility] CPS report would be made due to positive UDS. MOB reported displeasure with CPS report being made but was appropriate and denied any further questions or concerns.   CSW made [facility] CPS report due to infant's positive UDS for THC. No barriers to discharge, at this time. CPS to follow up within 72 hours.  CSW Plan/Description:  No Further Intervention Required/No Barriers to Discharge, Sudden Infant Death Syndrome (SIDS) Education, Perinatal Mood and Anxiety Disorder (PMADs) Education, [place], CSW Will Continue to Monitor Umbilical Cord Tissue Drug Screen Results and Make Report if [name], Child Protective Service Report     Elijio Miles, LCSW 08/11/2019, 11:25 AM

## 2019-08-11 NOTE — Progress Notes (Addendum)
POSTPARTUM PROGRESS NOTE  Post Partum Day 1  Subjective:  MEAGAN ANCONA is a 34 y.o. [id number] s/p SVD at [redacted]w[redacted]d.  She reports she is doing well. No acute events overnight. She denies any problems with ambulating, voiding or po intake. Denies nausea or vomiting.  Pain is well controlled.  Lochia is appropriate.  Objective: Blood pressure 138/90, pulse 74, temperature 98.2 F (36.8 C), temperature source Oral, resp. rate 18, last menstrual period 11/02/2018, SpO2 100 %, unknown if currently breastfeeding.  Physical Exam:  General: alert, cooperative and no distress Chest: no respiratory distress Heart:regular rate, distal pulses intact Abdomen: soft, nontender,  Uterine Fundus: firm, appropriately tender DVT Evaluation: No calf swelling or tenderness Extremities: no edema Skin: warm, dry  Recent Labs    08/10/19 1731  HGB 12.6  HCT 37.2    Assessment/Plan: ARANTXA PIERCEY is a 34 y.o. [id number] s/p SVD at [redacted]w[redacted]d   PPD#1 - Doing well. Continue routine postpartum care. Bps have been well controlled and all less than 140/90 since delivery. Contraception: undecided, would like to avoid LARCs Feeding: bottle Dispo: Plan for discharge today or tomorrow.   LOS: 1 day    Lorri Frederick, DO, PGY-1 OBGYN Faculty Teaching Service  08/11/2019, 6:51 AM

## 2019-08-12 ENCOUNTER — Encounter: Payer: Medicaid Other | Admitting: Obstetrics

## 2019-08-12 MED ORDER — IBUPROFEN 600 MG PO TABS: 600.0000 mg | ORAL_TABLET | Freq: Four times a day (QID) | ORAL | 0 refills | Status: DC

## 2019-08-12 MED ORDER — ACETAMINOPHEN 325 MG PO TABS: 650.0000 mg | ORAL_TABLET | ORAL | 0 refills | Status: DC | PRN

## 2019-08-17 ENCOUNTER — Ambulatory Visit: Payer: Medicaid Other

## 2019-08-17 ENCOUNTER — Other Ambulatory Visit: Payer: Self-pay

## 2019-08-17 VITALS — BP 158/96 | HR 65

## 2019-08-17 DIAGNOSIS — Z0130 Encounter for examination of blood pressure without abnormal findings: Secondary | ICD-10-CM

## 2019-08-17 MED ORDER — HYDROCHLOROTHIAZIDE 25 MG PO TABS: 25.0000 mg | ORAL_TABLET | Freq: Every day | ORAL | 0 refills | Status: DC

## 2019-08-17 NOTE — Progress Notes (Signed)
Patient seen and assessed by nursing staff during this encounter. I have reviewed the chart and agree with the documentation and plan.  Kairee Kozma, MD 08/17/2019 11:49 AM    

## 2019-08-17 NOTE — Progress Notes (Signed)
..  Subjective:  [name] is a 34 y.o. female here for BP check.   Hypertension ROS: taking medications as instructed, no medication side effects noted, no TIA's, no chest pain on exertion, no dyspnea on exertion, no swelling of ankles and Patient is currently not taking any medications and denies any abnormal symptoms..    Objective:  BP (!) 158/96   Pulse 65   LMP 11/02/2018   Breastfeeding No   Appearance alert, well appearing, and in no distress. General exam BP noted to be well controlled today in office.    Assessment:   Blood Pressure needs improvement.   Plan:  Orders and follow up as documented in patient record. Provider order BP rx for patient to start and follow up at pp visit on 09-07-19. Pt agreed.

## 2019-09-07 ENCOUNTER — Encounter: Payer: Self-pay | Admitting: Obstetrics and Gynecology

## 2019-09-07 ENCOUNTER — Telehealth (INDEPENDENT_AMBULATORY_CARE_PROVIDER_SITE_OTHER): Payer: Medicaid Other | Admitting: Obstetrics and Gynecology

## 2019-09-07 DIAGNOSIS — Z30011 Encounter for initial prescription of contraceptive pills: Secondary | ICD-10-CM

## 2019-09-07 MED ORDER — SLYND 4 MG PO TABS: 1.0000 | ORAL_TABLET | Freq: Every day | ORAL | 4 refills | Status: DC

## 2019-09-07 NOTE — Progress Notes (Signed)
TELEHEALTH POSTPARTUM VIRTUAL VIDEO VISIT ENCOUNTER NOTE   Provider location: [facility] [place]   I connected with [name] on 09/07/19 at  9:15 AM EST by MyChart Video Encounter at home and verified that I am speaking with the correct person using two identifiers.    I discussed the limitations, risks, security and privacy concerns of performing an evaluation and management service virtually and the availability of in person appointments. I also discussed with the patient that there may be a patient responsible charge related to this service. The patient expressed understanding and agreed to proceed.  Subjective:     [name] is a 34 y.o. female who presents for a postpartum visit. She is 4 weeks postpartum following a spontaneous vaginal delivery. I have fully reviewed the prenatal and intrapartum course. The delivery was at 40.1 gestational weeks. Outcome: spontaneous vaginal delivery. Anesthesia: none. Postpartum course has been doing well. Baby's course has been doing well. Baby is feeding by bottle - [name]. Bleeding staining only. Bowel function is normal. Bladder function is normal. Patient is not sexually active. Contraception method is abstinence. Interested in [place]. Postpartum depression screening: negative.   Review of Systems Pertinent items noted in HPI and remainder of comprehensive ROS otherwise negative.   Medications [name] had no medications administered during this visit. Current Outpatient Medications  Medication Sig Dispense Refill  . acetaminophen (TYLENOL) 325 MG tablet Take 2 tablets (650 mg total) by mouth every 4 (four) hours as needed (for pain scale < 4). (Patient not taking: Reported on 08/17/2019) 30 tablet 0  . Blood Pressure Monitor KIT 1 kit by Does not apply route once a week. Check BP weekly.  Regular Cuff.  DX:  Z13.6         Z34.86 (Patient not taking: Reported on 08/17/2019) 1 kit 0  .  hydrochlorothiazide (HYDRODIURIL) 25 MG tablet Take 1 tablet (25 mg total) by mouth daily. 30 tablet 0  . ibuprofen (ADVIL) 600 MG tablet Take 1 tablet (600 mg total) by mouth every 6 (six) hours. (Patient not taking: Reported on 08/17/2019) 30 tablet 0  . Prenatal Vit-Fe Fumarate-FA (PREPLUS) 27-1 MG TABS Take 1 tablet by mouth daily. (Patient not taking: Reported on 08/05/2019) 30 tablet 13   No current facility-administered medications for this visit.    Allergies Pollen extract   PP Depression Screening:   Edinburgh Postnatal Depression Scale Screening Tool 08/11/2019  I have been able to laugh and see the funny side of things. 0  I have looked forward with enjoyment to things. 0  I have blamed myself unnecessarily when things went wrong. 1  I have been anxious or worried for no good reason. 1  I have felt scared or panicky for no good reason. 1  Things have been getting on top of me. 0  I have been so unhappy that I have had difficulty sleeping. 0  I have felt sad or miserable. 0  I have been so unhappy that I have been crying. 0  The thought of harming myself has occurred to me. 0  Edinburgh Postnatal Depression Scale Total 3    Objective:    BP (!) 125/92   LMP 11/02/2018   General:  Alert, oriented and cooperative. Patient is in no acute distress.  Mental Status: Normal mood and affect. Normal behavior. Normal judgment and thought content.   Respiratory: Normal respiratory effort noted, no problems with respiration noted  Rest  of physical exam deferred due to type of encounter Assessment:     Normal postpartum exam. Pap smear not done at today's visit.   Plan:    1. Contraception: oral progesterone-only contraceptive 2. Patient is medically cleared to resume all activities of daily living 3. Follow up in: 6 months for annual exam or as needed.     I discussed the assessment and treatment plan with the patient. The patient was provided an opportunity to ask questions  and all were answered. The patient agreed with the plan and demonstrated an understanding of the instructions.   The patient was advised to call back or seek an in-person evaluation/go to the ED for any concerning postpartum symptoms.  I provided 15 minutes of face-to-face time during this encounter.   Mora Bellman, MD [facility]

## 2019-12-30 ENCOUNTER — Ambulatory Visit (HOSPITAL_COMMUNITY)
Admission: EM | Admit: 2019-12-30 | Discharge: 2019-12-30 | Disposition: A | Discharge status: Home / Self Care | Payer: Medicaid Other | Attending: Family Medicine | Admitting: Family Medicine

## 2019-12-30 ENCOUNTER — Encounter (HOSPITAL_COMMUNITY): Payer: Self-pay

## 2019-12-30 ENCOUNTER — Other Ambulatory Visit: Payer: Self-pay

## 2019-12-30 DIAGNOSIS — M898X1 Other specified disorders of bone, shoulder: Secondary | ICD-10-CM

## 2019-12-30 MED ORDER — IBUPROFEN 600 MG PO TABS: 600.0000 mg | ORAL_TABLET | Freq: Three times a day (TID) | ORAL | 0 refills | Status: DC | PRN
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
## 2019-12-30 NOTE — Discharge Instructions (Signed)
Please try heat  Please try the exercises  Please follow up if your symptoms fail to improve.  
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
## 2020-01-22 ENCOUNTER — Ambulatory Visit (HOSPITAL_COMMUNITY)
Admission: EM | Admit: 2020-01-22 | Discharge: 2020-01-22 | Disposition: A | Discharge status: Home / Self Care | Payer: Medicaid Other | Attending: Family Medicine | Admitting: Family Medicine

## 2020-01-22 ENCOUNTER — Encounter (HOSPITAL_COMMUNITY): Payer: Self-pay | Admitting: Gynecology

## 2020-01-22 ENCOUNTER — Other Ambulatory Visit: Payer: Self-pay

## 2020-01-22 DIAGNOSIS — N760 Acute vaginitis: Secondary | ICD-10-CM

## 2020-01-22 MED ORDER — FLUCONAZOLE 150 MG PO TABS: 150.0000 mg | ORAL_TABLET | Freq: Every day | ORAL | 0 refills | Status: DC

## 2020-01-22 MED ORDER — METRONIDAZOLE 500 MG PO TABS: 500.0000 mg | ORAL_TABLET | Freq: Two times a day (BID) | ORAL | 0 refills | Status: DC

## 2020-01-22 NOTE — Discharge Instructions (Addendum)
Take metronidazole 1 pill 2 times a day This is for bacterial vaginosis Take the Diflucan 1 pill today, then 1 pill again at the end of the 7 days This will take care of any yeast infection

## 2020-01-22 NOTE — ED Provider Notes (Signed)
[facility]    CSN: [id number] Arrival date & time: 01/22/20  1929      History   Chief Complaint Chief Complaint  Patient presents with   Vaginal Discharge    HPI  [name] is a 34 y.o. female.   HPI    [name] states that she has had a vaginal infection for 1 week She denies any exposure to STD She states that she does not have any abdominal pain or fever She is certain that she is not pregnant.  She is taking her birth control She states that she has had both recurring BV and yeast infections She would like to be treated for these  Past Medical History:  Diagnosis Date   History of reversal of tubal ligation 09/2018    Patient Active Problem List   Diagnosis Date Noted   Pregnancy 08/10/2019   SVD (spontaneous vaginal delivery) 08/10/2019   UTI (urinary tract infection) during pregnancy 06/11/2019   Alpha thalassemia silent carrier 02/12/2019   Cervical high risk HPV (human papillomavirus) test positive 02/11/2019   Anemia in pregnancy 02/03/2019   Supervision of normal pregnancy, antepartum 01/14/2019   Dental abscess 03/31/2017    Past Surgical History:  Procedure Laterality Date   TUBAL LIGATION  2008   Tubal reversal  09/2018    OB History    Gravida  5   Para  4   Term  4   Preterm      AB  1   Living  4     SAB  1   TAB      Ectopic      Multiple  0   Live Births  4            Home Medications    Prior to Admission medications   Medication Sig Start Date End Date Taking? Authorizing Provider  Drospirenone (SLYND) 4 MG TABS Take 1 tablet by mouth daily. 09/07/19   Constant, Peggy, MD  fluconazole (DIFLUCAN) 150 MG tablet Take 1 tablet (150 mg total) by mouth daily. Repeat in 1 week if needed 01/22/20   Eustace Moore, MD  ibuprofen (ADVIL) 600 MG tablet Take 1 tablet (600 mg total) by mouth every 8 (eight) hours as needed. 12/30/19   Myra Rude, MD  metroNIDAZOLE (FLAGYL) 500 MG tablet  Take 1 tablet (500 mg total) by mouth 2 (two) times daily. 01/22/20   Eustace Moore, MD  hydrochlorothiazide (HYDRODIURIL) 25 MG tablet Take 1 tablet (25 mg total) by mouth daily. 08/17/19 12/30/19  Constant, Peggy, MD    Family History Family History  Problem Relation Age of Onset   Healthy Mother    Healthy Father     Social History Social History   Tobacco Use   Smoking status: Current Some Day Smoker    Packs/day: 0.25    Types: Cigarettes   Smokeless tobacco: Never Used   Tobacco comment: Pt states she hasn't smoked in a month  Vaping Use   Vaping Use: Never used  Substance Use Topics   Alcohol use: No   Drug use: Not Currently    Types: Marijuana    Comment: no use x 2wks      Allergies   Pollen extract   Review of Systems Review of Systems  See HPI Physical Exam Triage Vital Signs ED Triage Vitals  Enc Vitals Group     BP 01/22/20 2007 119/75     Pulse Rate 01/22/20  2007 75     Resp 01/22/20 2007 16     Temp 01/22/20 2007 98.6 F (37 C)     Temp src --      SpO2 01/22/20 2007 100 %     Weight 01/22/20 2010 120 lb (54.4 kg)     Height 01/22/20 2010 5\' 4"  (1.626 m)     Head Circumference --      Peak Flow --      Pain Score 01/22/20 2049 0     Pain Loc --      Pain Edu? --      Excl. in GC? --    No data found.  Updated Vital Signs BP 119/75 (BP Location: Left Arm)    Pulse 75    Temp 98.6 F (37 C)    Resp 16    Ht 5\' 4"  (1.626 m)    Wt 54.4 kg    LMP 01/20/2020    SpO2 100%    BMI 20.60 kg/m     Physical Exam Constitutional:      General: She is not in acute distress.    Appearance: She is well-developed.     Comments: thin  HENT:     Head: Normocephalic and atraumatic.     Mouth/Throat:     Comments: mask Eyes:     Conjunctiva/sclera: Conjunctivae normal.     Pupils: Pupils are equal, round, and reactive to light.  Cardiovascular:     Rate and Rhythm: Normal rate.  Pulmonary:     Effort: Pulmonary effort is normal. No  respiratory distress.  Abdominal:     General: There is no distension.     Palpations: Abdomen is soft.  Musculoskeletal:        General: Normal range of motion.     Cervical back: Normal range of motion.  Skin:    General: Skin is warm and dry.  Neurological:     Mental Status: She is alert.  Psychiatric:        Mood and Affect: Mood normal.        Behavior: Behavior normal.    Declines vaginal testing  UC Treatments / Results  Labs (all labs ordered are listed, but only abnormal results are displayed) Labs Reviewed - No data to display  EKG   Radiology No results found.  Procedures Procedures (including critical care time)  Medications Ordered in UC Medications - No data to display  Initial Impression / Assessment and Plan / UC Course  I have reviewed the triage vital signs and the nursing notes.  Pertinent labs & imaging results that were available during my care of the patient were reviewed by me and considered in my medical decision making (see chart for details).      Final Clinical Impressions(s) / UC Diagnoses   Final diagnoses:  Acute vaginitis     Discharge Instructions     Take metronidazole 1 pill 2 times a day This is for bacterial vaginosis Take the Diflucan 1 pill today, then 1 pill again at the end of the 7 days This will take care of any yeast infection    ED Prescriptions    Medication Sig Dispense Auth. Provider   metroNIDAZOLE (FLAGYL) 500 MG tablet Take 1 tablet (500 mg total) by mouth 2 (two) times daily. 14 tablet , MD   fluconazole (DIFLUCAN) 150 MG tablet Take 1 tablet (150 mg total) by mouth daily. Repeat in 1 week if needed 2 tablet 01/22/2020,  Letta Pate, MD     PDMP not reviewed this encounter.   Eustace Moore, MD 01/24/20 1145

## 2020-01-22 NOTE — ED Triage Notes (Signed)
Patient c/o whitish vaginal discharge and odor x 1 week.

## 2020-04-06 ENCOUNTER — Ambulatory Visit (HOSPITAL_COMMUNITY)
Admission: EM | Admit: 2020-04-06 | Discharge: 2020-04-06 | Disposition: A | Discharge status: Home / Self Care | Payer: Medicaid Other | Attending: Family Medicine | Admitting: Family Medicine

## 2020-04-06 ENCOUNTER — Other Ambulatory Visit: Payer: Self-pay

## 2020-04-06 DIAGNOSIS — Z3490 Encounter for supervision of normal pregnancy, unspecified, unspecified trimester: Secondary | ICD-10-CM | POA: Diagnosis not present

## 2020-04-06 DIAGNOSIS — S66212A Strain of extensor muscle, fascia and tendon of left thumb at wrist and hand level, initial encounter: Secondary | ICD-10-CM | POA: Diagnosis present

## 2020-04-06 LAB — POCT URINALYSIS DIPSTICK, ED / UC
Bilirubin Urine: NEGATIVE
Glucose, UA: NEGATIVE mg/dL
Hgb urine dipstick: NEGATIVE
Leukocytes,Ua: NEGATIVE
Nitrite: POSITIVE — AB
Protein, ur: NEGATIVE mg/dL
Specific Gravity, Urine: >=1.03 (ref 1.005–1.030)
Urobilinogen, UA: 0.2 mg/dL (ref 0.0–1.0)
pH: 5.5 (ref 5.0–8.0)

## 2020-04-06 LAB — POC URINE PREG, ED: Preg Test, Ur: POSITIVE — AB
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
## 2020-04-09 LAB — URINE CULTURE: Culture: >=100000 — AB

## 2020-04-10 ENCOUNTER — Telehealth: Payer: Self-pay | Admitting: *Deleted

## 2020-04-10 MED ORDER — CEPHALEXIN 500 MG PO CAPS: 500.0000 mg | ORAL_CAPSULE | Freq: Four times a day (QID) | ORAL | 0 refills | Status: DC

## 2020-04-10 NOTE — Telephone Encounter (Signed)
[name],  We have received the results of your urine culture. Your urine culture does show bacteria growth. Per protocol in pregnancy the prescription being sent to [facility] on [address] is Keflex 500mg  4 times a day for 5 days.   Please complete the course of the antibiotic, increase fluid intake and let know if you continue to have symptoms after the antibiotic is completed. We hope you are feeling better.    [place]  Thank you, [phone], RN
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
## 2020-04-30 ENCOUNTER — Inpatient Hospital Stay (HOSPITAL_COMMUNITY)
Admission: AD | Admit: 2020-04-30 | Discharge: 2020-05-01 | Disposition: A | Discharge status: Home / Self Care | Payer: Medicaid Other | Attending: Obstetrics and Gynecology | Admitting: Obstetrics and Gynecology

## 2020-04-30 ENCOUNTER — Other Ambulatory Visit: Payer: Self-pay

## 2020-04-30 ENCOUNTER — Encounter (HOSPITAL_COMMUNITY): Payer: Self-pay | Admitting: Obstetrics and Gynecology

## 2020-04-30 DIAGNOSIS — O99331 Smoking (tobacco) complicating pregnancy, first trimester: Secondary | ICD-10-CM | POA: Diagnosis not present

## 2020-04-30 DIAGNOSIS — F17210 Nicotine dependence, cigarettes, uncomplicated: Secondary | ICD-10-CM | POA: Diagnosis not present

## 2020-04-30 DIAGNOSIS — O219 Vomiting of pregnancy, unspecified: Secondary | ICD-10-CM | POA: Insufficient documentation

## 2020-04-30 DIAGNOSIS — O2341 Unspecified infection of urinary tract in pregnancy, first trimester: Secondary | ICD-10-CM | POA: Diagnosis not present

## 2020-04-30 DIAGNOSIS — Z3A08 8 weeks gestation of pregnancy: Secondary | ICD-10-CM | POA: Diagnosis not present

## 2020-04-30 DIAGNOSIS — F1290 Cannabis use, unspecified, uncomplicated: Secondary | ICD-10-CM

## 2020-04-30 MED ORDER — LACTATED RINGERS IV BOLUS
1000.0000 mL | Freq: Once | INTRAVENOUS | Status: AC
  Administered 2020-05-01: 1000 mL via INTRAVENOUS

## 2020-04-30 NOTE — MAU Note (Signed)
Pt reports that she has had N/V since yesterday.  Pt states that she is unable to keep anything down.  Pt also states that she desires a TAB.

## 2020-05-01 DIAGNOSIS — O219 Vomiting of pregnancy, unspecified: Secondary | ICD-10-CM | POA: Diagnosis not present

## 2020-05-01 DIAGNOSIS — Z3A08 8 weeks gestation of pregnancy: Secondary | ICD-10-CM | POA: Diagnosis not present

## 2020-05-01 DIAGNOSIS — O2341 Unspecified infection of urinary tract in pregnancy, first trimester: Secondary | ICD-10-CM | POA: Diagnosis not present

## 2020-05-01 LAB — URINALYSIS, ROUTINE W REFLEX MICROSCOPIC
Bilirubin Urine: NEGATIVE
Glucose, UA: NEGATIVE mg/dL
Ketones, ur: 20 mg/dL — AB
Leukocytes,Ua: NEGATIVE
Nitrite: POSITIVE — AB
Protein, ur: >=300 mg/dL — AB
Specific Gravity, Urine: 1.032 — ABNORMAL HIGH (ref 1.005–1.030)
pH: 5 (ref 5.0–8.0)

## 2020-05-01 LAB — BASIC METABOLIC PANEL
Anion gap: 13 (ref 5–15)
BUN: 18 mg/dL (ref 6–20)
CO2: 23 mmol/L (ref 22–32)
Calcium: 9.7 mg/dL (ref 8.9–10.3)
Chloride: 99 mmol/L (ref 98–111)
Creatinine, Ser: 0.72 mg/dL (ref 0.44–1.00)
GFR, Estimated: >60 mL/min (ref >60)
Glucose, Bld: 147 mg/dL — ABNORMAL HIGH (ref 70–99)
Potassium: 3.6 mmol/L (ref 3.5–5.1)
Sodium: 135 mmol/L (ref 135–145)

## 2020-05-01 MED ORDER — CEFADROXIL 500 MG PO CAPS: 500.0000 mg | ORAL_CAPSULE | Freq: Two times a day (BID) | ORAL | 0 refills | Status: DC

## 2020-05-01 MED ORDER — PROMETHAZINE HCL 25 MG/ML IJ SOLN
25.0000 mg | Freq: Once | INTRAMUSCULAR | Status: AC
  Administered 2020-05-01: 25 mg via INTRAVENOUS
  Filled 2020-05-01: qty 1

## 2020-05-01 MED ORDER — M.V.I. ADULT IV INJ
Freq: Once | INTRAVENOUS | Status: AC
  Filled 2020-05-01: qty 10

## 2020-05-01 MED ORDER — METOCLOPRAMIDE HCL 5 MG/ML IJ SOLN
10.0000 mg | Freq: Once | INTRAMUSCULAR | Status: AC
  Administered 2020-05-01: 10 mg via INTRAVENOUS
  Filled 2020-05-01: qty 2

## 2020-05-01 MED ORDER — PROMETHAZINE HCL 12.5 MG PO TABS: 12.5000 mg | ORAL_TABLET | Freq: Four times a day (QID) | ORAL | 0 refills | Status: DC | PRN

## 2020-05-01 NOTE — Discharge Instructions (Signed)
Cannabinoid Hyperemesis Syndrome Cannabinoid hyperemesis syndrome (CHS) is a condition that causes repeated nausea, vomiting, and abdominal pain after long-term (chronic) use of marijuana (cannabis). People with CHS typically use marijuana 3-5 times a day for many years before they have symptoms, although it is possible to develop CHS with as little as 1 use per day. Symptoms of CHS may be mild at first but can get worse and more frequent. In some cases, CHS may cause vomiting many times a day, which can lead to weight loss and dehydration. CHS may go away and come back many times (recur). People may not have symptoms or may otherwise be healthy in between CHS attacks. What are the causes? The exact cause of this condition is not known. Long-term use of marijuana may over-stimulate certain proteins in the brain that react with chemicals in marijuana (cannabinoid receptors). This over-stimulation may cause CHS. What are the signs or symptoms? Symptoms of this condition are often mild during the first few attacks, but they can get worse over time. Symptoms may include:  Frequent nausea, especially early in the morning.  Vomiting.  Abdominal pain. Taking several hot showers throughout the day can also be a sign of this condition. People with CHS may do this because it relieves symptoms. How is this diagnosed? This condition may be diagnosed based on:  Your symptoms and medical history, including any drug use.  A physical exam. You may have tests done to rule out other problems. These tests may include:  Blood tests.  Urine tests.  Imaging tests, such as an X-ray or CT scan. How is this treated? Treatment for this condition involves stopping marijuana use. Your health care provider may recommend:  A drug rehabilitation program, if you have trouble stopping marijuana use.  Medicines for nausea.  Hot showers to help relieve symptoms. Certain creams that contain a substance called  capsaicin may improve symptoms when applied to the abdomen. Ask your health care provider before starting any medicines or other treatments. Severe nausea and vomiting may require you to stay at the hospital. You may need IV fluids to prevent or treat dehydration. You may also need certain medicines that must be given at the hospital. Follow these instructions at home: During an attack   Stay in bed and rest in a dark, quiet room.  Take anti-nausea medicine as told by your health care provider.  Try taking hot showers to relieve your symptoms. After an attack  Drink small amounts of clear fluids slowly. Gradually add more.  Once you are able to eat without vomiting, eat soft foods in small amounts every 3-4 hours. General instructions   Do not use any products that contain marijuana.If you need help quitting, ask your health care provider for resources and treatment options.  Drink enough fluid to keep your urine pale yellow. Avoid drinking fluids that have a lot of sugar or caffeine, such as coffee and soda.  Take and apply over-the-counter and prescription medicines only as told by your health care provider. Ask your health care provider before starting any new medicines or treatments.  Keep all follow-up visits as told by your health care provider. This is important. Contact a health care provider if:  Your symptoms get worse.  You cannot drink fluids without vomiting.  You have pain and trouble swallowing after an attack. Get help right away if:  You cannot stop vomiting.  You have blood in your vomit or your vomit looks like coffee grounds.  You have   severe abdominal pain.  You have stools that are bloody or black, or stools that look like tar.  You have symptoms of dehydration, such as: ? Sunken eyes. ? Inability to make tears. ? Cracked lips. ? Dry mouth. ? Decreased urine production. ? Weakness. ? Sleepiness. ? Fainting. Summary  Cannabinoid hyperemesis  syndrome (CHS) is a condition that causes repeated nausea, vomiting, and abdominal pain after long-term use of marijuana.  People with CHS typically use marijuana 3-5 times a day for many years before they have symptoms, although it is possible to develop CHS with as little as 1 use per day.  Treatment for this condition involves stopping marijuana use. Hot showers and capsaicin creams may also help relieve symptoms. Ask your health care provider before starting any medicines or other treatments.  Your health care provider may prescribe medicines to help with nausea.  Get help right away if you have signs of dehydration, such as dry mouth, decreased urine production, or weakness. This information is not intended to replace advice given to you by your health care provider. Make sure you discuss any questions you have with your health care provider. Document Revised: 11/15/2017 Document Reviewed: 10/17/2016 Elsevier Patient Education  2020 [employer] Inc[employer]  

## 2020-05-01 NOTE — MAU Provider Note (Addendum)
History     CSN: [id number]  Arrival date and time: 04/30/20 2056   First Provider Initiated Contact with Patient 05/01/20 0006      Chief Complaint  Patient presents with   Nausea   Emesis   [name] is a 34 y.o. [id number] at [redacted]w[redacted]d who expresses desire for TAB.  She presents today for Nausea and Emesis. She reports her symptoms started yesterday.  Her husband, Ladene Artist, reports that patient has been unable to keep anything down and throws up "immediately." Patient husband reports that she has thrown up at least 30x today.  He reports she has drank a "2L of sprite, 3 little bottles of sprite, [place] punch and juice." Patient husband reports that patient "unable to get out of the shower" as this is the only way to relieve her symptoms.   Patient endorses usage of MJ and reports last usage was yesterday. Of note, patient laying in bed and does appear mildly distraught.     OB History    Gravida  6   Para  4   Term  4   Preterm      AB  1   Living  4     SAB  1   TAB      Ectopic      Multiple  0   Live Births  4           Past Medical History:  Diagnosis Date   History of reversal of tubal ligation 09/2018    Past Surgical History:  Procedure Laterality Date   TUBAL LIGATION  2008   Tubal reversal  09/2018    Family History  Problem Relation Age of Onset   Healthy Mother    Healthy Father     Social History   Tobacco Use   Smoking status: Current Some Day Smoker    Packs/day: 0.25    Types: Cigarettes   Smokeless tobacco: Never Used   Tobacco comment: Pt states she hasn't smoked in a month  Vaping Use   Vaping Use: Never used  Substance Use Topics   Alcohol use: No   Drug use: Not Currently    Types: Marijuana    Comment: no use x 2wks     Allergies:  Allergies  Allergen Reactions   Pollen Extract Hives    Medications Prior to Admission  Medication Sig Dispense Refill Last Dose   Doxylamine-Pyridoxine (DICLEGIS)  10-10 MG TBEC Take by mouth.   04/30/2020 at Unknown time   cephALEXin (KEFLEX) 500 MG capsule Take 1 capsule (500 mg total) by mouth 4 (four) times daily. 20 capsule 0    Drospirenone (SLYND) 4 MG TABS Take 1 tablet by mouth daily. 84 tablet 4    fluconazole (DIFLUCAN) 150 MG tablet Take 1 tablet (150 mg total) by mouth daily. Repeat in 1 week if needed 2 tablet 0    ibuprofen (ADVIL) 600 MG tablet Take 1 tablet (600 mg total) by mouth every 8 (eight) hours as needed. 45 tablet 0    metroNIDAZOLE (FLAGYL) 500 MG tablet Take 1 tablet (500 mg total) by mouth 2 (two) times daily. 14 tablet 0     Review of Systems  Gastrointestinal: Negative for abdominal pain, nausea and vomiting.  Genitourinary: Negative for difficulty urinating, dysuria, vaginal bleeding and vaginal discharge.  Musculoskeletal: Positive for back pain.  Neurological: Negative for dizziness, light-headedness and headaches.   Physical Exam   Blood pressure 124/67, pulse 84, temperature 98.6 F (  37 C), temperature source Oral, resp. rate 16, weight 51.7 kg, last menstrual period 03/05/2020, SpO2 100 %, not currently breastfeeding.  Physical Exam Constitutional:      General: She is in acute distress.     Appearance: Normal appearance.  HENT:     Head: Normocephalic and atraumatic.  Eyes:     Conjunctiva/sclera: Conjunctivae normal.  Cardiovascular:     Rate and Rhythm: Normal rate.  Pulmonary:     Effort: Pulmonary effort is normal.  Abdominal:     General: Bowel sounds are normal.  Musculoskeletal:     Cervical back: Normal range of motion.  Skin:    General: Skin is warm and dry.  Neurological:     Mental Status: She is alert and oriented to person, place, and time.  Psychiatric:        Mood and Affect: Mood normal.        Behavior: Behavior normal.        Thought Content: Thought content normal.     MAU Course  Procedures Results for orders placed or performed during the hospital encounter of  04/30/20 (from the past 24 hour(s))  Urinalysis, Routine w reflex microscopic Urine, Random     Status: Abnormal   Collection Time: 04/30/20  9:41 PM  Result Value Ref Range   Color, Urine AMBER (A) YELLOW   APPearance CLOUDY (A) CLEAR   Specific Gravity, Urine 1.032 (H) 1.005 - 1.030   pH 5.0 5.0 - 8.0   Glucose, UA NEGATIVE NEGATIVE mg/dL   Hgb urine dipstick SMALL (A) NEGATIVE   Bilirubin Urine NEGATIVE NEGATIVE   Ketones, ur 20 (A) NEGATIVE mg/dL   Protein, ur >=989 (A) NEGATIVE mg/dL   Nitrite POSITIVE (A) NEGATIVE   Leukocytes,Ua NEGATIVE NEGATIVE   RBC / HPF 21-50 0 - 5 RBC/hpf   WBC, UA 0-5 0 - 5 WBC/hpf   Bacteria, UA MANY (A) NONE SEEN   Squamous Epithelial / LPF 11-20 0 - 5   Mucus PRESENT   Basic metabolic panel     Status: Abnormal   Collection Time: 05/01/20  1:13 AM  Result Value Ref Range   Sodium 135 135 - 145 mmol/L   Potassium 3.6 3.5 - 5.1 mmol/L   Chloride 99 98 - 111 mmol/L   CO2 23 22 - 32 mmol/L   Glucose, Bld 147 (H) 70 - 99 mg/dL   BUN 18 6 - 20 mg/dL   Creatinine, Ser 2.11 0.44 - 1.00 mg/dL   Calcium 9.7 8.9 - 94.1 mg/dL   GFR, Estimated >74 >08 mL/min   Anion gap 13 5 - 15   MDM Start IV LR Bolus f/b MVI Antiemetic Labs: UA, BMP Assessment and Plan  34 year old  [id number] at 8.1 weeks Nausea/Vomiting UTI  -POC Reviewed -Discussed starting of IV with fluids. -Labs to be collected. -UA reviewed and significant for UTI. Discussed treatment.  -Will give phenergan  -Will monitor and reassess.   Cherre Robins 05/01/2020, 12:06 AM   Reassessment (3:20 AM)  -MVI with ~316mL infused. -Patient offered and accepts crackers, but reports continued nausea. -Informed that we will hold oral challenge currently and give additional antiemetic. -Will give Reglan 10mg  IV -Continue to monitor.   Reassessment (4:50 AM)  -Patient able to tolerate crackers and soda. -Discussed usage of phenergan for home. -Rx sent. -Discussed Cefadroxil for  UTI. -Encouraged to call or return to MAU if symptoms worsen or with the onset of new symptoms. -Discharged to home  in stable condition.  Cherre Robins MSN, CNM Advanced Practice Provider, [facility]

## 2020-05-03 LAB — URINE CULTURE: Culture: >=100000 — AB

## 2020-05-24 ENCOUNTER — Ambulatory Visit (INDEPENDENT_AMBULATORY_CARE_PROVIDER_SITE_OTHER): Payer: Medicaid Other | Admitting: Obstetrics and Gynecology

## 2020-05-24 ENCOUNTER — Other Ambulatory Visit (HOSPITAL_COMMUNITY)
Admission: RE | Admit: 2020-05-24 | Discharge: 2020-05-24 | Disposition: A | Discharge status: Home / Self Care | Payer: Medicaid Other | Source: Ambulatory Visit | Attending: Obstetrics and Gynecology | Admitting: Obstetrics and Gynecology

## 2020-05-24 ENCOUNTER — Encounter: Payer: Self-pay | Admitting: Obstetrics and Gynecology

## 2020-05-24 ENCOUNTER — Other Ambulatory Visit: Payer: Self-pay

## 2020-05-24 DIAGNOSIS — Z3481 Encounter for supervision of other normal pregnancy, first trimester: Secondary | ICD-10-CM | POA: Diagnosis not present

## 2020-05-24 DIAGNOSIS — Z3480 Encounter for supervision of other normal pregnancy, unspecified trimester: Secondary | ICD-10-CM | POA: Insufficient documentation

## 2020-05-24 DIAGNOSIS — Z3A11 11 weeks gestation of pregnancy: Secondary | ICD-10-CM | POA: Diagnosis not present

## 2020-05-24 DIAGNOSIS — O09891 Supervision of other high risk pregnancies, first trimester: Secondary | ICD-10-CM

## 2020-05-24 MED ORDER — VITAFOL ULTRA 29-0.6-0.4-200 MG PO CAPS: 1.0000 | ORAL_CAPSULE | Freq: Every day | ORAL | 12 refills | Status: DC

## 2020-05-24 NOTE — Progress Notes (Signed)
NOB in office, unplanned pregnancy. Pt denies pain today.

## 2020-05-24 NOTE — Progress Notes (Signed)
INITIAL PRENATAL VISIT NOTE  Subjective:  [name] is a 34 y.o. [id number] at [redacted]w[redacted]d by unsure LMP being seen today for her initial prenatal visit. This is a unplanned pregnancy.  She was using OCP for birth control previously, but had stopped them by the time that she conceived. She has an obstetric history significant for short interval between pregnancies. She has a medical history significant for nothing.  Of note pt did go to a clinic for a medical termination.  Pt stated she received two types of pills likely mifepristone and misoprostol.  Pt states she never bled and the abortion clinic noted live IUP after the procedure.   Patient reports no complaints.  Contractions: Not present. Vag. Bleeding: None.   . Denies leaking of fluid.    Past Medical History:  Diagnosis Date  . History of reversal of tubal ligation 09/2018    Past Surgical History:  Procedure Laterality Date  . TUBAL LIGATION  2008  . Tubal reversal  09/2018    OB History  Gravida Para Term Preterm AB Living  6 4 4   1 4   SAB TAB Ectopic Multiple Live Births  1     0 4    # Outcome Date GA Lbr Len/2nd Weight Sex Delivery Anes PTL Lv  6 Current           5 Term 08/10/19 [redacted]w[redacted]d / 00:31 7 lb 7.9 oz (3.4 kg) M Vag-Spont None  LIV  4 Term 2008     Vag-Spont   LIV  3 SAB 2005 [redacted]w[redacted]d         2 Term 2004     Vag-Spont   LIV  1 Term 2002     Vag-Spont   LIV    Social History   Socioeconomic History  . Marital status: Married    Spouse name: Not on file  . Number of children: Not on file  . Years of education: Not on file  . Highest education level: Not on file  Occupational History  . Not on file  Tobacco Use  . Smoking status: Current Some Day Smoker    Packs/day: 0.25    Types: Cigarettes  . Smokeless tobacco: Never Used  . Tobacco comment: Pt states she hasn't smoked in a month  Vaping Use  . Vaping Use: Never used  Substance and Sexual Activity  . Alcohol use: No  . Drug use: Not Currently      Types: Marijuana    Comment: no use x 4wks   . Sexual activity: Yes  Other Topics Concern  . Not on file  Social History Narrative  . Not on file   Social Determinants of Health   Financial Resource Strain:   . Difficulty of Paying Living Expenses: Not on file  Food Insecurity:   . Worried About 2003 in the Last Year: Not on file  . Ran Out of Food in the Last Year: Not on file  Transportation Needs:   . Lack of Transportation (Medical): Not on file  . Lack of Transportation (Non-Medical): Not on file  Physical Activity:   . Days of Exercise per Week: Not on file  . Minutes of Exercise per Session: Not on file  Stress:   . Feeling of Stress : Not on file  Social Connections:   . Frequency of Communication with Friends and Family: Not on file  . Frequency of Social Gatherings with Friends and Family: Not on file  .  Attends Religious Services: Not on file  . Active Member of Clubs or Organizations: Not on file  . Attends Banker Meetings: Not on file  . Marital Status: Not on file    Family History  Problem Relation Age of Onset  . Healthy Mother   . Healthy Father      Current Outpatient Medications:  .  cefadroxil (DURICEF) 500 MG capsule, Take 1 capsule (500 mg total) by mouth 2 (two) times daily. (Patient not taking: Reported on 05/24/2020), Disp: 10 capsule, Rfl: 0 .  Doxylamine-Pyridoxine (DICLEGIS) 10-10 MG TBEC, Take by mouth. (Patient not taking: Reported on 05/24/2020), Disp: , Rfl:  .  Prenat-Fe Poly-Methfol-FA-DHA (VITAFOL ULTRA) 29-0.6-0.4-200 MG CAPS, Take 1 tablet by mouth daily., Disp: 30 capsule, Rfl: 12 .  promethazine (PHENERGAN) 12.5 MG tablet, Take 1 tablet (12.5 mg total) by mouth every 6 (six) hours as needed for nausea or vomiting. (Patient not taking: Reported on 05/24/2020), Disp: 30 tablet, Rfl: 0  Allergies  Allergen Reactions  . Pollen Extract Hives    Review of Systems: Negative except for what is mentioned in  HPI.  Objective:   Vitals:   05/24/20 0835  BP: 103/67  Pulse: 82  Weight: 128 lb 8 oz (58.3 kg)    Fetal Status: Fetal Heart Rate (bpm): 160         Physical Exam: BP 103/67   Pulse 82   Wt 128 lb 8 oz (58.3 kg)   LMP 03/05/2020 (Approximate)   BMI 22.06 kg/m  CONSTITUTIONAL: Well-developed, well-nourished female in no acute distress.  NEUROLOGIC: Alert and oriented to person, place, and time. Normal reflexes, muscle tone coordination. No cranial nerve deficit noted. PSYCHIATRIC: Normal mood and affect. Normal behavior. Normal judgment and thought content. SKIN: Skin is warm and dry. No rash noted. Not diaphoretic. No erythema. No pallor. HENT:  Normocephalic, atraumatic, External right and left ear normal. Oropharynx is clear and moist EYES: Conjunctivae and EOM are normal. .  NECK: Normal range of motion, supple, no masses CARDIOVASCULAR: Normal heart rate noted, regular rhythm RESPIRATORY: Effort and breath sounds normal, no problems with respiration noted BREASTS: deferred ABDOMEN: Soft, nontender, nondistended, gravid. GU: normal appearing external female genitalia, multiparous normal appearing cervix, scant white discharge in vagina, no lesions noted Bimanual: 11 weeks sized uterus, no adnexal tenderness or palpable lesions noted MUSCULOSKELETAL: Normal range of motion. EXT:  No edema and no tenderness. 2+ distal pulses.   Assessment and Plan:  Pregnancy: [id number] at [redacted]w[redacted]d by unsure LMP  1. Supervision of other normal pregnancy, antepartum Spoke with Dr. Judeth Cornfield, MD, MFM about the failed termination and early exposure to prostaglandins.  He did not feel any overwhelming risk of malformation, but he did agree to 12-13 week u/s for dating and early anatomy.  Pt advised to discontinue THC use. - Culture, OB Urine - Genetic Screening - Cervicovaginal ancillary only( [facility]) - CBC/D/Plt+RPR+Rh+ABO+Rub Ab... - Babyscripts Schedule Optimization - Prenat-Fe  Poly-Methfol-FA-DHA (VITAFOL ULTRA) 29-0.6-0.4-200 MG CAPS; Take 1 tablet by mouth daily.  Dispense: 30 capsule; Refill: 12 - US OB Comp Less 14 Wks; Future  2. Short interval between pregnancies affecting pregnancy in first trimester, antepartum   3. [redacted] weeks gestation of pregnancy Dating may be changed pending u/s   Preterm labor symptoms and general obstetric precautions including but not limited to vaginal bleeding, contractions, leaking of fluid and fetal movement were reviewed in detail with the patient.  Please refer to After Visit Summary for other counseling  recommendations.   Return in about 4 weeks (around 06/21/2020) for ROB, in person.  Warden Fillers 05/24/2020 11:37 AM

## 2020-05-24 NOTE — Patient Instructions (Signed)
Marijuana Use During Pregnancy and Breastfeeding  Marijuana is the dried leaves, flowers, and stems of the Cannabis sativa or Cannabis indica plant. The plant's active ingredients (cannabinoids), including a chemical called THC, change the chemistry of the brain. Marijuana smoke also has many of the same chemicals as cigarette smoke that cause breathing problems. Marijuana gets into your blood through your lungs when you smoke it and through your digestive system when you swallow it. Using marijuana in any form may be harmful for you and your baby when you are trying to become pregnant and during pregnancy. This includes marijuana that is prescribed to you by a health care provider (medical marijuana). Once marijuana is in your blood, it can travel through your placenta to your baby. It may also pass through breast milk. How does this affect me? Marijuana affects you both mentally and physically. Using marijuana can make you feel high and relaxed. It can also have negative effects, especially at high doses or with long-term use. These include:  Rapid heartbeat and stress on your heart.  Lung irritation and breathing problems.  Difficulty thinking and making decisions.  Seeing or believing things that are not true (hallucinations and paranoia).  Mood swings, depression, or anxiety.  Decreased ability to learn and remember.  Difficulty getting pregnant. Marijuana can also affect your pregnancy. Not all the effects are known. However, if you use marijuana during pregnancy, you may:  Be less likely to get regular prenatal care and do the things that you need to do to have a healthy pregnancy.  Be more likely to use other drugs that can harm your pregnancy, like drinking alcohol and smoking cigarettes.  Be at higher risk of having your baby die after 28 weeks of pregnancy (stillbirth).  Be at higher risk of giving birth before 37 weeks of pregnancy (premature birth). How does this affect my  baby? If you use marijuana during pregnancy, this may affect your baby's development, birth, and life after birth. Your baby may:  Be born prematurely, which can cause physical and mental problems.  Be born with a low birth weight, which can lead to physical and mental problems.  Have problems with brain development.  Have difficulty growing.  Have attention and behavior problems later in life.  Do poorly at school and have learning problems later in life.  Have problems with vision and coordination.  Be at higher risk for using marijuana by age 38. More research is needed to find out exactly how marijuana affects a baby during breastfeeding. Some studies suggest that the chemicals in marijuana can be passed to a baby through breast milk. To limit possible risks, you should not use marijuana during breastfeeding. Follow these instructions at home:  Let your health care provider know if you use marijuana before trying to get pregnant, during pregnancy, or during breastfeeding.  Do not use marijuana in any form when you are trying to get pregnant, when you are pregnant, or when you are breastfeeding. If you are having trouble stopping marijuana use, ask your health care provider for help.  Do not smoke. If you need help quitting, ask your health care provider for help.  If you are using medical marijuana, ask your health care provider to switch you to a medicine that is safer to use during pregnancy or breastfeeding.  Keep all your prenatal visits as told by your health care provider. This is important. Where to find more information [employer] on Drug Abuse: www.drugabuse.gov March of Dimes:  www.marchofdimes.org/pregnancy Contact a health care provider if:  You use marijuana and want to get pregnant.  You use marijuana during pregnancy or breastfeeding.  You need help stopping marijuana use. Get help right away if:  Your baby is not gaining weight or growing as  expected. Summary  Using marijuana in any form may be harmful for you and your baby when you are trying to become pregnant, during pregnancy, and during breastfeeding. This includes marijuana that is prescribed to you (medical marijuana).  Some studies suggest that marijuana may pass through breast milk and can affect your baby's brain development.  Talk to your health care provider if you use marijuana in any form while trying to get pregnant, during pregnancy, or while breastfeeding.  Ask your health care provider for help if you are not able to stop using marijuana. This information is not intended to replace advice given to you by your health care provider. Make sure you discuss any questions you have with your health care provider. Document Revised: 10/31/2018 Document Reviewed: 03/27/2017 Elsevier Patient Education  2020 [employer]. First Trimester of Pregnancy  The first trimester of pregnancy is from week 1 until the end of week 13 (months 1 through 3). During this time, your baby will begin to develop inside you. At 6-8 weeks, the eyes and face are formed, and the heartbeat can be seen on ultrasound. At the end of 12 weeks, all the baby's organs are formed. Prenatal care is all the medical care you receive before the birth of your baby. Make sure you get good prenatal care and follow all of your doctor's instructions. Follow these instructions at home: Medicines  Take over-the-counter and prescription medicines only as told by your doctor. Some medicines are safe and some medicines are not safe during pregnancy.  Take a prenatal vitamin that contains at least 600 micrograms (mcg) of folic acid.  If you have trouble pooping (constipation), take medicine that will make your stool soft (stool softener) if your doctor approves. Eating and drinking   Eat regular, healthy meals.  Your doctor will tell you the amount of weight gain that is right for you.  Avoid raw meat and  uncooked cheese.  If you feel sick to your stomach (nauseous) or throw up (vomit): ? Eat 4 or 5 small meals a day instead of 3 large meals. ? Try eating a few soda crackers. ? Drink liquids between meals instead of during meals.  To prevent constipation: ? Eat foods that are high in fiber, like fresh fruits and vegetables, whole grains, and beans. ? Drink enough fluids to keep your pee (urine) clear or pale yellow. Activity  Exercise only as told by your doctor. Stop exercising if you have cramps or pain in your lower belly (abdomen) or low back.  Do not exercise if it is too hot, too humid, or if you are in a place of great height (high altitude).  Try to avoid standing for long periods of time. Move your legs often if you must stand in one place for a long time.  Avoid heavy lifting.  Wear low-heeled shoes. Sit and stand up straight.  You can have sex unless your doctor tells you not to. Relieving pain and discomfort  Wear a good support bra if your breasts are sore.  Take warm water baths (sitz baths) to soothe pain or discomfort caused by hemorrhoids. Use hemorrhoid cream if your doctor says it is okay.  Rest with your legs raised if  you have leg cramps or low back pain.  If you have puffy, bulging veins (varicose veins) in your legs: ? Wear support hose or compression stockings as told by your doctor. ? Raise (elevate) your feet for 15 minutes, 3-4 times a day. ? Limit salt in your food. Prenatal care  Schedule your prenatal visits by the twelfth week of pregnancy.  Write down your questions. Take them to your prenatal visits.  Keep all your prenatal visits as told by your doctor. This is important. Safety  Wear your seat belt at all times when driving.  Make a list of emergency phone numbers. The list should include numbers for family, friends, the hospital, and police and fire departments. General instructions  Ask your doctor for a referral to a local prenatal  class. Begin classes no later than at the start of month 6 of your pregnancy.  Ask for help if you need counseling or if you need help with nutrition. Your doctor can give you advice or tell you where to go for help.  Do not use hot tubs, steam rooms, or saunas.  Do not douche or use tampons or scented sanitary pads.  Do not cross your legs for long periods of time.  Avoid all herbs and alcohol. Avoid drugs that are not approved by your doctor.  Do not use any tobacco products, including cigarettes, chewing tobacco, and electronic cigarettes. If you need help quitting, ask your doctor. You may get counseling or other support to help you quit.  Avoid cat litter boxes and soil used by cats. These carry germs that can cause birth defects in the baby and can cause a loss of your baby (miscarriage) or stillbirth.  Visit your dentist. At home, brush your teeth with a soft toothbrush. Be gentle when you floss. Contact a doctor if:  You are dizzy.  You have mild cramps or pressure in your lower belly.  You have a nagging pain in your belly area.  You continue to feel sick to your stomach, you throw up, or you have watery poop (diarrhea).  You have a bad smelling fluid coming from your vagina.  You have pain when you pee (urinate).  You have increased puffiness (swelling) in your face, hands, legs, or ankles. Get help right away if:  You have a fever.  You are leaking fluid from your vagina.  You have spotting or bleeding from your vagina.  You have very bad belly cramping or pain.  You gain or lose weight rapidly.  You throw up blood. It may look like coffee grounds.  You are around people who have [place] measles, fifth disease, or chickenpox.  You have a very bad headache.  You have shortness of breath.  You have any kind of trauma, such as from a fall or a car accident. Summary  The first trimester of pregnancy is from week 1 until the end of week 13 (months 1 through  3).  To take care of yourself and your unborn baby, you will need to eat healthy meals, take medicines only if your doctor tells you to do so, and do activities that are safe for you and your baby.  Keep all follow-up visits as told by your doctor. This is important as your doctor will have to ensure that your baby is healthy and growing well. This information is not intended to replace advice given to you by your health care provider. Make sure you discuss any questions you have with your  health care provider. Document Revised: 10/30/2018 Document Reviewed: 07/17/2016 Elsevier Patient Education  2020 [employer].

## 2020-05-25 LAB — CBC/D/PLT+RPR+RH+ABO+RUB AB...
Antibody Screen: NEGATIVE
Basophils Absolute: 0 10*3/uL (ref 0.0–0.2)
Basos: 1 %
EOS (ABSOLUTE): 0.1 10*3/uL (ref 0.0–0.4)
Eos: 1 %
HCV Ab: <0.1 s/co ratio (ref 0.0–0.9)
HIV Screen 4th Generation wRfx: NONREACTIVE
Hematocrit: 34.8 % (ref 34.0–46.6)
Hemoglobin: 11.3 g/dL (ref 11.1–15.9)
Hepatitis B Surface Ag: NEGATIVE
Immature Grans (Abs): 0 10*3/uL (ref 0.0–0.1)
Immature Granulocytes: 0 %
Lymphocytes Absolute: 1.4 10*3/uL (ref 0.7–3.1)
Lymphs: 18 %
MCH: 28.2 pg (ref 26.6–33.0)
MCHC: 32.5 g/dL (ref 31.5–35.7)
MCV: 87 fL (ref 79–97)
Monocytes Absolute: 0.5 10*3/uL (ref 0.1–0.9)
Monocytes: 6 %
Neutrophils Absolute: 5.9 10*3/uL (ref 1.4–7.0)
Neutrophils: 74 %
Platelets: 332 10*3/uL (ref 150–450)
RBC: 4.01 x10E6/uL (ref 3.77–5.28)
RDW: 13.4 % (ref 11.7–15.4)
RPR Ser Ql: NONREACTIVE
Rh Factor: POSITIVE
Rubella Antibodies, IGG: 5.1 index (ref >0.99)
WBC: 7.9 10*3/uL (ref 3.4–10.8)

## 2020-05-25 LAB — CERVICOVAGINAL ANCILLARY ONLY
Bacterial Vaginitis (gardnerella): POSITIVE — AB
Candida Glabrata: NEGATIVE
Candida Vaginitis: NEGATIVE
Chlamydia: NEGATIVE
Comment: NEGATIVE
Comment: NEGATIVE
Comment: NEGATIVE
Comment: NEGATIVE
Comment: NEGATIVE
Comment: NORMAL
Neisseria Gonorrhea: NEGATIVE
Trichomonas: NEGATIVE

## 2020-05-25 LAB — HCV INTERPRETATION

## 2020-05-26 ENCOUNTER — Other Ambulatory Visit: Payer: Self-pay

## 2020-05-26 DIAGNOSIS — B9689 Other specified bacterial agents as the cause of diseases classified elsewhere: Secondary | ICD-10-CM

## 2020-05-26 MED ORDER — METRONIDAZOLE 500 MG PO TABS: 500.0000 mg | ORAL_TABLET | Freq: Two times a day (BID) | ORAL | 0 refills | Status: DC

## 2020-05-28 LAB — URINE CULTURE, OB REFLEX

## 2020-05-28 LAB — CULTURE, OB URINE

## 2020-05-30 ENCOUNTER — Encounter: Payer: Self-pay | Admitting: Obstetrics and Gynecology

## 2020-05-30 ENCOUNTER — Telehealth: Payer: Self-pay | Admitting: Licensed Clinical Social Worker

## 2020-05-30 NOTE — Telephone Encounter (Signed)
Pt called in reports having trouble with [employer] website. [name] requesting to know the baby's gender.

## 2020-06-02 ENCOUNTER — Other Ambulatory Visit: Payer: Self-pay | Admitting: Obstetrics and Gynecology

## 2020-06-02 ENCOUNTER — Other Ambulatory Visit: Payer: Self-pay

## 2020-06-02 ENCOUNTER — Inpatient Hospital Stay: Admission: RE | Admit: 2020-06-02 | Payer: Medicaid Other | Source: Ambulatory Visit

## 2020-06-02 ENCOUNTER — Ambulatory Visit: Payer: Medicaid Other | Attending: Obstetrics and Gynecology

## 2020-06-02 ENCOUNTER — Ambulatory Visit: Payer: Medicaid Other | Admitting: *Deleted

## 2020-06-02 VITALS — BP 137/89 | HR 95

## 2020-06-02 DIAGNOSIS — Z3480 Encounter for supervision of other normal pregnancy, unspecified trimester: Secondary | ICD-10-CM | POA: Diagnosis not present

## 2020-06-02 DIAGNOSIS — O2341 Unspecified infection of urinary tract in pregnancy, first trimester: Secondary | ICD-10-CM

## 2020-06-02 DIAGNOSIS — Z3491 Encounter for supervision of normal pregnancy, unspecified, first trimester: Secondary | ICD-10-CM

## 2020-06-02 MED ORDER — CEPHALEXIN 500 MG PO CAPS: 500.0000 mg | ORAL_CAPSULE | Freq: Two times a day (BID) | ORAL | 0 refills | Status: DC

## 2020-06-02 MED ORDER — CEPHALEXIN 500 MG PO CAPS: 500.0000 mg | ORAL_CAPSULE | Freq: Four times a day (QID) | ORAL | 0 refills | Status: DC

## 2020-06-02 NOTE — Progress Notes (Signed)
Pt states " I am not taking any medications"

## 2020-06-03 ENCOUNTER — Telehealth: Payer: Self-pay

## 2020-06-03 NOTE — Telephone Encounter (Signed)
-----   Message from Warden Fillers, MD sent at 05/29/2020 10:45 PM EST ----- Uti noted, pt to start antibiotic, keflex 250 mg po 4 times a day x 7 days

## 2020-06-03 NOTE — Telephone Encounter (Signed)
Call patient and advised her of diagnosis. She will go and pick up medication today.

## 2020-06-21 ENCOUNTER — Other Ambulatory Visit: Payer: Self-pay

## 2020-06-21 ENCOUNTER — Encounter: Payer: Self-pay | Admitting: Obstetrics and Gynecology

## 2020-06-21 ENCOUNTER — Ambulatory Visit (INDEPENDENT_AMBULATORY_CARE_PROVIDER_SITE_OTHER): Payer: Medicaid Other | Admitting: Obstetrics and Gynecology

## 2020-06-21 DIAGNOSIS — Z3480 Encounter for supervision of other normal pregnancy, unspecified trimester: Secondary | ICD-10-CM

## 2020-06-21 NOTE — Progress Notes (Signed)
ROB. AFP today.  Pt has not started UTI Rx.   CC: None

## 2020-06-21 NOTE — Patient Instructions (Signed)
Alpha-Fetoprotein Test Why am I having this test? The alpha-fetoprotein test is most commonly used in pregnant women to help screen for birth defects in their unborn baby. It can be used to screen for birth defects, such as chromosome (DNA) abnormalities, problems with the brain or spinal cord, or problems with the abdominal wall of the unborn baby (fetus). The alpha-fetoprotein test may also be done for men or non-pregnant women to check for certain cancers. What is being tested? This test measures the amount of alpha-fetoprotein (AFP) in your blood. AFP is a protein that is made by the liver. Levels can be detected in the mother's blood during pregnancy, starting at 10 weeks and peaking at 16-18 weeks of the pregnancy. Abnormal levels can sometimes be a sign of a birth defect in the baby. Certain cancers can cause a high level of AFP in men and non-pregnant women. What kind of sample is taken?  A blood sample is required for this test. It is usually collected by inserting a needle into a blood vessel. How are the results reported? Your test results will be reported as values. Your health care provider will compare your results to normal ranges that were established after testing a large group of people (reference values). Reference values may vary among labs and hospitals. For this test, common reference values are:  Adult: Less than 40 ng/mL or less than 40 mcg/L (SI units).  Child younger than 1 year: Less than 30 ng/mL. If you are pregnant, the values may also vary based on how long you have been pregnant. What do the results mean? Results that are above the reference values in pregnant women may indicate the following for the baby:  Neural tube defects, such as abnormalities of the spinal cord or brain.  Abdominal wall defects.  Multiple pregnancy such as twins.  Fetal distress or fetal death. Results that are above the reference values in men or non-pregnant women may  indicate:  Reproductive cancers, such as ovarian or testicular cancer.  Liver cancer.  Liver cell death.  Other types of cancer. Very low levels of AFP in pregnant women may indicate the following for the baby:  Down syndrome.  Fetal death. Talk with your health care provider about what your results mean. Questions to ask your health care provider Ask your health care provider, or the department that is doing the test:  When will my results be ready?  How will I get my results?  What are my treatment options?  What other tests do I need?  What are my next steps? Summary  The alpha-fetoprotein test is done on pregnant women to help screen for birth defects in their unborn baby.  Certain cancers can cause a high level of AFP in men and non-pregnant women.  For this test, a blood sample is usually collected by inserting a needle into a blood vessel.  Talk with your health care provider about what your results mean. This information is not intended to replace advice given to you by your health care provider. Make sure you discuss any questions you have with your health care provider. Document Revised: 06/21/2017 Document Reviewed: 02/12/2017 Elsevier Patient Education  2020 Else[employer] Inc[employer]  

## 2020-06-21 NOTE — Progress Notes (Signed)
   PRENATAL VISIT NOTE  Subjective:  [name] is a 34 y.o. [id number] at [redacted]w[redacted]d being seen today for ongoing prenatal care.  She is currently monitored for the following issues for this low-risk pregnancy and has Dental abscess; Supervision of normal pregnancy, antepartum; Anemia in pregnancy; Cervical high risk HPV (human papillomavirus) test positive; Alpha thalassemia silent carrier; UTI (urinary tract infection) during pregnancy; Pregnancy; SVD (spontaneous vaginal delivery); Strain of extensor muscle, fascia and tendon of left thumb at wrist and hand level, initial encounter; Supervision of other normal pregnancy, antepartum; Short interval between pregnancies affecting pregnancy in first trimester, antepartum; and [redacted] weeks gestation of pregnancy on their problem list.  Patient reports no complaints.  Contractions: Not present. Vag. Bleeding: None.  Movement: Present. Denies leaking of fluid.   The following portions of the patient's history were reviewed and updated as appropriate: allergies, current medications, past family history, past medical history, past social history, past surgical history and problem list.   Objective:   Vitals:   06/21/20 1021  BP: 103/66  Pulse: 76  Weight: 130 lb (59 kg)    Fetal Status: Fetal Heart Rate (bpm): 155   Movement: Present     General:  Alert, oriented and cooperative. Patient is in no acute distress.  Skin: Skin is warm and dry. No rash noted.   Cardiovascular: Normal heart rate noted  Respiratory: Normal respiratory effort, no problems with respiration noted  Abdomen: Soft, gravid, appropriate for gestational age.  Pain/Pressure: Absent     Pelvic: Cervical exam deferred        Extremities: Normal range of motion.  Edema: None  Mental Status: Normal mood and affect. Normal behavior. Normal judgment and thought content.   Assessment and Plan:  Pregnancy: [id number] at [redacted]w[redacted]d 1. Supervision of other normal pregnancy, antepartum - Genetic  screening discussed.  Patient declines AFP testing. - Patient admits to not taking the antibiotics for the UTI.  She states that she will pick up the prescription today.   - Patient already scheduled for her anatomy screen at 20 weeks.    Preterm labor symptoms and general obstetric precautions including but not limited to vaginal bleeding, contractions, leaking of fluid and fetal movement were reviewed in detail with the patient. Please refer to After Visit Summary for other counseling recommendations.   Return in about 4 weeks (around 07/19/2020).  No future appointments.  Johnny Bridge, MD

## 2020-07-19 ENCOUNTER — Encounter: Payer: Self-pay | Admitting: Obstetrics and Gynecology

## 2020-07-19 ENCOUNTER — Other Ambulatory Visit: Payer: Self-pay

## 2020-07-19 ENCOUNTER — Ambulatory Visit (INDEPENDENT_AMBULATORY_CARE_PROVIDER_SITE_OTHER): Payer: Medicaid Other | Admitting: Obstetrics and Gynecology

## 2020-07-19 VITALS — BP 97/59 | HR 84 | Temp 98.8°F | Wt 133.4 lb

## 2020-07-19 DIAGNOSIS — Z3480 Encounter for supervision of other normal pregnancy, unspecified trimester: Secondary | ICD-10-CM

## 2020-07-19 DIAGNOSIS — Z3A19 19 weeks gestation of pregnancy: Secondary | ICD-10-CM | POA: Insufficient documentation

## 2020-07-19 DIAGNOSIS — D563 Thalassemia minor: Secondary | ICD-10-CM

## 2020-07-19 NOTE — Progress Notes (Signed)
Pt presents for ROB Pt reports no complaints today.

## 2020-07-19 NOTE — Patient Instructions (Addendum)
Second Trimester of Pregnancy  The second trimester is from week 14 through week 27 (month 4 through 6). This is often the time in pregnancy that you feel your best. Often times, morning sickness has lessened or quit. You may have more energy, and you may get hungry more often. Your unborn baby is growing rapidly. At the end of the sixth month, he or she is about 9 inches long and weighs about 1 pounds. You will likely feel the baby move between 18 and 20 weeks of pregnancy. Follow these instructions at home: Medicines  Take over-the-counter and prescription medicines only as told by your doctor. Some medicines are safe and some medicines are not safe during pregnancy.  Take a prenatal vitamin that contains at least 600 micrograms (mcg) of folic acid.  If you have trouble pooping (constipation), take medicine that will make your stool soft (stool softener) if your doctor approves. Eating and drinking   Eat regular, healthy meals.  Avoid raw meat and uncooked cheese.  If you get low calcium from the food you eat, talk to your doctor about taking a daily calcium supplement.  Avoid foods that are high in fat and sugars, such as fried and sweet foods.  If you feel sick to your stomach (nauseous) or throw up (vomit): ? Eat 4 or 5 small meals a day instead of 3 large meals. ? Try eating a few soda crackers. ? Drink liquids between meals instead of during meals.  To prevent constipation: ? Eat foods that are high in fiber, like fresh fruits and vegetables, whole grains, and beans. ? Drink enough fluids to keep your pee (urine) clear or pale yellow. Activity  Exercise only as told by your doctor. Stop exercising if you start to have cramps.  Do not exercise if it is too hot, too humid, or if you are in a place of great height (high altitude).  Avoid heavy lifting.  Wear low-heeled shoes. Sit and stand up straight.  You can continue to have sex unless your doctor tells you not  to. Relieving pain and discomfort  Wear a good support bra if your breasts are tender.  Take warm water baths (sitz baths) to soothe pain or discomfort caused by hemorrhoids. Use hemorrhoid cream if your doctor approves.  Rest with your legs raised if you have leg cramps or low back pain.  If you develop puffy, bulging veins (varicose veins) in your legs: ? Wear support hose or compression stockings as told by your doctor. ? Raise (elevate) your feet for 15 minutes, 3-4 times a day. ? Limit salt in your food. Prenatal care  Write down your questions. Take them to your prenatal visits.  Keep all your prenatal visits as told by your doctor. This is important. Safety  Wear your seat belt when driving.  Make a list of emergency phone numbers, including numbers for family, friends, the hospital, and police and fire departments. General instructions  Ask your doctor about the right foods to eat or for help finding a counselor, if you need these services.  Ask your doctor about local prenatal classes. Begin classes before month 6 of your pregnancy.  Do not use hot tubs, steam rooms, or saunas.  Do not douche or use tampons or scented sanitary pads.  Do not cross your legs for long periods of time.  Visit your dentist if you have not done so. Use a soft toothbrush to brush your teeth. Floss gently.  Avoid all smoking, herbs,   and alcohol. Avoid drugs that are not approved by your doctor.  Do not use any products that contain nicotine or tobacco, such as cigarettes and e-cigarettes. If you need help quitting, ask your doctor.  Avoid cat litter boxes and soil used by cats. These carry germs that can cause birth defects in the baby and can cause a loss of your baby (miscarriage) or stillbirth. Contact a doctor if:  You have mild cramps or pressure in your lower belly.  You have pain when you pee (urinate).  You have bad smelling fluid coming from your vagina.  You continue to  feel sick to your stomach (nauseous), throw up (vomit), or have watery poop (diarrhea).  You have a nagging pain in your belly area.  You feel dizzy. Get help right away if:  You have a fever.  You are leaking fluid from your vagina.  You have spotting or bleeding from your vagina.  You have severe belly cramping or pain.  You lose or gain weight rapidly.  You have trouble catching your breath and have chest pain.  You notice sudden or extreme puffiness (swelling) of your face, hands, ankles, feet, or legs.  You have not felt the baby move in over an hour.  You have severe headaches that do not go away when you take medicine.  You have trouble seeing. Summary  The second trimester is from week 14 through week 27 (months 4 through 6). This is often the time in pregnancy that you feel your best.  To take care of yourself and your unborn baby, you will need to eat healthy meals, take medicines only if your doctor tells you to do so, and do activities that are safe for you and your baby.  Call your doctor if you get sick or if you notice anything unusual about your pregnancy. Also, call your doctor if you need help with the right food to eat, or if you want to know what activities are safe for you. This information is not intended to replace advice given to you by your health care provider. Make sure you discuss any questions you have with your health care provider. Document Revised: 10/31/2018 Document Reviewed: 08/14/2016 Elsevier Patient Education  2020 [employer].  Alpha-Fetoprotein Test Why am I having this test? The alpha-fetoprotein test is most commonly used in pregnant women to help screen for birth defects in their unborn baby. It can be used to screen for birth defects, such as chromosome (DNA) abnormalities, problems with the brain or spinal cord, or problems with the abdominal wall of the unborn baby (fetus). The alpha-fetoprotein test may also be done for men or  non-pregnant women to check for certain cancers. What is being tested? This test measures the amount of alpha-fetoprotein (AFP) in your blood. AFP is a protein that is made by the liver. Levels can be detected in the mother's blood during pregnancy, starting at 10 weeks and peaking at 16-18 weeks of the pregnancy. Abnormal levels can sometimes be a sign of a birth defect in the baby. Certain cancers can cause a high level of AFP in men and non-pregnant women. What kind of sample is taken?  A blood sample is required for this test. It is usually collected by inserting a needle into a blood vessel. How are the results reported? Your test results will be reported as values. Your health care provider will compare your results to normal ranges that were established after testing a large group of people (  reference values). Reference values may vary among labs and hospitals. For this test, common reference values are:  Adult: Less than 40 ng/mL or less than 40 mcg/L (SI units).  Child younger than 1 year: Less than 30 ng/mL. If you are pregnant, the values may also vary based on how long you have been pregnant. What do the results mean? Results that are above the reference values in pregnant women may indicate the following for the baby:  Neural tube defects, such as abnormalities of the spinal cord or brain.  Abdominal wall defects.  Multiple pregnancy such as twins.  Fetal distress or fetal death. Results that are above the reference values in men or non-pregnant women may indicate:  Reproductive cancers, such as ovarian or testicular cancer.  Liver cancer.  Liver cell death.  Other types of cancer. Very low levels of AFP in pregnant women may indicate the following for the baby:  Down syndrome.  Fetal death. Talk with your health care provider about what your results mean. Questions to ask your health care provider Ask your health care provider, or the department that is doing the  test:  When will my results be ready?  How will I get my results?  What are my treatment options?  What other tests do I need?  What are my next steps? Summary  The alpha-fetoprotein test is done on pregnant women to help screen for birth defects in their unborn baby.  Certain cancers can cause a high level of AFP in men and non-pregnant women.  For this test, a blood sample is usually collected by inserting a needle into a blood vessel.  Talk with your health care provider about what your results mean. This information is not intended to replace advice given to you by your health care provider. Make sure you discuss any questions you have with your health care provider. Document Revised: 06/21/2017 Document Reviewed: 02/12/2017 Elsevier Patient Education  2020 [employer].

## 2020-07-19 NOTE — Progress Notes (Signed)
° °  PRENATAL VISIT NOTE  Subjective:  [name] is a 34 y.o. [id number] at [redacted]w[redacted]d being seen today for ongoing prenatal care.  She is currently monitored for the following issues for this high-risk pregnancy and has Dental abscess; Supervision of normal pregnancy, antepartum; Anemia in pregnancy; Cervical high risk HPV (human papillomavirus) test positive; Alpha thalassemia silent carrier; UTI (urinary tract infection) during pregnancy; Pregnancy; SVD (spontaneous vaginal delivery); Strain of extensor muscle, fascia and tendon of left thumb at wrist and hand level, initial encounter; Supervision of other normal pregnancy, antepartum; Short interval between pregnancies affecting pregnancy in first trimester, antepartum; [redacted] weeks gestation of pregnancy; and [redacted] weeks gestation of pregnancy on their problem list.  Patient doing well with no acute concerns today. She reports no complaints.  Contractions: Not present. Vag. Bleeding: None.  Movement: Present. Denies leaking of fluid.   The following portions of the patient's history were reviewed and updated as appropriate: allergies, current medications, past family history, past medical history, past social history, past surgical history and problem list. Problem list updated.  Objective:   Vitals:   07/19/20 1033  BP: (!) 97/59  Pulse: 84  Temp: 98.8 F (37.1 C)  Weight: 133 lb 6.4 oz (60.5 kg)    Fetal Status: Fetal Heart Rate (bpm): 146   Movement: Present     General:  Alert, oriented and cooperative. Patient is in no acute distress.  Skin: Skin is warm and dry. No rash noted.   Cardiovascular: Normal heart rate noted  Respiratory: Normal respiratory effort, no problems with respiration noted  Abdomen: Soft, gravid, appropriate for gestational age.  Pain/Pressure: Absent     Pelvic: Cervical exam deferred        Extremities: Normal range of motion.  Edema: None  Mental Status:  Normal mood and affect. Normal behavior. Normal judgment  and thought content.   Assessment and Plan:  Pregnancy: [id number] at [redacted]w[redacted]d  1. Supervision of other normal pregnancy, antepartum Routine care - [place] MFM OB COMP + 14 WK; Future - AFP, Serum, Open Spina Bifida  2. Alpha thalassemia silent carrier   3. [redacted] weeks gestation of pregnancy   Preterm labor symptoms and general obstetric precautions including but not limited to vaginal bleeding, contractions, leaking of fluid and fetal movement were reviewed in detail with the patient.  Please refer to After Visit Summary for other counseling recommendations.   Return in about 4 weeks (around 08/16/2020) for ROB, in person.   Mariel Aloe, MD

## 2020-07-21 LAB — AFP, SERUM, OPEN SPINA BIFIDA
AFP MoM: 1.03
AFP Value: 62.2 ng/mL
Gest. Age on Collection Date: 19.1 weeks
Maternal Age At EDD: 35.5 yr
OSBR Risk 1 IN: 10000
Test Results:: NEGATIVE
Weight: 133 [lb_av]

## 2020-07-23 NOTE — L&D Delivery Note (Signed)
LABOR COURSE Patient was admitted for rupture of membranes 12/01/2020. She was augmented with Pitocin this morning. Pitocin was briefly discontinued then restarted at 1310.   Delivery Note Called to room and patient was complete and pushing. Head delivered LOT. No nuchal cord present. Shoulder and body delivered in usual fashion. At 1659 a viable female was delivered via Vaginal, Spontaneous (Presentation: LOT ; LOA).  Infant with spontaneous cry, placed on mother's abdomen, dried and stimulated. Cord clamped x 2 after one-minute delay, and cut by patient's mother. Newborn briefly relocated to warmer for improved assessment of oxygenation status. Cord blood drawn. Placenta delivered spontaneously with gentle cord traction. Appears intact. Fundus firm with massage and Pitocin. Labia, perineum, vagina, and cervix inspected.   APGAR:9, 9 ; weight: 3039 g  .   Cord: 3VC with the following complications: None.    Anesthesia:  Epidural Episiotomy: None Lacerations: None Est. Blood Loss (mL): 100  Mom to postpartum.  Baby to Couplet care / Skin to Skin.  Clayton Bibles, CNM 12/02/20 8:00 PM

## 2020-08-11 ENCOUNTER — Ambulatory Visit: Payer: Medicaid Other

## 2020-08-16 ENCOUNTER — Encounter: Payer: Medicaid Other | Admitting: Obstetrics & Gynecology

## 2020-08-17 ENCOUNTER — Encounter: Payer: Medicaid Other | Admitting: Obstetrics

## 2020-08-18 ENCOUNTER — Other Ambulatory Visit: Payer: Self-pay

## 2020-08-18 ENCOUNTER — Encounter: Payer: Medicaid Other | Admitting: Obstetrics

## 2020-08-18 ENCOUNTER — Ambulatory Visit: Payer: Medicaid Other | Attending: Obstetrics and Gynecology

## 2020-08-18 ENCOUNTER — Other Ambulatory Visit: Payer: Self-pay | Admitting: Obstetrics and Gynecology

## 2020-08-18 DIAGNOSIS — Z3480 Encounter for supervision of other normal pregnancy, unspecified trimester: Secondary | ICD-10-CM | POA: Diagnosis present

## 2020-08-19 ENCOUNTER — Other Ambulatory Visit: Payer: Self-pay | Admitting: *Deleted

## 2020-08-19 DIAGNOSIS — O09891 Supervision of other high risk pregnancies, first trimester: Secondary | ICD-10-CM

## 2020-08-22 ENCOUNTER — Telehealth: Payer: Medicaid Other | Admitting: Obstetrics and Gynecology

## 2020-08-22 ENCOUNTER — Other Ambulatory Visit: Payer: Self-pay

## 2020-08-24 ENCOUNTER — Telehealth: Payer: Medicaid Other | Admitting: Obstetrics & Gynecology

## 2020-09-05 ENCOUNTER — Telehealth (INDEPENDENT_AMBULATORY_CARE_PROVIDER_SITE_OTHER): Payer: Medicaid Other | Admitting: Advanced Practice Midwife

## 2020-09-05 DIAGNOSIS — Z3480 Encounter for supervision of other normal pregnancy, unspecified trimester: Secondary | ICD-10-CM

## 2020-09-05 DIAGNOSIS — Z3A26 26 weeks gestation of pregnancy: Secondary | ICD-10-CM

## 2020-09-05 DIAGNOSIS — O99012 Anemia complicating pregnancy, second trimester: Secondary | ICD-10-CM

## 2020-09-05 DIAGNOSIS — D563 Thalassemia minor: Secondary | ICD-10-CM

## 2020-09-05 DIAGNOSIS — O0990 Supervision of high risk pregnancy, unspecified, unspecified trimester: Secondary | ICD-10-CM

## 2020-09-05 NOTE — Progress Notes (Signed)
OBSTETRICS PRENATAL VIRTUAL VISIT ENCOUNTER NOTE  Provider location: [facility] for [facility] [place]   I connected with Adolph Pollack on 09/05/20 at  1:00 PM EST by MyChart Video Encounter at home and verified that I am speaking with the correct person using two identifiers.   I discussed the limitations, risks, security and privacy concerns of performing an evaluation and management service virtually and the availability of in person appointments. I also discussed with the patient that there may be a patient responsible charge related to this service. The patient expressed understanding and agreed to proceed. Subjective:  [name] is a 35 y.o. [id number] at [redacted]w[redacted]d being seen today for ongoing prenatal care.  She is currently monitored for the following issues for this high-risk pregnancy and has Dental abscess; Supervision of high risk pregnancy, antepartum; Cervical high risk HPV (human papillomavirus) test positive; Alpha thalassemia silent carrier; and Short interval between pregnancies affecting pregnancy in first trimester, antepartum on their problem list.  Patient reports no complaints.   .  .   . Denies any leaking of fluid.   The following portions of the patient's history were reviewed and updated as appropriate: allergies, current medications, past family history, past medical history, past social history, past surgical history and problem list.   Objective:  There were no vitals filed for this visit.  Fetal Status:           General:  Alert, oriented and cooperative. Patient is in no acute distress.  Respiratory: Normal respiratory effort, no problems with respiration noted  Mental Status: Normal mood and affect. Normal behavior. Normal judgment and thought content.  Rest of physical exam deferred due to type of encounter  Imaging: [place] MFM OB DETAIL +14 WK  Result Date: 08/18/2020 ----------------------------------------------------------------------   OBSTETRICS REPORT                    (Corrected Final 08/18/2020 01:15 pm) ---------------------------------------------------------------------- Patient Info  ID #:       [id number]                          D.O.B.:  June 29, 1986 (35 yrs)  Name:       [name]                 Visit Date: 08/18/2020 11:32 am ---------------------------------------------------------------------- Performed By  Attending:        Noralee Space MD        Secondary Phy.:   [facility]  Performed By:     Tommie Raymond BS,       Address:          [address]                                                             [address]  [address]                                                             [place]                                                             [address]  Referred By:      Regis Bill             [facility]                                  [facility]  Ref. Address:     [facility]                    [facility]                    [facility]care ---------------------------------------------------------------------- Orders  #  Description                           Code        Ordered By  1  [place] MFM OB DETAIL +14 WK               [id number]    Mariel Aloe ----------------------------------------------------------------------  #  Order #                     Accession #                Episode #  1  [id number]                   [id number]                 [id number] ---------------------------------------------------------------------- Indications  [redacted] weeks gestation of pregnancy                Z3A.23  Medication exposure during first trimester of  O09.891  pregnancy (misoprostol)  Maternal alpha thalassemia-silent carrier      O99.011  BTL/Tubal reversal   Encounter for antenatal screening  for          Z36.3  malformations  Advanced maternal age multigravida 6735+,        25O09.522  second trimester  Tobacco use complicating pregnancy,            O99.332  second trimester ---------------------------------------------------------------------- Fetal Evaluation  Num Of Fetuses:         1  Fetal Heart Rate(bpm):  155  Cardiac Activity:       Observed  Presentation:           Breech  Placenta:               Posterior  P. Cord Insertion:      Visualized, central  Amniotic Fluid  AFI FV:      Within normal limits                              Largest Pocket(cm)                              5.7 ---------------------------------------------------------------------- Biometry  BPD:      53.5  mm     G. Age:  22w 2d         10  %    CI:        70.29   %    70 - 86                                                          FL/HC:      20.5   %    19.2 - 20.8  HC:      203.5  mm     G. Age:  22w 3d          7  %    HC/AC:      1.02        1.05 - 1.21  AC:      199.1  mm     G. Age:  24w 4d         77  %    FL/BPD:     77.9   %    71 - 87  FL:       41.7  mm     G. Age:  23w 4d         42  %    FL/AC:      20.9   %    20 - 24  HUM:      39.6  mm     G. Age:  24w 1d         58  %  CER:      27.4  mm     G. Age:  24w 3d         91  %  LV:        7.1  mm  CM:        5.3  mm  Est. FW:     637  gm      1 lb 6 oz     64  % ---------------------------------------------------------------------- OB History  Gravidity:    6  Term:   4        Prem:   0        SAB:   1  Living:       4 ---------------------------------------------------------------------- Gestational Age  LMP:           23w 5d        Date:  03/05/20                 EDD:   12/10/20  U/S Today:     23w 2d                                        EDD:   12/13/20  Best:          23w 3d     Det. By:  U/S C R L  (06/02/20)    EDD:   12/12/20 ---------------------------------------------------------------------- Anatomy  Cranium:                Appears normal         Aortic Arch:            Appears normal  Cavum:                 Appears normal         Ductal Arch:            Appears normal  Ventricles:            Appears normal         Diaphragm:              Appears normal  Choroid Plexus:        Appears normal         Stomach:                Appears normal, left                                                                        sided  Cerebellum:            Appears normal         Abdomen:                Appears normal  Posterior Fossa:       Appears normal         Abdominal Wall:         Appears nml (cord                                                                        insert, abd wall)  Nuchal Fold:           Not applicable (>20    Cord Vessels:           Appears normal ([redacted]  wks GA)                                        vessel cord)  Face:                  Appears normal         Kidneys:                Appear normal                         (orbits and profile)  Lips:                  Appears normal         Bladder:                Appears normal  Thoracic:              Appears normal         Spine:                  Appears normal  Heart:                 Appears normal         Upper Extremities:      Appears normal                         (4CH, axis, and                         situs)  RVOT:                  Appears normal         Lower Extremities:      Appears normal  LVOT:                  Appears normal  Other:  Technically difficult due to fetal position. ---------------------------------------------------------------------- Cervix Uterus Adnexa  Cervix  Length:            3.9  cm.  Normal appearance by transabdominal scan.  Uterus  No abnormality visualized.  Right Ovary  Within normal limits.  Left Ovary  Within normal limits.  Cul De Sac  No free fluid seen.  Adnexa  No abnormality visualized. ---------------------------------------------------------------------- Impression  Patient returned for fetal  anatomy scan.  Ms. approach tall  exposure in the first trimester.  She had a tubal reversal  surgery in 2010.  On cell free fetal DNA screening, the risks of fetal  aneuploidies are not increased.MSAFP screening showed  low risk for open-neural tube defects .  We performed fetal anatomy scan. No makers of  aneuploidies or fetal structural defects are seen. Fetal  biometry is consistent with her previously-established dates.  Amniotic fluid is normal and good fetal activity is seen.  Patient understands the limitations of ultrasound in detecting  fetal anomalies.  Patient reports.  She recently quit smoking.  Patient is a silent carrier for alpha thalassemia (aa/a-) .I  briefly discussed the genetics including partner screening. I  recommended that she meet with our genetic counselor.  Patient opted not to have genetic counseling. ---------------------------------------------------------------------- Recommendations  -An appointment was made for her to return in 4 weeks for  fetal growth assessment. ----------------------------------------------------------------------  Noralee Space, MD Electronically Signed Corrected Final Report  08/18/2020 01:15 pm ----------------------------------------------------------------------   Assessment and Plan:  Pregnancy: [id number] at [redacted]w[redacted]d 1. Supervision of other normal pregnancy, antepartum --Pt reports good fetal movement, denies cramping, LOF, or vaginal bleeding --BP 102/72 at home today --Reviewed normal anatomy US with pt --Anticipatory guidance about next visits/weeks of pregnancy given. --Next visit in 2 weeks in office for GTT  2. Alpha thalassemia silent carrier   3. [redacted] weeks gestation of pregnancy   Preterm labor symptoms and general obstetric precautions including but not limited to vaginal bleeding, contractions, leaking of fluid and fetal movement were reviewed in detail with the patient. I discussed the assessment and treatment  plan with the patient. The patient was provided an opportunity to ask questions and all were answered. The patient agreed with the plan and demonstrated an understanding of the instructions. The patient was advised to call back or seek an in-person office evaluation/go to MAU at [facility] for any urgent or concerning symptoms. Please refer to After Visit Summary for other counseling recommendations.   I provided 5 minutes of face-to-face time during this encounter.  No follow-ups on file.  Future Appointments  Date Time Provider Department Center  10/20/2020 10:45 AM [facility]    Sharen Counter, CNM Center for [employer], [facility]

## 2020-09-19 ENCOUNTER — Encounter: Payer: Medicaid Other | Admitting: Obstetrics

## 2020-09-19 ENCOUNTER — Other Ambulatory Visit: Payer: Medicaid Other

## 2020-09-22 ENCOUNTER — Other Ambulatory Visit: Payer: Medicaid Other

## 2020-09-22 ENCOUNTER — Encounter: Payer: Medicaid Other | Admitting: Obstetrics

## 2020-09-27 ENCOUNTER — Encounter: Payer: Medicaid Other | Admitting: Obstetrics

## 2020-09-27 ENCOUNTER — Other Ambulatory Visit: Payer: Medicaid Other

## 2020-10-20 ENCOUNTER — Ambulatory Visit: Payer: Medicaid Other

## 2020-10-24 ENCOUNTER — Ambulatory Visit: Payer: Medicaid Other | Attending: Obstetrics and Gynecology

## 2020-10-24 ENCOUNTER — Other Ambulatory Visit: Payer: Self-pay

## 2020-10-24 DIAGNOSIS — O09891 Supervision of other high risk pregnancies, first trimester: Secondary | ICD-10-CM | POA: Insufficient documentation

## 2020-10-24 IMAGING — US US MFM OB FOLLOW-UP
1 series · 14 of 28 positions shown · non-contrast
Comparison: none

[Series 1: us mfm ob follow-up · 14 of 60 slices shown]
[im 3/60]
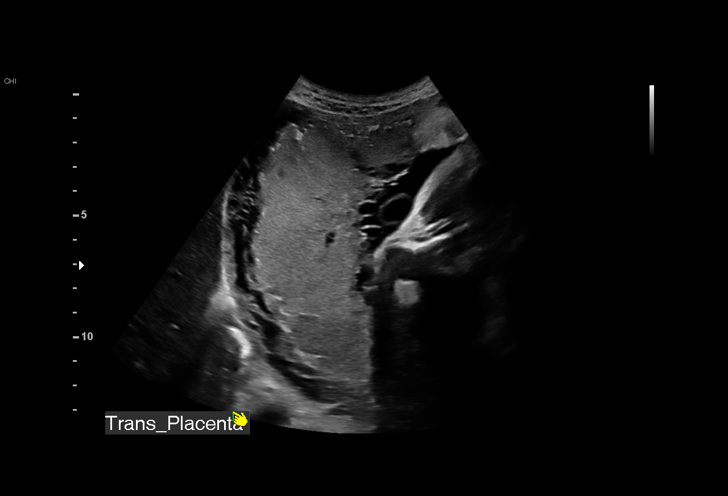
[im 7/60]
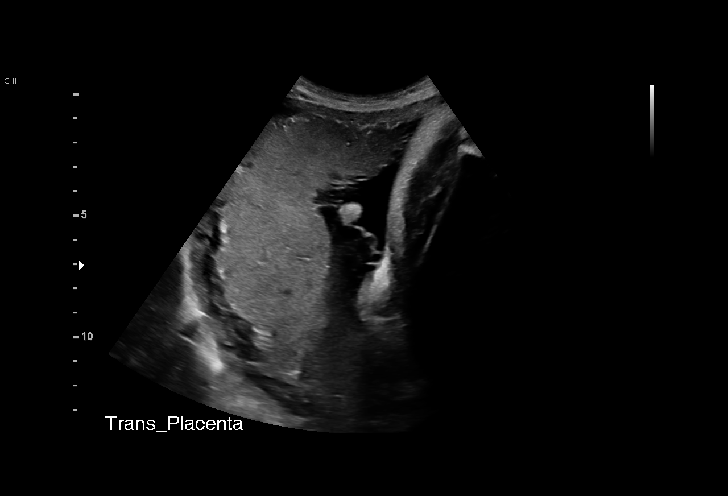
[im 11/60]
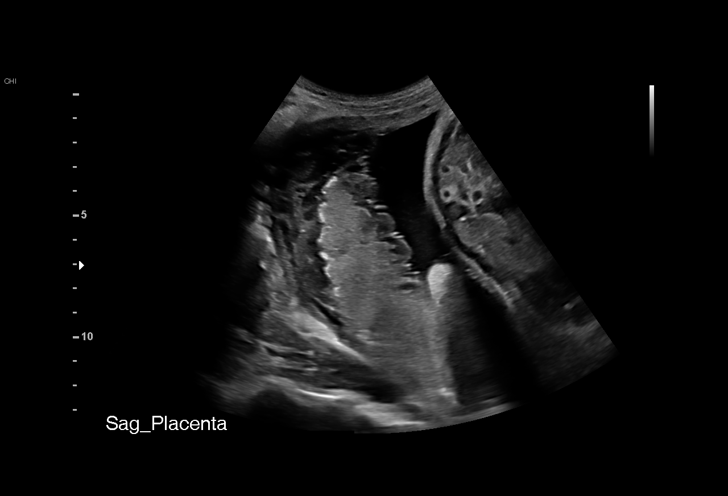
[im 16/60]
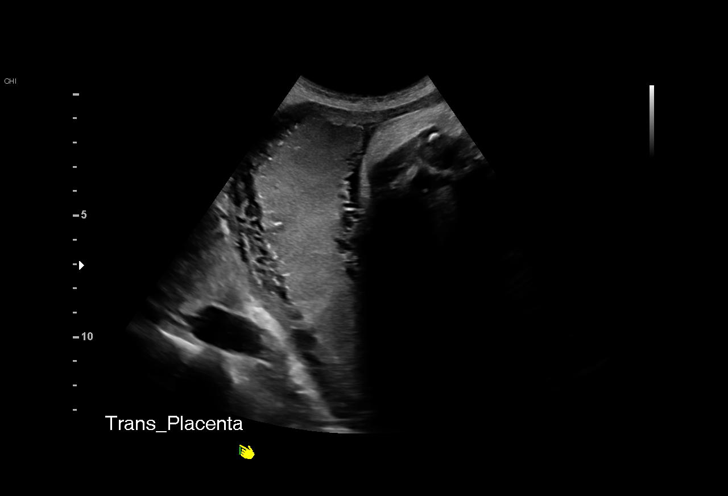
[im 20/60]
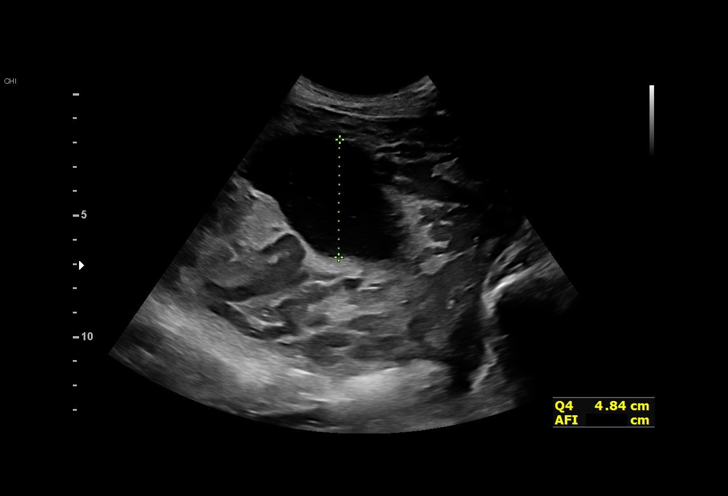
[im 25/60]
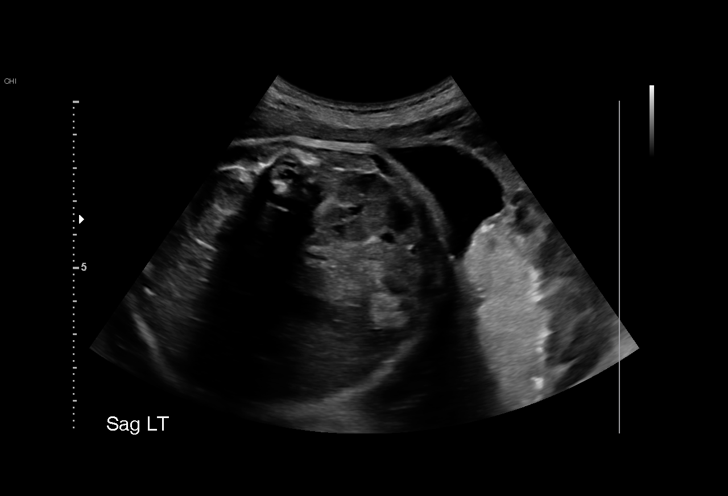
[im 29/60]
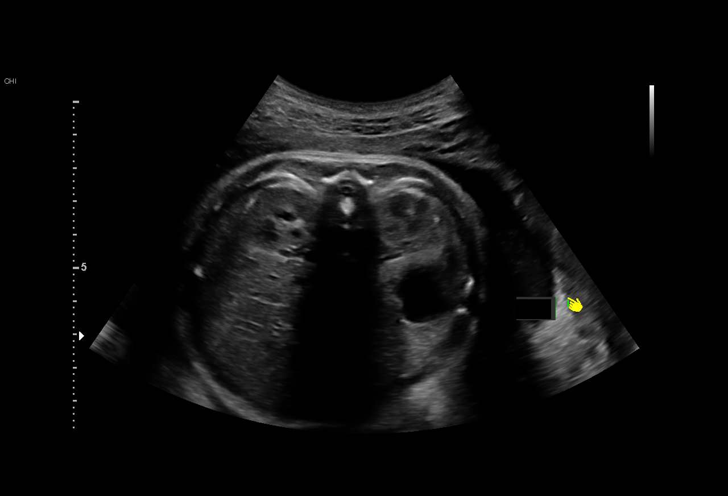
[im 33/60]
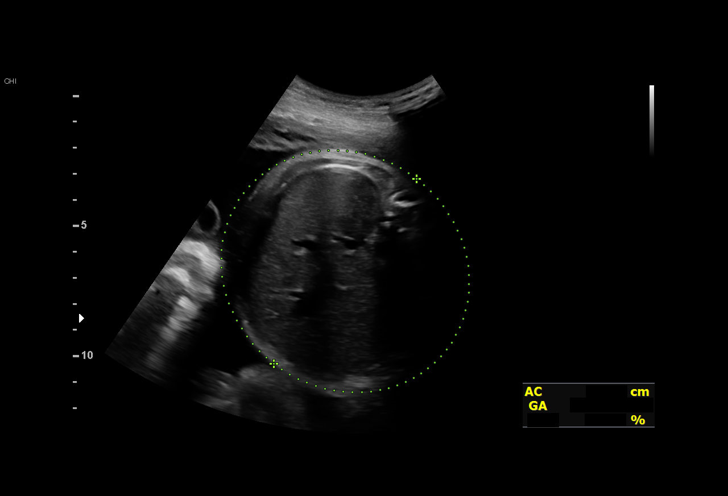
[im 38/60]
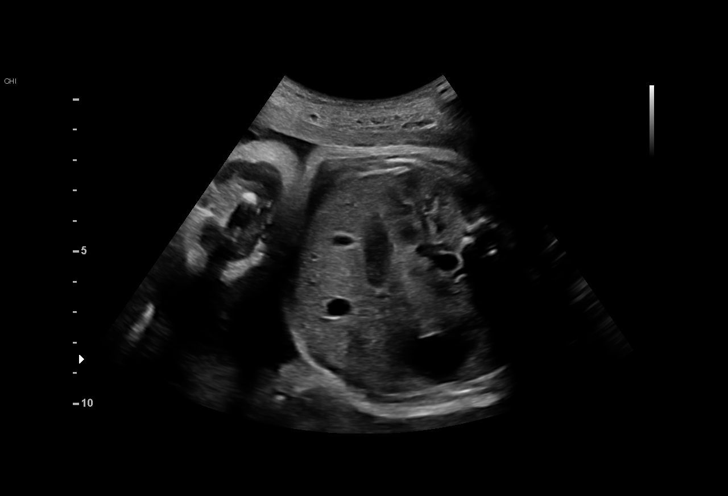
[im 42/60]
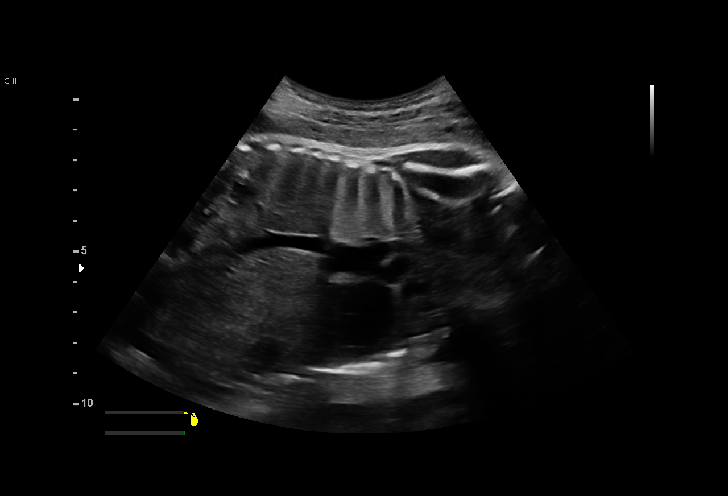
[im 46/60]
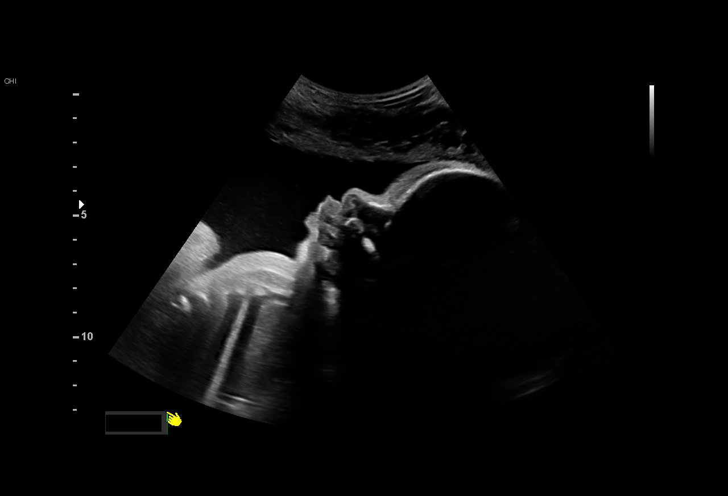
[im 51/60]
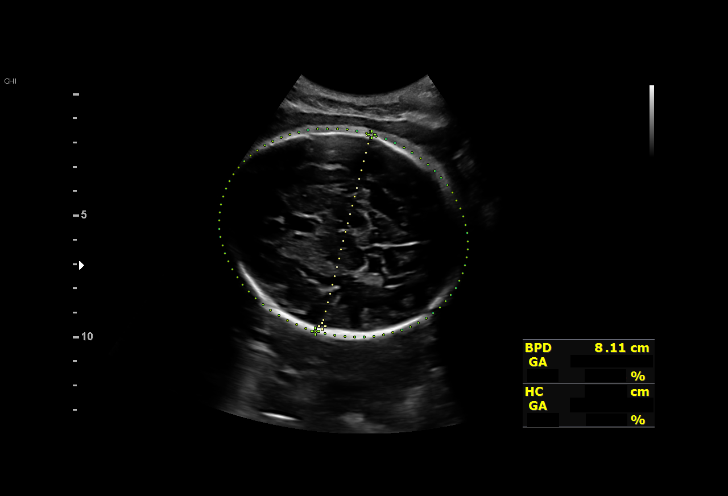
[im 55/60]
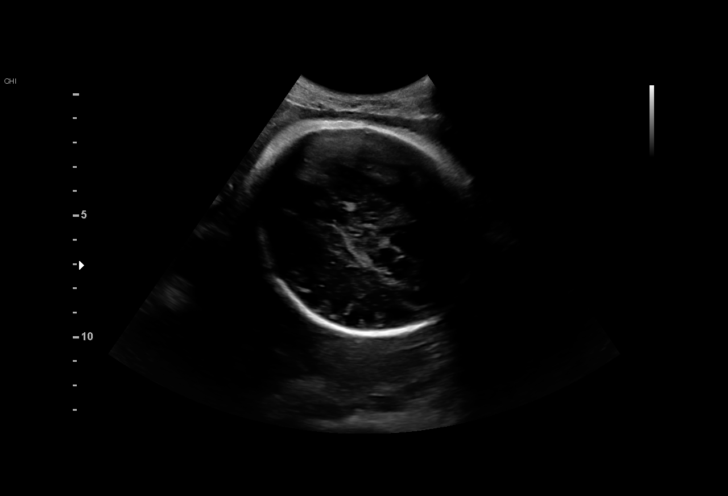
[im 60/60]
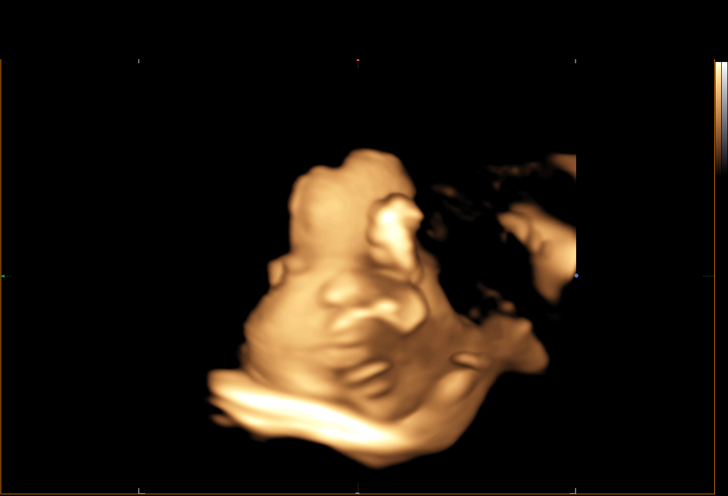

[14 of 28 positions shown; findings below may reference images not displayed]

[REDACTED]care

Indications

 Tobacco use complicating pregnancy, third      [6Y]
 trimester
 Advanced maternal age primigravida 35+,        [6Y]
 third trimester
 Medication exposure during first trimester of  [6Y]
 pregnancy (misoprostol)
 Maternal alpha thalassemia-silent carrier      [6Y]
 BTL/Tubal reversal
 33 weeks gestation of pregnancy
Fetal Evaluation

 Num Of Fetuses:         1
 Fetal Heart Rate(bpm):  145
 Cardiac Activity:       Observed
 Presentation:           Cephalic
 Placenta:               Posterior Fundal
 P. Cord Insertion:      Visualized, central

 Amniotic Fluid
 AFI FV:      Within normal limits

 AFI Sum(cm)     %Tile       Largest Pocket(cm)
 19.11           71
 RUQ(cm)       RLQ(cm)       LUQ(cm)        LLQ(cm)

Biometry

 BPD:      81.7  mm     G. Age:  32w 6d         38  %    CI:        76.98   %    70 - 86
                                                         FL/HC:      22.0   %    19.9 -
 HC:      294.9  mm     G. Age:  32w 4d          9  %    HC/AC:      1.00        0.96 -
 AC:      294.7  mm     G. Age:  33w 3d         65  %    FL/BPD:     79.4   %    71 - 87
 FL:       64.9  mm     G. Age:  33w 3d         52  %    FL/AC:      22.0   %    20 - 24
 LV:        3.1  mm

 Est. FW:    [6Y]  gm    4 lb 12 oz      51  %
OB History

 Gravidity:    6         Term:   4        Prem:   0        SAB:   1
 Living:       4
Gestational Age

 LMP:           33w 2d        Date:  [DATE]                 EDD:   [DATE]
 U/S Today:     33w 1d                                        EDD:   [DATE]
 Best:          33w 0d     Det. By:  U/S C R L  ([DATE])    EDD:   [DATE]
Anatomy

 Cranium:               Appears normal         Aortic Arch:            Previously seen
 Cavum:                 Appears normal         Ductal Arch:            Previously seen
 Ventricles:            Appears normal         Diaphragm:              Previously seen
 Choroid Plexus:        Previously seen        Stomach:                Appears normal, left
                                                                       sided
 Cerebellum:            Previously seen        Abdomen:                Appears normal
 Posterior Fossa:       Previously seen        Abdominal Wall:         Previously seen
 Nuchal Fold:           Not applicable (>20    Cord Vessels:           Previously seen
                        wks GA)
 Face:                  Orbits and profile     Kidneys:                Appear normal
                        previously seen
 Lips:                  Previously seen        Bladder:                Appears normal
 Thoracic:              Appears normal         Spine:                  Previously seen
 Heart:                 Appears normal         Upper Extremities:      Previously seen
                        (4CH, axis, and
                        situs)
 RVOT:                  Previously seen        Lower Extremities:      Previously seen
 LVOT:                  Previously seen

 Other:  IVC/SVC seen and WNL.
Comments

 This patient was seen for a follow up growth scan due to
 advanced maternal age and misoprostol exposure during the
 first trimester.  She denies any problems in her current
 pregnancy.
 She was informed that the fetal growth and amniotic fluid
 level appears appropriate for her gestational age.
 As the fetal growth is within normal limits, no further exams
 were scheduled in our office.

## 2020-10-26 ENCOUNTER — Other Ambulatory Visit: Payer: Medicaid Other

## 2020-10-26 ENCOUNTER — Telehealth: Payer: Medicaid Other | Admitting: Women's Health

## 2020-11-01 ENCOUNTER — Other Ambulatory Visit: Payer: Self-pay

## 2020-11-01 ENCOUNTER — Ambulatory Visit (INDEPENDENT_AMBULATORY_CARE_PROVIDER_SITE_OTHER): Payer: Medicaid Other

## 2020-11-01 ENCOUNTER — Encounter: Payer: Self-pay | Admitting: Nurse Practitioner

## 2020-11-01 ENCOUNTER — Other Ambulatory Visit: Payer: Medicaid Other

## 2020-11-01 VITALS — BP 101/64 | HR 77 | Wt 145.0 lb

## 2020-11-01 DIAGNOSIS — O0990 Supervision of high risk pregnancy, unspecified, unspecified trimester: Secondary | ICD-10-CM

## 2020-11-01 DIAGNOSIS — O0932 Supervision of pregnancy with insufficient antenatal care, second trimester: Secondary | ICD-10-CM

## 2020-11-01 NOTE — Progress Notes (Signed)
ROB [redacted]w[redacted]d Tdap Declined PHQ9=1   CC: None

## 2020-11-01 NOTE — Progress Notes (Signed)
   PRENATAL VISIT NOTE  Subjective:  [name] is a 35 y.o. [id number] at [redacted]w[redacted]d being seen today for ongoing prenatal care.  She is currently monitored for the following issues for this high-risk pregnancy and has Dental abscess; Supervision of high risk pregnancy, antepartum; Cervical high risk HPV (human papillomavirus) test positive; Alpha thalassemia silent carrier; and Short interval between pregnancies affecting pregnancy in first trimester, antepartum on their problem list.  Patient reports no complaints.  Contractions: Irritability. Vag. Bleeding: None.  Movement: Present. Denies leaking of fluid.   Patient has not had a prenatal visit since 26 weeks. Patient reports that she "just didn't feel like coming in." Denies signs/symptoms of depression/anxiety.   The following portions of the patient's history were reviewed and updated as appropriate: allergies, current medications, past family history, past medical history, past social history, past surgical history and problem list.   Objective:   Vitals:   11/01/20 1000  BP: 101/64  Pulse: 77  Weight: 145 lb (65.8 kg)    Fetal Status: Fetal Heart Rate (bpm): 140 Fundal Height: 33 cm Movement: Present     General:  Alert, oriented and cooperative. Patient is in no acute distress.  Skin: Skin is warm and dry. No rash noted.   Cardiovascular: Normal heart rate noted  Respiratory: Normal respiratory effort, no problems with respiration noted  Abdomen: Soft, gravid, appropriate for gestational age.  Pain/Pressure: Present     Pelvic: Cervical exam deferred        Extremities: Normal range of motion.  Edema: Trace  Mental Status: Normal mood and affect. Normal behavior. Normal judgment and thought content.   Assessment and Plan:  Pregnancy: [id number] at [redacted]w[redacted]d 1. Supervision of high risk pregnancy, antepartum - Glucose Tolerance, 2 Hours w/1 Hour - CBC - RPR - HIV Antibody (routine testing w rflx)   Preterm labor symptoms and  general obstetric precautions including but not limited to vaginal bleeding, contractions, leaking of fluid and fetal movement were reviewed in detail with the patient. Please refer to After Visit Summary for other counseling recommendations.   Return in about 2 weeks (around 11/15/2020) for in person ROB.  No future appointments.  Brand Males, CNM

## 2020-11-02 LAB — CBC
Hematocrit: 30.1 % — ABNORMAL LOW (ref 34.0–46.6)
Hemoglobin: 9.7 g/dL — ABNORMAL LOW (ref 11.1–15.9)
MCH: 28 pg (ref 26.6–33.0)
MCHC: 32.2 g/dL (ref 31.5–35.7)
MCV: 87 fL (ref 79–97)
Platelets: 219 10*3/uL (ref 150–450)
RBC: 3.47 x10E6/uL — ABNORMAL LOW (ref 3.77–5.28)
RDW: 13.5 % (ref 11.7–15.4)
WBC: 6.8 10*3/uL (ref 3.4–10.8)

## 2020-11-02 LAB — GLUCOSE TOLERANCE, 2 HOURS W/ 1HR
Glucose, 1 hour: 124 mg/dL (ref 65–179)
Glucose, 2 hour: 74 mg/dL (ref 65–152)
Glucose, Fasting: 82 mg/dL (ref 65–91)

## 2020-11-02 LAB — HIV ANTIBODY (ROUTINE TESTING W REFLEX): HIV Screen 4th Generation wRfx: NONREACTIVE

## 2020-11-02 LAB — RPR: RPR Ser Ql: NONREACTIVE

## 2020-11-03 ENCOUNTER — Telehealth: Payer: Self-pay

## 2020-11-03 NOTE — Telephone Encounter (Signed)
Called pt about results, no answer, unable to leave vm

## 2020-11-07 ENCOUNTER — Other Ambulatory Visit: Payer: Self-pay

## 2020-11-07 DIAGNOSIS — O0940 Supervision of pregnancy with grand multiparity, unspecified trimester: Secondary | ICD-10-CM

## 2020-11-07 MED ORDER — IRON (FERROUS SULFATE) 325 (65 FE) MG PO TABS: 1.0000 | ORAL_TABLET | Freq: Two times a day (BID) | ORAL | 3 refills | Status: DC

## 2020-11-15 ENCOUNTER — Encounter: Payer: Medicaid Other | Admitting: Obstetrics

## 2020-12-01 ENCOUNTER — Inpatient Hospital Stay (HOSPITAL_COMMUNITY)
Admission: AD | Admit: 2020-12-01 | Discharge: 2020-12-03 | DRG: 807 | Disposition: A | Discharge status: Home / Self Care | Payer: Medicaid Other | Attending: Obstetrics & Gynecology | Admitting: Obstetrics & Gynecology

## 2020-12-01 ENCOUNTER — Other Ambulatory Visit: Payer: Self-pay

## 2020-12-01 ENCOUNTER — Encounter (HOSPITAL_COMMUNITY): Payer: Self-pay | Admitting: Obstetrics and Gynecology

## 2020-12-01 DIAGNOSIS — D563 Thalassemia minor: Secondary | ICD-10-CM | POA: Diagnosis present

## 2020-12-01 DIAGNOSIS — O4202 Full-term premature rupture of membranes, onset of labor within 24 hours of rupture: Secondary | ICD-10-CM | POA: Diagnosis not present

## 2020-12-01 DIAGNOSIS — Z3A38 38 weeks gestation of pregnancy: Secondary | ICD-10-CM | POA: Diagnosis not present

## 2020-12-01 DIAGNOSIS — F17210 Nicotine dependence, cigarettes, uncomplicated: Secondary | ICD-10-CM | POA: Diagnosis present

## 2020-12-01 DIAGNOSIS — O99334 Smoking (tobacco) complicating childbirth: Principal | ICD-10-CM | POA: Diagnosis present

## 2020-12-01 DIAGNOSIS — Z20822 Contact with and (suspected) exposure to covid-19: Secondary | ICD-10-CM | POA: Diagnosis present

## 2020-12-01 DIAGNOSIS — O26893 Other specified pregnancy related conditions, third trimester: Secondary | ICD-10-CM | POA: Diagnosis present

## 2020-12-01 LAB — POCT FERN TEST: POCT Fern Test: POSITIVE

## 2020-12-01 MED ORDER — LACTATED RINGERS IV SOLN: INTRAVENOUS | Status: DC

## 2020-12-01 NOTE — MAU Note (Addendum)
Pt reports she has felt LOF since 2100. Continues to leak out. Reports no odor, clear fluid. Reprots +FM. Denies VB.

## 2020-12-02 ENCOUNTER — Inpatient Hospital Stay (HOSPITAL_COMMUNITY): Payer: Medicaid Other | Admitting: Anesthesiology

## 2020-12-02 ENCOUNTER — Other Ambulatory Visit: Payer: Self-pay

## 2020-12-02 ENCOUNTER — Encounter (HOSPITAL_COMMUNITY): Payer: Self-pay | Admitting: Obstetrics and Gynecology

## 2020-12-02 DIAGNOSIS — Z3A38 38 weeks gestation of pregnancy: Secondary | ICD-10-CM

## 2020-12-02 DIAGNOSIS — O4202 Full-term premature rupture of membranes, onset of labor within 24 hours of rupture: Secondary | ICD-10-CM

## 2020-12-02 LAB — CBC
HCT: 34.5 % — ABNORMAL LOW (ref 36.0–46.0)
Hemoglobin: 11.4 g/dL — ABNORMAL LOW (ref 12.0–15.0)
MCH: 28.2 pg (ref 26.0–34.0)
MCHC: 33 g/dL (ref 30.0–36.0)
MCV: 85.4 fL (ref 80.0–100.0)
Platelets: 247 10*3/uL (ref 150–400)
RBC: 4.04 MIL/uL (ref 3.87–5.11)
RDW: 14 % (ref 11.5–15.5)
WBC: 10.1 10*3/uL (ref 4.0–10.5)
nRBC: 0 % (ref 0.0–0.2)

## 2020-12-02 LAB — COMPREHENSIVE METABOLIC PANEL
ALT: 22 U/L (ref 0–44)
AST: 25 U/L (ref 15–41)
Albumin: 2.6 g/dL — ABNORMAL LOW (ref 3.5–5.0)
Alkaline Phosphatase: 142 U/L — ABNORMAL HIGH (ref 38–126)
Anion gap: 5 (ref 5–15)
BUN: <5 mg/dL — ABNORMAL LOW (ref 6–20)
CO2: 24 mmol/L (ref 22–32)
Calcium: 8.2 mg/dL — ABNORMAL LOW (ref 8.9–10.3)
Chloride: 104 mmol/L (ref 98–111)
Creatinine, Ser: 0.62 mg/dL (ref 0.44–1.00)
GFR, Estimated: >60 mL/min (ref >60)
Glucose, Bld: 87 mg/dL (ref 70–99)
Potassium: 3.5 mmol/L (ref 3.5–5.1)
Sodium: 133 mmol/L — ABNORMAL LOW (ref 135–145)
Total Bilirubin: 0.5 mg/dL (ref 0.3–1.2)
Total Protein: 5.9 g/dL — ABNORMAL LOW (ref 6.5–8.1)

## 2020-12-02 LAB — RPR: RPR Ser Ql: NONREACTIVE

## 2020-12-02 LAB — TYPE AND SCREEN
ABO/RH(D): O POS
Antibody Screen: NEGATIVE

## 2020-12-02 LAB — PROTEIN / CREATININE RATIO, URINE
Creatinine, Urine: 169.7 mg/dL
Protein Creatinine Ratio: 0.24 mg/mg{Cre} — ABNORMAL HIGH (ref 0.00–0.15)
Total Protein, Urine: 40 mg/dL

## 2020-12-02 LAB — GC/CHLAMYDIA PROBE AMP (~~LOC~~) NOT AT ARMC
Chlamydia: NEGATIVE
Comment: NEGATIVE
Comment: NORMAL
Neisseria Gonorrhea: NEGATIVE

## 2020-12-02 LAB — RESP PANEL BY RT-PCR (FLU A&B, COVID) ARPGX2
Influenza A by PCR: NEGATIVE
Influenza B by PCR: NEGATIVE
SARS Coronavirus 2 by RT PCR: NEGATIVE

## 2020-12-02 LAB — RAPID URINE DRUG SCREEN, HOSP PERFORMED
Amphetamines: NOT DETECTED
Barbiturates: NOT DETECTED
Benzodiazepines: NOT DETECTED
Cocaine: NOT DETECTED
Opiates: NOT DETECTED
Tetrahydrocannabinol: POSITIVE — AB

## 2020-12-02 LAB — GROUP B STREP BY PCR: Group B strep by PCR: NEGATIVE

## 2020-12-02 MED ORDER — ONDANSETRON HCL 4 MG PO TABS
4.0000 mg | ORAL_TABLET | ORAL | Status: DC | PRN
  Filled 2020-12-02: qty 1

## 2020-12-02 MED ORDER — ONDANSETRON HCL 4 MG/2ML IJ SOLN
4.0000 mg | Freq: Four times a day (QID) | INTRAMUSCULAR | Status: DC | PRN
  Administered 2020-12-02: 4 mg via INTRAVENOUS
  Filled 2020-12-02: qty 2

## 2020-12-02 MED ORDER — ACETAMINOPHEN 325 MG PO TABS: 650.0000 mg | ORAL_TABLET | ORAL | Status: DC | PRN

## 2020-12-02 MED ORDER — IBUPROFEN 600 MG PO TABS: 600.0000 mg | ORAL_TABLET | Freq: Once | ORAL | Status: DC

## 2020-12-02 MED ORDER — FENTANYL-BUPIVACAINE-NACL 0.5-0.125-0.9 MG/250ML-% EP SOLN
12.0000 mL/h | EPIDURAL | Status: DC | PRN
Start: 2020-12-02 — End: 2020-12-02
  Administered 2020-12-02: 12 mL/h via EPIDURAL

## 2020-12-02 MED ORDER — DIPHENHYDRAMINE HCL 50 MG/ML IJ SOLN
12.5000 mg | INTRAMUSCULAR | Status: DC | PRN
  Administered 2020-12-02: 12.5 mg via INTRAVENOUS
  Filled 2020-12-02: qty 1

## 2020-12-02 MED ORDER — OXYTOCIN BOLUS FROM INFUSION
333.0000 mL | Freq: Once | INTRAVENOUS | Status: AC
  Administered 2020-12-02: 333 mL via INTRAVENOUS

## 2020-12-02 MED ORDER — COCONUT OIL OIL: 1.0000 "application " | TOPICAL_OIL | Status: DC | PRN

## 2020-12-02 MED ORDER — SIMETHICONE 80 MG PO CHEW: 80.0000 mg | CHEWABLE_TABLET | ORAL | Status: DC | PRN

## 2020-12-02 MED ORDER — DIPHENHYDRAMINE HCL 25 MG PO CAPS
25.0000 mg | ORAL_CAPSULE | Freq: Four times a day (QID) | ORAL | Status: DC | PRN
Start: 2020-12-02 — End: 2020-12-04

## 2020-12-02 MED ORDER — LIDOCAINE HCL (PF) 1 % IJ SOLN: 30.0000 mL | INTRAMUSCULAR | Status: DC | PRN

## 2020-12-02 MED ORDER — PHENYLEPHRINE 40 MCG/ML (10ML) SYRINGE FOR IV PUSH (FOR BLOOD PRESSURE SUPPORT)
80.0000 ug | PREFILLED_SYRINGE | INTRAVENOUS | Status: DC | PRN
  Filled 2020-12-02: qty 10

## 2020-12-02 MED ORDER — BENZOCAINE-MENTHOL 20-0.5 % EX AERO: 1.0000 "application " | INHALATION_SPRAY | CUTANEOUS | Status: DC | PRN

## 2020-12-02 MED ORDER — MAGNESIUM HYDROXIDE 400 MG/5ML PO SUSP: 30.0000 mL | ORAL | Status: DC | PRN

## 2020-12-02 MED ORDER — LACTATED RINGERS IV SOLN: 500.0000 mL | Freq: Once | INTRAVENOUS | Status: DC

## 2020-12-02 MED ORDER — SOD CITRATE-CITRIC ACID 500-334 MG/5ML PO SOLN: 30.0000 mL | ORAL | Status: DC | PRN

## 2020-12-02 MED ORDER — WITCH HAZEL-GLYCERIN EX PADS: 1.0000 "application " | MEDICATED_PAD | CUTANEOUS | Status: DC | PRN

## 2020-12-02 MED ORDER — OXYCODONE-ACETAMINOPHEN 5-325 MG PO TABS: 1.0000 | ORAL_TABLET | ORAL | Status: DC | PRN

## 2020-12-02 MED ORDER — OXYCODONE-ACETAMINOPHEN 5-325 MG PO TABS: 2.0000 | ORAL_TABLET | ORAL | Status: DC | PRN

## 2020-12-02 MED ORDER — IBUPROFEN 600 MG PO TABS
600.0000 mg | ORAL_TABLET | Freq: Four times a day (QID) | ORAL | Status: DC
  Administered 2020-12-02 – 2020-12-03 (×4): 600 mg via ORAL
  Filled 2020-12-02 (×4): qty 1

## 2020-12-02 MED ORDER — LACTATED RINGERS IV SOLN: 500.0000 mL | INTRAVENOUS | Status: DC | PRN

## 2020-12-02 MED ORDER — ACETAMINOPHEN 325 MG PO TABS
650.0000 mg | ORAL_TABLET | ORAL | Status: DC | PRN
  Administered 2020-12-02 – 2020-12-03 (×3): 650 mg via ORAL
  Filled 2020-12-02 (×3): qty 2

## 2020-12-02 MED ORDER — EPHEDRINE 5 MG/ML INJ: 10.0000 mg | INTRAVENOUS | Status: DC | PRN

## 2020-12-02 MED ORDER — TERBUTALINE SULFATE 1 MG/ML IJ SOLN
0.2500 mg | Freq: Once | INTRAMUSCULAR | Status: AC | PRN
  Administered 2020-12-02: 0.25 mg via SUBCUTANEOUS
  Filled 2020-12-02: qty 1

## 2020-12-02 MED ORDER — OXYTOCIN-SODIUM CHLORIDE 30-0.9 UT/500ML-% IV SOLN
2.5000 [IU]/h | INTRAVENOUS | Status: DC
  Administered 2020-12-02: 2.5 [IU]/h via INTRAVENOUS

## 2020-12-02 MED ORDER — PHENYLEPHRINE 40 MCG/ML (10ML) SYRINGE FOR IV PUSH (FOR BLOOD PRESSURE SUPPORT)
80.0000 ug | PREFILLED_SYRINGE | INTRAVENOUS | Status: DC | PRN
  Administered 2020-12-02 (×2): 80 ug via INTRAVENOUS

## 2020-12-02 MED ORDER — DIBUCAINE (PERIANAL) 1 % EX OINT: 1.0000 "application " | TOPICAL_OINTMENT | CUTANEOUS | Status: DC | PRN

## 2020-12-02 MED ORDER — MEDROXYPROGESTERONE ACETATE 150 MG/ML IM SUSP
150.0000 mg | Freq: Once | INTRAMUSCULAR | Status: AC
  Administered 2020-12-03: 150 mg via INTRAMUSCULAR
  Filled 2020-12-02: qty 1

## 2020-12-02 MED ORDER — PRENATAL MULTIVITAMIN CH
1.0000 | ORAL_TABLET | Freq: Every day | ORAL | Status: DC
Start: 2020-12-03 — End: 2020-12-04

## 2020-12-02 MED ORDER — TETANUS-DIPHTH-ACELL PERTUSSIS 5-2.5-18.5 LF-MCG/0.5 IM SUSY: 0.5000 mL | PREFILLED_SYRINGE | Freq: Once | INTRAMUSCULAR | Status: DC

## 2020-12-02 MED ORDER — LIDOCAINE HCL (PF) 1 % IJ SOLN
INTRAMUSCULAR | Status: DC | PRN
  Administered 2020-12-02: 2 mL via EPIDURAL
  Administered 2020-12-02: 3 mL via EPIDURAL
  Administered 2020-12-02: 5 mL via EPIDURAL

## 2020-12-02 MED ORDER — ONDANSETRON HCL 4 MG/2ML IJ SOLN
4.0000 mg | INTRAMUSCULAR | Status: DC | PRN
  Filled 2020-12-02: qty 2

## 2020-12-02 MED ORDER — FENTANYL-BUPIVACAINE-NACL 0.5-0.125-0.9 MG/250ML-% EP SOLN
EPIDURAL | Status: AC
  Filled 2020-12-02: qty 250

## 2020-12-02 MED ORDER — OXYTOCIN-SODIUM CHLORIDE 30-0.9 UT/500ML-% IV SOLN
1.0000 m[IU]/min | INTRAVENOUS | Status: DC
  Administered 2020-12-02 (×2): 2 m[IU]/min via INTRAVENOUS
  Filled 2020-12-02: qty 500

## 2020-12-02 NOTE — Progress Notes (Signed)
[name] is a 35 y.o. [id number] at [redacted]w[redacted]d admitted for SROM.  Subjective: [name] is doing well. Reports some cramping and intermittent pelvic pressure.   Objective: BP 130/79   Pulse 82   Resp 18   Ht 5\' 4"  (1.626 m)   Wt 65.8 kg   LMP 03/05/2020 (Approximate)   BMI 24.90 kg/m  No intake/output data recorded. No intake/output data recorded.  FHT: 135 bpm, moderate variability, +accels, no decels UC: irregular SVE:   Dilation: 6 Effacement (%): 50 Station: -1,0 Exam by:: [email or web address], RN  Labs: Lab Results  Component Value Date   WBC 10.1 12/02/2020   HGB 11.4 (L) 12/02/2020   HCT 34.5 (L) 12/02/2020   MCV 85.4 12/02/2020   PLT 247 12/02/2020    Assessment / Plan: [name] 34 y.o. [name] at [redacted]w[redacted]d admitted for SROM  Labor: No cervical change from previous exam. Will start Pitocin. Preeclampsia: 1 isolated elevated BP, labs wnl, pt asymptomatic, continue to monitor BP's Fetal Wellbeing:  Category I Pain Control:  IV pain medication or epidural prn I/D: GBS negative Anticipated MOD: Anticipate SVD  [redacted]w[redacted]d, CNM 12/02/2020, 3:45 AM

## 2020-12-02 NOTE — Anesthesia Preprocedure Evaluation (Signed)
Anesthesia Evaluation  Patient identified by MRN, date of birth, ID band Patient awake    Reviewed: Allergy & Precautions, Patient's Chart, lab work & pertinent test results  Airway Mallampati: II  TM Distance: >3 FB     Dental   Pulmonary Current Smoker,    Pulmonary exam normal        Cardiovascular negative cardio ROS Normal cardiovascular exam     Neuro/Psych negative neurological ROS     GI/Hepatic negative GI ROS, Neg liver ROS,   Endo/Other  negative endocrine ROS  Renal/GU negative Renal ROS     Musculoskeletal   Abdominal   Peds  Hematology  (+) anemia ,   Anesthesia Other Findings   Reproductive/Obstetrics (+) Pregnancy                             Lab Results  Component Value Date   WBC 10.1 12/02/2020   HGB 11.4 (L) 12/02/2020   HCT 34.5 (L) 12/02/2020   MCV 85.4 12/02/2020   PLT 247 12/02/2020    Anesthesia Physical Anesthesia Plan  ASA: II  Anesthesia Plan: Epidural   Post-op Pain Management:    Induction:   PONV Risk Score and Plan: Treatment may vary due to age or medical condition  Airway Management Planned: Natural Airway  Additional Equipment:   Intra-op Plan:   Post-operative Plan:   Informed Consent: I have reviewed the patients History and Physical, chart, labs and discussed the procedure including the risks, benefits and alternatives for the proposed anesthesia with the patient or authorized representative who has indicated his/her understanding and acceptance.       Plan Discussed with:   Anesthesia Plan Comments:         Anesthesia Quick Evaluation

## 2020-12-02 NOTE — Progress Notes (Signed)
[name] is a 35 y.o. [phone] at [redacted]w[redacted]d   Subjective: Denies pain, pressure immediately following epidural placement  Objective: BP 109/65   Pulse 79   Temp (!) 97.4 F (36.3 C) (Oral)   Resp 17   Ht 5\' 4"  (1.626 m)   Wt 65.8 kg   LMP 03/05/2020 (Approximate)   SpO2 100%   BMI 24.90 kg/m  No intake/output data recorded. No intake/output data recorded.  FHT:  FHR: 140 bpm, variability: moderate,  accelerations:  Present,  decelerations:  Present prolonged and late UC:   irregular, every 2-4 minutes SVE:   Dilation: 4.5 Effacement (%): 100 Station: 0 Exam by:: Sam W CNM  Labs: Lab Results  Component Value Date   WBC 10.1 12/02/2020   HGB 11.4 (L) 12/02/2020   HCT 34.5 (L) 12/02/2020   MCV 85.4 12/02/2020   PLT 247 12/02/2020    Assessment / Plan: --At bedside for hypotensive episode immediately after epidural placement --Rapid improvement with repositioning, fluid bolus, d/c Pitocin --Cervix now, 4.5-5/60/-1 --Dr. 06-16-1993 notified of change in status, requested at bedside --Terb administered, FSE placed without difficulty --Will reassess ability to restart Pitocin in one hour --Summary of events, current status, and plan of care reviewed with patient and her mother, who verbalize agreement  Penne Lash, CNM 12/02/2020, 12:08 PM

## 2020-12-02 NOTE — Discharge Summary (Signed)
Postpartum Discharge Summary     Patient Name: [name] DOB: May 08, 1986 MRN: [id number]  Date of admission: 12/01/2020 Delivery date:12/02/2020  Delivering provider: Darlina Rumpf  Date of discharge: 12/03/2020  Admitting diagnosis: Indication for care in labor or delivery [O75.9] Intrauterine pregnancy: [redacted]w[redacted]d    Secondary diagnosis:  Active Problems:   Indication for care in labor or delivery  Additional problems:     Discharge diagnosis: Term Pregnancy Delivered                                              Post partum procedures:none Augmentation: Pitocin Complications: None  Hospital course: Onset of Labor With Vaginal Delivery      35y.o. yo [id number] admitted in Latent Labor on 12/01/2020. Patient had an uncomplicated labor course as follows:  Membrane Rupture Time/Date: 6:30 PM ,12/01/2020   Delivery Method:Vaginal, Spontaneous  Episiotomy: None  Lacerations:  None  Patient had an uncomplicated postpartum course.  She is ambulating, tolerating a regular diet, passing flatus, and urinating well. Patient is discharged home in stable condition on 12/03/20.  Newborn Data: Birth date:12/02/2020  Birth time:4:59 PM  Gender:Female  Living status:Living  Apgars:9 ,9  Weight:3039 g   Magnesium Sulfate received: No BMZ received: No Rhophylac:N/A MMR:N/A T-DaP: Offer postpartum Flu: No Transfusion:No  Physical exam  Vitals:   12/02/20 2023 12/03/20 0035 12/03/20 0440 12/03/20 1614  BP: 127/79 137/84 118/74 105/73  Pulse: 69 65 73 71  Resp: 18 18 16 18   Temp: 98.2 F (36.8 C) 98.4 F (36.9 C) 98 F (36.7 C) 98 F (36.7 C)  TempSrc: Oral Oral Oral Oral  SpO2: 100% 100% 100% 100%  Weight:      Height:       General: alert, cooperative and no distress Lochia: appropriate Uterine Fundus: firm Incision: N/A DVT Evaluation: No evidence of DVT seen on physical exam. Negative Homan's sign. No cords or calf tenderness. No significant  calf/ankle edema. Labs: Lab Results  Component Value Date   WBC 10.1 12/02/2020   HGB 11.4 (L) 12/02/2020   HCT 34.5 (L) 12/02/2020   MCV 85.4 12/02/2020   PLT 247 12/02/2020   CMP Latest Ref Rng & Units 12/02/2020  Glucose 70 - 99 mg/dL 87  BUN 6 - 20 mg/dL <5(L)  Creatinine 0.44 - 1.00 mg/dL 0.62  Sodium 135 - 145 mmol/L 133(L)  Potassium 3.5 - 5.1 mmol/L 3.5  Chloride 98 - 111 mmol/L 104  CO2 22 - 32 mmol/L 24  Calcium 8.9 - 10.3 mg/dL 8.2(L)  Total Protein 6.5 - 8.1 g/dL 5.9(L)  Total Bilirubin 0.3 - 1.2 mg/dL 0.5  Alkaline Phos 38 - 126 U/L 142(H)  AST 15 - 41 U/L 25  ALT 0 - 44 U/L 22   Edinburgh Score: Edinburgh Postnatal Depression Scale Screening Tool 09/07/2019  I have been able to laugh and see the funny side of things. 0  I have looked forward with enjoyment to things. 0  I have blamed myself unnecessarily when things went wrong. 0  I have been anxious or worried for no good reason. 0  I have felt scared or panicky for no good reason. 0  Things have been getting on top of me. 0  I have been so unhappy that I have had difficulty sleeping. 0  I have  felt sad or miserable. 0  I have been so unhappy that I have been crying. 0  The thought of harming myself has occurred to me. 0  Edinburgh Postnatal Depression Scale Total 0     After visit meds:  Ibuprofen 600 mg every 6 hours Actaminophen 650 mg po every 4 hours  Discharge home in stable condition Infant Feeding: Bottle Infant Disposition:home with mother Discharge instruction: per After Visit Summary and Postpartum booklet. Activity: Advance as tolerated. Pelvic rest for 6 weeks.  Diet: routine diet Future Appointments:No future appointments. Follow up Visit:  [place] Follow up.   Specialty: Obstetrics and Gynecology Contact information: [address] [place] [phone]             Message sent  to [facility] by S. Jeronimo Greaves, CNM on 12/02/2020   Please schedule this patient for a In person postpartum visit in 4 weeks with the following provider: Any provider. Additional Postpartum F/U:N/A  Low risk pregnancy complicated by: Insufficient prenatal care Delivery mode:  Vaginal, Spontaneous  Anticipated Birth Control:  Depo   12/03/2020 Laury Deep, CNM

## 2020-12-02 NOTE — H&P (Addendum)
OBSTETRIC ADMISSION HISTORY AND PHYSICAL  [name] is a 35 y.o. female [phone] with IUP at [redacted]w[redacted]d by 12 week [place] presenting for SROM. Patient reports she started leaking clear fluid around 1900. Reports mild, irregular contractions. No VB. Endorses active fetal movement. She reports no blurry vision, headaches or peripheral edema, and RUQ pain. She plans on bottle feeding. She request Depo for birth control. She received her [facility] at [facility]   Dating: By 12 week [place] --->  Estimated Date of Delivery: 12/12/20  Sono:    @[redacted]w[redacted]d , CWD, normal anatomy, breech presentation, 637g, 64% EFW  Prenatal History/Complications:  - Failed medical termination - Late/limited PNC - Alpha thalassemia silent carrier - GBS unknown - History of BTL reversal   Past Medical History: Past Medical History:  Diagnosis Date  . History of reversal of tubal ligation 09/2018    Past Surgical History: Past Surgical History:  Procedure Laterality Date  . TUBAL LIGATION  2008  . Tubal reversal  09/2018    Obstetrical History: OB History    Gravida  6   Para  4   Term  4   Preterm      AB  1   Living  4     SAB  1   IAB      Ectopic      Multiple  0   Live Births  4           Social History Social History   Socioeconomic History  . Marital status: Married    Spouse name: Not on file  . Number of children: Not on file  . Years of education: Not on file  . Highest education level: Not on file  Occupational History  . Not on file  Tobacco Use  . Smoking status: Current Some Day Smoker    Packs/day: 0.25    Types: Cigarettes  . Smokeless tobacco: Never Used  . Tobacco comment: Pt states she hasn't smoked in a month  Vaping Use  . Vaping Use: Never used  Substance and Sexual Activity  . Alcohol use: No  . Drug use: Not Currently    Types: Marijuana    Comment: no use x 4wks   . Sexual activity: Yes  Other Topics Concern  . Not on file  Social History  Narrative  . Not on file   Social Determinants of Health   Financial Resource Strain: Not on file  Food Insecurity: Not on file  Transportation Needs: Not on file  Physical Activity: Not on file  Stress: Not on file  Social Connections: Not on file    Family History: Family History  Problem Relation Age of Onset  . Healthy Mother   . Healthy Father     Allergies: Allergies  Allergen Reactions  . Pollen Extract Hives    Medications Prior to Admission  Medication Sig Dispense Refill Last Dose  . cefadroxil (DURICEF) 500 MG capsule Take 1 capsule (500 mg total) by mouth 2 (two) times daily. (Patient not taking: No sig reported) 10 capsule 0   . cephALEXin (KEFLEX) 500 MG capsule Take 1 capsule (500 mg total) by mouth 4 (four) times daily. (Patient not taking: No sig reported) 28 capsule 0   . Doxylamine-Pyridoxine 10-10 MG TBEC Take by mouth. (Patient not taking: No sig reported)     . Iron, Ferrous Sulfate, 325 (65 Fe) MG TABS Take 1 tablet by mouth 2 (two) times daily. 100 tablet 3   .  metroNIDAZOLE (FLAGYL) 500 MG tablet Take 1 tablet (500 mg total) by mouth 2 (two) times daily. (Patient not taking: No sig reported) 14 tablet 0   . Prenat-Fe Poly-Methfol-FA-DHA (VITAFOL ULTRA) 29-0.6-0.4-200 MG CAPS Take 1 tablet by mouth daily. (Patient not taking: No sig reported) 30 capsule 12   . promethazine (PHENERGAN) 12.5 MG tablet Take 1 tablet (12.5 mg total) by mouth every 6 (six) hours as needed for nausea or vomiting. (Patient not taking: No sig reported) 30 tablet 0     Review of Systems   All systems reviewed and negative except as stated in HPI  Last menstrual period 03/05/2020, not currently breastfeeding. General appearance: alert and no distress Lungs: clear to auscultation bilaterally Heart: regular rate and rhythm Abdomen: soft, non-tender; bowel sounds normal Pelvic: adequate Extremities: Homans sign is negative, no sign of DVT DTR's +1 Presentation: cephalic  confirmed by BSUS Fetal monitoring: FHR 130bpm, moderate variability, +accels, no decels Uterine activity: occasional   Prenatal labs: ABO, Rh: O/Positive/-- (11/02 0946) Antibody: Negative (11/02 0946) Rubella: 5.10 (11/02 0946) RPR: Non Reactive (04/12 1044)  HBsAg: Negative (11/02 0946)  HIV: Non Reactive (04/12 1044)  GBS: unknown  2 hr Glucola: wnl Genetic screening: low risk NIPS, AFP negative Anatomy US: normal  Prenatal Transfer Tool  Maternal Diabetes: No Genetic Screening: Normal Maternal Ultrasounds/Referrals: Normal Fetal Ultrasounds or other Referrals:  None Maternal Substance Abuse:  No Significant Maternal Medications:  None Significant Maternal Lab Results: None  Results for orders placed or performed during the hospital encounter of 12/01/20 (from the past 24 hour(s))  Fern Test   Collection Time: 12/01/20 11:30 PM  Result Value Ref Range   POCT Fern Test Positive = ruptured amniotic membanes     Patient Active Problem List   Diagnosis Date Noted  . Indication for care in labor or delivery 12/01/2020  . Short interval between pregnancies affecting pregnancy in first trimester, antepartum 05/24/2020  . Alpha thalassemia silent carrier 02/12/2019  . Cervical high risk HPV (human papillomavirus) test positive 02/11/2019  . Supervision of high risk pregnancy, antepartum 01/14/2019  . Dental abscess 03/31/2017    Assessment/Plan:  [name] is a 35 y.o. [id number] at [redacted]w[redacted]d here for SROM.  #Labor: Expectant management. Plan Pitocin for augmentation prn #Pain: prn #FWB: Category 1 #ID: GBS unknown, given term and no risk factors will not treat at this time. GBS pending #MOF: Bottle #MOC: Depo #Circ: N/A # Isolated elevated BP: asymptomatic, labs pending  Brand Males, CNM  12/02/2020, 12:04 AM

## 2020-12-02 NOTE — Discharge Instructions (Signed)

## 2020-12-02 NOTE — Progress Notes (Signed)
[name] is a 35 y.o. [id number] at [redacted]w[redacted]d   Subjective: Reports "I'm about ready" to push. Denies perineal pressure but would like to assess fetal descent.  Objective: BP (!) 100/56 (BP Location: Right Arm)   Pulse 73   Temp 97.9 F (36.6 C) (Oral)   Resp 17   Ht 5\' 4"  (1.626 m)   Wt 65.8 kg   LMP 03/05/2020 (Approximate)   BMI 24.90 kg/m  No intake/output data recorded. No intake/output data recorded.  FHT:  FHR: 135 bpm, variability: moderate,  accelerations:  Present,  decelerations:  Absent UC:   irregular, every 2 minutes SVE:   Dilation: Lip/rim Effacement (%): 100 Station: 0 Exam by:: Sam W CNM  Labs: Lab Results  Component Value Date   WBC 10.1 12/02/2020   HGB 11.4 (L) 12/02/2020   HCT 34.5 (L) 12/02/2020   MCV 85.4 12/02/2020   PLT 247 12/02/2020    Assessment / Plan: --Very small, very soft anterior lip. Patient unmedicated and poorly tolerant of exam. Patient without urge to push, no early decels on EFM. Discussed abbreviated exam is not c/w patient presentation. Pt previously considered epidural. Low suspicion of imminent delivery. RN to notify anesthesia of patient's request for epidural. Will reassess cervix when comfortable with epidural   12/04/2020, CNM

## 2020-12-02 NOTE — Anesthesia Procedure Notes (Signed)
Epidural Patient location during procedure: OB Start time: 12/02/2020 10:43 AM End time: 12/02/2020 10:48 AM  Staffing Anesthesiologist: Marcene Duos, MD Performed: anesthesiologist   Preanesthetic Checklist Completed: patient identified, IV checked, site marked, risks and benefits discussed, surgical consent, monitors and equipment checked, pre-op evaluation and timeout performed  Epidural Patient position: sitting Prep: DuraPrep and site prepped and draped Patient monitoring: continuous pulse ox and blood pressure Approach: midline Location: L4-L5 Injection technique: LOR air  Needle:  Needle type: Tuohy  Needle gauge: 17 G Needle length: 9 cm and 9 Needle insertion depth: 5 cm cm Catheter type: closed end flexible Catheter size: 19 Gauge Catheter at skin depth: 10 cm Test dose: negative  Assessment Events: blood not aspirated, injection not painful, no injection resistance, no paresthesia and negative IV test

## 2020-12-02 NOTE — Progress Notes (Signed)
[name] is a 35 y.o. [id number] at [redacted]w[redacted]d   Subjective: Endorses intermittently painful contraction. Denies urge to push  Objective: BP (!) 100/56 (BP Location: Right Arm)   Pulse 73   Temp 97.9 F (36.6 C) (Oral)   Resp 17   Ht 5\' 4"  (1.626 m)   Wt 65.8 kg   LMP 03/05/2020 (Approximate)   BMI 24.90 kg/m  No intake/output data recorded. No intake/output data recorded.  FHT:  FHR: 135 bpm, variability: moderate,  accelerations:  Present,  decelerations:  Absent UC:   irregular, every 2-6 minutes SVE:   Dilation: Lip/rim Effacement (%): 100 Station: 0 Exam by:: Sam W CNM  Labs: Lab Results  Component Value Date   WBC 10.1 12/02/2020   HGB 11.4 (L) 12/02/2020   HCT 34.5 (L) 12/02/2020   MCV 85.4 12/02/2020   PLT 247 12/02/2020    Assessment / Plan: --Requested at bedside by RN, C/C/+2 per RN exam --Unmedicated labor, 9.5-10 per my exam --Cat I tracing --Pitocin infusing at 2 mu, increased to 6mu while CNM at bedside --Patient without urge to push, not reporting intense contractions. Discussed practice push, visible bulging of perineum but patient reporting feeling anxious and "not ready". Continues to strongly consider epidural. Will labor down, may have epidural on request --Anticipate NSVD  11m, CNM

## 2020-12-02 NOTE — Progress Notes (Signed)
[name] is a 35 y.o. [id number] at [redacted]w[redacted]d admitted for SROM.  Subjective: [name] is doing well. Reports some cramping and intermittent pelvic pressure.   Objective: BP 105/70   Pulse 77   Temp 98.1 F (36.7 C)   Resp 18   Ht 5\' 4"  (1.626 m)   Wt 65.8 kg   LMP 03/05/2020 (Approximate)   BMI 24.90 kg/m  No intake/output data recorded. No intake/output data recorded.  FHT: 135 bpm, moderate variability, +accels, no decels UC: occasional SVE:   Dilation: 4 Effacement (%): 50 Station: -1 Exam by:: [email or web address], RN  Labs: Lab Results  Component Value Date   WBC 10.1 12/02/2020   HGB 11.4 (L) 12/02/2020   HCT 34.5 (L) 12/02/2020   MCV 85.4 12/02/2020   PLT 247 12/02/2020    Assessment / Plan: [name] 35 y.o. [name] at [redacted]w[redacted]d admitted for SROM  Labor: Cervical exam significantly different from RN exam. Pitocin was ordered at 0340, however RN reported not started due to staffing and high acuity patients. Day shift RN told to start Pitocin now.  Preeclampsia: 1 isolated elevated BP, labs wnl, pt asymptomatic, continue to monitor BP's Fetal Wellbeing:  Category I Pain Control:  IV pain medication or epidural prn I/D: GBS negative Anticipated MOD: Anticipate SVD  [redacted]w[redacted]d, CNM 12/02/2020, 7:54 AM

## 2020-12-03 MED ORDER — OXYCODONE HCL 5 MG PO TABS: 5.0000 mg | ORAL_TABLET | Freq: Once | ORAL | Status: DC

## 2020-12-03 MED ORDER — ACETAMINOPHEN 325 MG PO TABS: 650.0000 mg | ORAL_TABLET | ORAL | 0 refills | Status: DC | PRN

## 2020-12-03 MED ORDER — IBUPROFEN 600 MG PO TABS: 600.0000 mg | ORAL_TABLET | Freq: Four times a day (QID) | ORAL | 0 refills | Status: DC

## 2020-12-03 MED ORDER — ONDANSETRON 4 MG PO TBDP
8.0000 mg | ORAL_TABLET | Freq: Once | ORAL | Status: AC
  Administered 2020-12-03: 8 mg via ORAL
  Filled 2020-12-03: qty 2

## 2020-12-03 NOTE — Progress Notes (Addendum)
Post Partum Day 1 Subjective: [name]  is a 35 y.o. [id number] s/p SVD at [redacted]w[redacted]d.  She reports she is doing well. No acute events overnight. She denies any problems with ambulating, voiding or po intake. Endorses "lots" of nausea, but no vomiting.  Pain is not well controlled on acetaminophen or ibuprofen.  Lochia is moderate and improving. Desires early discharge after 1700 today; despite feeling pain and nausea now.  Objective: Blood pressure 118/74, pulse 73, temperature 98 F (36.7 C), temperature source Oral, resp. rate 16, height 5\' 4"  (1.626 m), weight 65.8 kg, last menstrual period 03/05/2020, SpO2 100 %, currently breastfeeding.  Physical Exam:  General: alert, cooperative and mild distress Lochia: appropriate Uterine Fundus: firm, midline@U  Incision: n/a DVT Evaluation: No evidence of DVT seen on physical exam. Negative Homan's sign. No cords or calf tenderness. No significant calf/ankle edema.  Recent Labs    12/02/20 0000  HGB 11.4*  HCT 34.5*    Assessment/Plan: Oxycodone IR 5 mg po now x 1 dose only Give Zofran 8 mg ODT prior to Oxycodone administration Discharge home after 1700, if appropriate, Breastfeeding, Lactation consult, Social Work consult and Contraception Depo Provera prior to discharge home   LOS: 2 days   12/04/20, CNM 12/03/2020, 9:11 AM

## 2020-12-03 NOTE — Anesthesia Postprocedure Evaluation (Signed)
Anesthesia Post Note  Patient: [name]  Procedure(s) Performed: AN AD HOC LABOR EPIDURAL     Patient location during evaluation: Mother Baby Anesthesia Type: Epidural Level of consciousness: awake and alert, oriented and patient cooperative Pain management: pain level controlled Vital Signs Assessment: post-procedure vital signs reviewed and stable Respiratory status: spontaneous breathing Cardiovascular status: stable Postop Assessment: no headache, epidural receding, patient able to bend at knees and no signs of nausea or vomiting Anesthetic complications: no Comments: Pt. States she is walking. Pt reports pain score 8.  Pt. Encouraged to communicate pain needs to bedside RN.    No complications documented.  Last Vitals:  Vitals:   12/03/20 0035 12/03/20 0440  BP: 137/84 118/74  Pulse: 65 73  Resp: 18 16  Temp: 36.9 C 36.7 C  SpO2: 100% 100%    Last Pain:  Vitals:   12/03/20 0441  TempSrc:   PainSc: 7    Pain Goal:                   [facility]

## 2020-12-03 NOTE — Clinical Social Work Maternal (Signed)
CLINICAL SOCIAL WORK MATERNAL/CHILD NOTE  Patient Details  Name: [name] MRN: [id number] Date of Birth: [birth year]  Date:  12/03/2020  Clinical Social Worker Initiating Note:  Darcus Austin, MSW, LCSWA Date/Time: Initiated:  12/03/20/1207     Child's Name:  Theresia Bough   Biological Parents:  Mother,Father (Nichole Sabra Heck)   Need for Interpreter:  None   Reason for Referral:  Current Substance Use/Substance Use During Pregnancy ,Other (Comment) (Limited prenatal care)   Address:  [place] [address]    Phone number:  [phone] (home)     Additional phone number:   Household Members/Support Persons (HM/SP):   Household Member/Support Person 1,Household Member/Support Person 2,Household Member/Support Person 3,Household Member/Support Person 4   HM/SP Name Relationship DOB or Age  HM/SP -1 [name] Spouse 02/13/1985  HM/SP -2 [name] Son 05/16/03  HM/SP -3 [name] Son 04/05/07  HM/SP -4 [name] Son 08/09/18  HM/SP -5        HM/SP -6        HM/SP -7        HM/SP -8          Natural Supports (not living in the home):  Other (Comment) (Per MOB, self support only.)   Professional Supports:     Employment: Self-employed   Type of Work:     Education:  Person arranged:    Museum/gallery curator Resources:  [employer]   Other Resources:  Physicist, medical ,[place] Considerations Which May Impact Care:    Strengths:  Pediatrician chosen,Home prepared for child ,Ability to meet basic needs    Psychotropic Medications:         Pediatrician:    [place]  Pediatrician List:   [place]  [place]      Pediatrician Fax Number:    Risk Factors/Current Problems:  Substance Use    Cognitive State:  Able to Concentrate ,Alert ,Linear Thinking ,Poor Insight ,Poor Judgement     Mood/Affect:  Agitated ,Angry ,Irritable ,Tearful ,Overwhelmed    CSW Assessment: CSW met with MOB to complete assessment for limited [facility] and substance use during pregnancy. CSW observed MOB standing at bedside while MGM was holding infant on couch. MOB gave CSW verbal consent to complete assessment while MGM was present. CSW explained role and reason for consult. MOB was agitated, irritated and overwhelmed during engagement with CSW. MOB reported, her last use of THC was about sixty days ago. Therefore, she does not understand how her UDS was positive for THC. MOB reported, her reason for [facility] use was, because she was nausea during entire pregnancy. Due to MOB's inappropriate behavior, CSW was not able to receive clear reason behind MOB's limited PNC.   CSW informed MOB of Drug Screen Policy and MOB was understanding, but agitated of protocol. CSW made CPS report to [place] via [facility]. CSW will continue to follow the CDS and will update CPS for any additional information. MOB denied any substance or CPS involvement.   CSW provided education regarding the baby blues period vs. perinatal mood disorders, discussed treatment and gave resources for mental health follow up if concerns arise. CSW recommends self- evaluation during the postpartum time period using the New Mom Checklist from Postpartum Progress and encouraged MOB to contact a medical professional if symptoms are noted  at any time.   When CSW asked MOB about her emotions after delivery. MOB reported, she is feeling agitated, angry, and ready to go home.  MOB reported, she does not have any support. MOB denied SI, HI and DV when CSW assessed for safety.   MOB reported, she receive WIC and FS. CSW informed MOB about adding infant to [facility] and FS. MOB reported, infant's pediatrician will be at [facility] and there are no barriers to follow up care. MOB reported, she has all essentials needed to care for infant. MOB  reported, infant has a new car seat, bassinet, and crib. MOB denied any additional barriers.     CSW provided education on Sudden Infant Death Syndrome (SIDS).    CSW will continue to follow the CDS and will update CPS for any additional information.   CSW Plan/Description:  No Further Intervention Required/No Barriers to Discharge,Perinatal Mood and Anxiety Disorder (PMADs) Education,Sudden Infant Death Syndrome (SIDS) Education,Hospital Drug Screen Policy Information,CSW Will Continue to Monitor Umbilical Cord Tissue Drug Screen Results and Make Report if [facility] Protective Service Report     Collene Gobble 12/03/2020, 12:25 PM

## 2020-12-30 ENCOUNTER — Ambulatory Visit: Payer: Medicaid Other | Admitting: Family Medicine

## 2021-02-14 ENCOUNTER — Other Ambulatory Visit: Payer: Self-pay

## 2021-02-14 DIAGNOSIS — Z309 Encounter for contraceptive management, unspecified: Secondary | ICD-10-CM

## 2021-02-14 MED ORDER — MEDROXYPROGESTERONE ACETATE 150 MG/ML IM SUSP: 150.0000 mg | INTRAMUSCULAR | 3 refills | Status: DC

## 2021-02-21 ENCOUNTER — Other Ambulatory Visit: Payer: Self-pay

## 2021-02-21 ENCOUNTER — Ambulatory Visit (INDEPENDENT_AMBULATORY_CARE_PROVIDER_SITE_OTHER): Payer: Medicaid Other | Admitting: *Deleted

## 2021-02-21 DIAGNOSIS — Z3042 Encounter for surveillance of injectable contraceptive: Secondary | ICD-10-CM

## 2021-02-21 MED ORDER — MEDROXYPROGESTERONE ACETATE 150 MG/ML IM SUSP
150.0000 mg | Freq: Once | INTRAMUSCULAR | Status: AC
  Administered 2021-02-21: 150 mg via INTRAMUSCULAR

## 2021-02-21 NOTE — Progress Notes (Signed)
Date last pap: 01/30/19, abnormal, encouraged to schedule annual exam. Last Depo-Provera: 12/03/20. Side Effects if any: NA. Serum HCG indicated? NA. Depo-Provera 150 mg IM given by: Montez Morita, RNC. Next appointment due 05/09/21-05/23/21.

## 2021-05-15 ENCOUNTER — Ambulatory Visit: Payer: Medicaid Other

## 2021-05-16 ENCOUNTER — Other Ambulatory Visit: Payer: Self-pay

## 2021-05-16 ENCOUNTER — Ambulatory Visit (INDEPENDENT_AMBULATORY_CARE_PROVIDER_SITE_OTHER): Payer: Medicaid Other | Admitting: *Deleted

## 2021-05-16 VITALS — BP 101/69 | HR 80

## 2021-05-16 DIAGNOSIS — Z3042 Encounter for surveillance of injectable contraceptive: Secondary | ICD-10-CM

## 2021-05-16 MED ORDER — MEDROXYPROGESTERONE ACETATE 150 MG/ML IM SUSP
150.0000 mg | Freq: Once | INTRAMUSCULAR | Status: AC
  Administered 2021-05-16: 150 mg via INTRAMUSCULAR

## 2021-05-16 NOTE — Progress Notes (Signed)
Date last pap: 01/30/19, abnormal, advised to schedule annual. Last Depo-Provera: 02/21/21. Side Effects if any: NA. Serum HCG indicated? NA. Depo-Provera 150 mg IM given by: Montez Morita, RNC  IM in Right Deltoid. Next appointment due 08/01/21-08/15/21.

## 2021-05-16 NOTE — Progress Notes (Signed)
Patient forgot to pick up depo. Will return later today.

## 2021-08-07 ENCOUNTER — Ambulatory Visit: Payer: Medicaid Other

## 2021-08-09 ENCOUNTER — Ambulatory Visit: Payer: Medicaid Other

## 2021-08-14 ENCOUNTER — Ambulatory Visit: Payer: Medicaid Other

## 2021-08-16 ENCOUNTER — Other Ambulatory Visit: Payer: Self-pay

## 2021-08-16 ENCOUNTER — Ambulatory Visit (INDEPENDENT_AMBULATORY_CARE_PROVIDER_SITE_OTHER): Payer: Medicaid Other

## 2021-08-16 DIAGNOSIS — Z3042 Encounter for surveillance of injectable contraceptive: Secondary | ICD-10-CM | POA: Diagnosis not present

## 2021-08-16 MED ORDER — MEDROXYPROGESTERONE ACETATE 150 MG/ML IM SUSP
150.0000 mg | Freq: Once | INTRAMUSCULAR | Status: AC
  Administered 2021-08-16: 15:00:00 150 mg via INTRAMUSCULAR

## 2021-08-16 NOTE — Progress Notes (Signed)
Patient was assessed and managed by nursing staff during this encounter. I have reviewed the chart and agree with the documentation and plan. I have also made any necessary editorial changes.  Warden Fillers, MD 08/16/2021 4:30 PM

## 2021-08-16 NOTE — Progress Notes (Signed)
Patient presents for a depo injection. Patient is within her window. Inj given in RD. Patient tolerate well. Next depo due 4/12-4/26.

## 2021-09-26 ENCOUNTER — Telehealth (INDEPENDENT_AMBULATORY_CARE_PROVIDER_SITE_OTHER): Payer: Medicaid Other | Admitting: Obstetrics

## 2021-09-26 ENCOUNTER — Encounter: Payer: Self-pay | Admitting: Obstetrics

## 2021-09-26 DIAGNOSIS — F17210 Nicotine dependence, cigarettes, uncomplicated: Secondary | ICD-10-CM

## 2021-09-26 DIAGNOSIS — Z3042 Encounter for surveillance of injectable contraceptive: Secondary | ICD-10-CM

## 2021-09-26 NOTE — Progress Notes (Signed)
? ?  TELEHEALTH GYNECOLOGY VISIT ENCOUNTER NOTE ? ?Provider location: Center for [employer] at [place]  ? ?Patient location: Home ? ?I connected with [name] on 09/26/21 at  9:55 AM EST by telephone and verified that I am speaking with the correct person using two identifiers. Patient was unable to do MyChart audiovisual encounter due to technical difficulties, she tried several times.  ?  ?I discussed the limitations, risks, security and privacy concerns of performing an evaluation and management service by telephone and the availability of in person appointments. I also discussed with the patient that there may be a patient responsible charge related to this service. The patient expressed understanding and agreed to proceed. ?  ?History:  ?[name] is a 36 y.o. [phone] female being evaluated today for contraceptive surveillance.. She is currently getting Depo Provera injections.  Last Depo injection was 08-16-2021.  She states that she wants to switch to the pill.  I informed her that she is greater than 90 years of age and smokes cigarettes, therefore OCP's would be contraindicated, with and increased risk for blood clots and stroke.  She denies any abnormal vaginal discharge, bleeding, pelvic pain or other concerns.   ?  ?  ?Past Medical History:  ?Diagnosis Date  ? History of reversal of tubal ligation 09/2018  ? ?Past Surgical History:  ?Procedure Laterality Date  ? TUBAL LIGATION  2008  ? Tubal reversal  09/2018  ? ?The following portions of the patient's history were reviewed and updated as appropriate: allergies, current medications, past family history, past medical history, past social history, past surgical history and problem list.  ? ?Health Maintenance:  Normal pap and negative HRHPV on 01-30-2019.   ? ?Review of Systems:  ?Pertinent items noted in HPI and remainder of comprehensive ROS otherwise negative. ? ?Physical Exam:  ? ?General:  Alert, oriented and cooperative.   ?Mental  Status: Normal mood and affect perceived. Normal judgment and thought content.  ?Physical exam deferred due to nature of the encounter ? ?Labs and Imaging ?No results found for this or any previous visit (from the past 336 hour(s)). ?No results found.    ?Assessment and Plan:  ?     ? 1. Encounter for surveillance of injectable contraceptive ?- currently receiving Depo Provera injections, but wants to switch to OCP's.  Last Depo injection 08-16-2021 ? ?2. Tobacco dependence due to cigarettes ?- discussed OCP's being contraindicated at age 2 or greater and smoking cigarettes, with an increased risk of blood clots and stroke ?- recommended an appointment in the office for an Annual / Pap, and further discussion of contraceptio  Next Depo due 11-01-2021. ? ? ?I discussed the assessment and treatment plan with the patient. The patient was provided an opportunity to ask questions and all were answered. The patient agreed with the plan and demonstrated an understanding of the instructions. ?  ?The patient was advised to call back or seek an in-person evaluation/go to the ED if the symptoms worsen or if the condition fails to improve as anticipated. ? ?I have spent a total of 15 minutes of face-to-face and non-face-to-face time, excluding clinical staff time, reviewing notes and preparing to see patient, ordering tests and/or medications, and counseling the patient.  ? ? ?Baltazar Najjar, MD ?[facility] for [place], [place] ?09/26/21  ?

## 2021-10-10 ENCOUNTER — Other Ambulatory Visit: Payer: Self-pay

## 2021-10-10 ENCOUNTER — Encounter: Payer: Self-pay | Admitting: Obstetrics and Gynecology

## 2021-10-10 ENCOUNTER — Ambulatory Visit (INDEPENDENT_AMBULATORY_CARE_PROVIDER_SITE_OTHER): Payer: Medicaid Other | Admitting: Obstetrics and Gynecology

## 2021-10-10 ENCOUNTER — Other Ambulatory Visit (HOSPITAL_COMMUNITY)
Admission: RE | Admit: 2021-10-10 | Discharge: 2021-10-10 | Disposition: A | Discharge status: Home / Self Care | Payer: Medicaid Other | Source: Ambulatory Visit | Attending: Obstetrics and Gynecology | Admitting: Obstetrics and Gynecology

## 2021-10-10 VITALS — BP 105/71 | HR 92 | Ht 64.0 in | Wt 117.4 lb

## 2021-10-10 DIAGNOSIS — N921 Excessive and frequent menstruation with irregular cycle: Secondary | ICD-10-CM | POA: Diagnosis not present

## 2021-10-10 DIAGNOSIS — R634 Abnormal weight loss: Secondary | ICD-10-CM | POA: Diagnosis not present

## 2021-10-10 DIAGNOSIS — Z01419 Encounter for gynecological examination (general) (routine) without abnormal findings: Secondary | ICD-10-CM | POA: Diagnosis not present

## 2021-10-10 MED ORDER — TRANEXAMIC ACID 650 MG PO TABS: 1300.0000 mg | ORAL_TABLET | Freq: Three times a day (TID) | ORAL | 6 refills | Status: DC

## 2021-10-10 NOTE — Progress Notes (Signed)
? ? ?GYNECOLOGY ANNUAL PREVENTATIVE CARE ENCOUNTER NOTE ? ?History:    ? [name] is a 36 y.o. [phone] female here for a routine annual gynecologic exam.  Current complaints: unexplained weight loss and irregular bleeding due to depo provera.   Denies abnormal vaginal bleeding, discharge, pelvic pain, problems with intercourse or other gynecologic concerns.  ?  ?Gynecologic History ?Patient's last menstrual period was 10/09/2021. ?Contraception: Depo-Provera injections ?Last Pap: 01/2019. Results were: normal with negative HPV ? ? ?Obstetric History ?OB History  ?Gravida Para Term Preterm AB Living  ?6 5 5   1 5   ?SAB IAB Ectopic Multiple Live Births  ?1     0 5  ?  ?# Outcome Date GA Lbr Len/2nd Weight Sex Delivery Anes PTL Lv  ?6 Term 12/02/20 [redacted]w[redacted]d 22:20 / 00:09 6 lb 11.2 oz (3.039 kg) F Vag-Spont EPI  LIV  ?5 Term 08/10/19 [redacted]w[redacted]d / 00:31 7 lb 7.9 oz (3.4 kg) M Vag-Spont None  LIV  ?4 Term 2008     Vag-Spont   LIV  ?3 SAB 2005 [redacted]w[redacted]d         ?2 Term 2004     Vag-Spont   LIV  ?1 Term 2002     Vag-Spont   LIV  ? ? ?Past Medical History:  ?Diagnosis Date  ? History of reversal of tubal ligation 09/2018  ? ? ?Past Surgical History:  ?Procedure Laterality Date  ? TUBAL LIGATION  2008  ? Tubal reversal  09/2018  ? ? ?Current Outpatient Medications on File Prior to Visit  ?Medication Sig Dispense Refill  ? acetaminophen (TYLENOL) 325 MG tablet Take 2 tablets (650 mg total) by mouth every 4 (four) hours as needed (for pain scale < 4). (Patient not taking: Reported on 10/10/2021) 30 tablet 0  ? ibuprofen (ADVIL) 600 MG tablet Take 1 tablet (600 mg total) by mouth every 6 (six) hours. (Patient not taking: Reported on 10/10/2021) 30 tablet 0  ? Iron, Ferrous Sulfate, 325 (65 Fe) MG TABS Take 1 tablet by mouth 2 (two) times daily. 100 tablet 3  ? Prenat-Fe Poly-Methfol-FA-DHA (VITAFOL ULTRA) 29-0.6-0.4-200 MG CAPS Take 1 tablet by mouth daily. (Patient not taking: No sig reported) 30 capsule 12  ? [DISCONTINUED]  hydrochlorothiazide (HYDRODIURIL) 25 MG tablet Take 1 tablet (25 mg total) by mouth daily. 30 tablet 0  ? ?No current facility-administered medications on file prior to visit.  ? ? ?Allergies  ?Allergen Reactions  ? Pollen Extract Hives  ? ? ?Social History:  reports that she has been smoking cigarettes. She has been smoking an average of .25 packs per day. She has never used smokeless tobacco. She reports that she does not currently use drugs after having used the following drugs: Marijuana. She reports that she does not drink alcohol. ? ?Family History  ?Problem Relation Age of Onset  ? Healthy Mother   ? Healthy Father   ? ? ?The following portions of the patient's history were reviewed and updated as appropriate: allergies, current medications, past family history, past medical history, past social history, past surgical history and problem list. ? ?Review of Systems ?Pertinent items noted in HPI and remainder of comprehensive ROS otherwise negative. ? ?Physical Exam:  ?BP 105/71   Pulse 92   Ht 5\' 4"  (1.626 m)   Wt 117 lb 6.4 oz (53.3 kg)   LMP 10/09/2021 Comment: Bleeding for almost one month, only a few days of spotting then back on cycle  BMI 20.15 kg/m?  ?  CONSTITUTIONAL: Well-developed, well-nourished female in no acute distress.  ?HENT:  Normocephalic, atraumatic, External right and left ear normal. Oropharynx is clear and moist ?EYES: Conjunctivae and EOM are normal.  ?NECK: Normal range of motion, supple, no masses.  Normal thyroid.  ?SKIN: Skin is warm and dry. No rash noted. Not diaphoretic. No erythema. No pallor. ?MUSCULOSKELETAL: Normal range of motion. No tenderness.  No cyanosis, clubbing, or edema.  2+ distal pulses. ?NEUROLOGIC: Alert and oriented to person, place, and time. Normal reflexes, muscle tone coordination.  ?PSYCHIATRIC: Normal mood and affect. Slightly anxious.  Normal behavior. Normal judgment and thought content. ?CARDIOVASCULAR: Normal heart rate noted, regular  rhythm ?RESPIRATORY: Clear to auscultation bilaterally. Effort and breath sounds normal, no problems with respiration noted. ?BREASTS: deferred ?ABDOMEN: Soft, no distention noted.  No tenderness, rebound or guarding.  ?PELVIC: Normal appearing external genitalia and urethral meatus; normal appearing vaginal mucosa and cervix.  No abnormal discharge noted.  Pap smear obtained.  Normal uterine size, no other palpable masses, no uterine or adnexal tenderness.  Performed in the presence of a chaperone. ?  ?Assessment and Plan:  ?  1. Women's annual  gynecological examination ?Normal annual exam ?Pap taken ?- Cytology - PAP ? ?2. Recent unexplained weight loss ?Pt has had 17 pound weight loss over the last 1-2 months.  Pt has tried boost/ensure with little improvement.   ?Will check thyroid  for hyperthyroid. ?Pt declines referral to dietician ?Pt believes her weight loss may be a reflection of her stress and anxiety, will refer to behavorial health. ? ?- Thyroid Panel With TSH ?- Ambulatory referral to Integrated Behavioral Health ? ?3. Breakthrough bleeding on Depo-Provera ?Pt still smokes 1/2-1 ppd tobacco.  Reviewed literature regarding breakthrough bleeding on depo provera.  Most recommend some form of estrogen which she cannot receive due to her continued smoking.  Will try lysteda as a non hormonal treatment to see if the spotting can be controlled /diminished. ? ?- tranexamic acid (LYSTEDA) 650 MG TABS tablet; Take 2 tablets (1,300 mg total) by mouth 3 (three) times daily.  Dispense: 30 tablet; Refill: 6 ? ?Will follow up results of pap smear and manage accordingly. ?Routine preventative health maintenance measures emphasized. ?Please refer to After Visit Summary for other counseling recommendations.  ?   ?F/u in 2 months with virtual visit to see if bleeding has improved. ? ?Mariel Aloe, MD, FACOG ?Obstetrician Heritage manager, Faculty Practice ?Center for [employer], [facility]  ?

## 2021-10-10 NOTE — Progress Notes (Signed)
GYN/ Annual ?14 % weight loss without trying/ reports moderated to severe depressive symptoms, no appetite PHQ 9=15 GAD 7=6. Declines [facility] ref at this time. ? ?Last Pap 01/2019, HR HPV ? ?Wants to switch from Depo ? ?

## 2021-10-11 ENCOUNTER — Other Ambulatory Visit (HOSPITAL_COMMUNITY)
Admission: RE | Admit: 2021-10-11 | Discharge: 2021-10-11 | Disposition: A | Discharge status: Home / Self Care | Payer: Medicaid Other | Source: Ambulatory Visit | Attending: Obstetrics and Gynecology | Admitting: Obstetrics and Gynecology

## 2021-10-11 DIAGNOSIS — Z01419 Encounter for gynecological examination (general) (routine) without abnormal findings: Secondary | ICD-10-CM | POA: Insufficient documentation

## 2021-10-11 LAB — THYROID PANEL WITH TSH
Free Thyroxine Index: 1.6 (ref 1.2–4.9)
T3 Uptake Ratio: 23 % — ABNORMAL LOW (ref 24–39)
T4, Total: 6.8 ug/dL (ref 4.5–12.0)
TSH: 0.892 u[IU]/mL (ref 0.450–4.500)

## 2021-10-11 NOTE — Addendum Note (Signed)
Addended by: Penny Pia on: 10/11/2021 11:36 AM ? ? Modules accepted: Orders ? ?

## 2021-10-12 LAB — CERVICOVAGINAL ANCILLARY ONLY
Bacterial Vaginitis (gardnerella): POSITIVE — AB
Candida Glabrata: NEGATIVE
Candida Vaginitis: NEGATIVE
Chlamydia: NEGATIVE
Comment: NEGATIVE
Comment: NEGATIVE
Comment: NEGATIVE
Comment: NEGATIVE
Comment: NEGATIVE
Comment: NORMAL
Neisseria Gonorrhea: NEGATIVE
Trichomonas: NEGATIVE

## 2021-10-16 ENCOUNTER — Other Ambulatory Visit: Payer: Self-pay

## 2021-10-16 DIAGNOSIS — B9689 Other specified bacterial agents as the cause of diseases classified elsewhere: Secondary | ICD-10-CM

## 2021-10-16 LAB — CYTOLOGY - PAP
Comment: NEGATIVE
Comment: NEGATIVE
Diagnosis: UNDETERMINED — AB
HPV 16: NEGATIVE
HPV 18 / 45: NEGATIVE
High risk HPV: POSITIVE — AB

## 2021-10-16 MED ORDER — METRONIDAZOLE 500 MG PO TABS: 500.0000 mg | ORAL_TABLET | Freq: Two times a day (BID) | ORAL | 0 refills | Status: DC

## 2021-10-31 ENCOUNTER — Telehealth: Payer: Self-pay | Admitting: *Deleted

## 2021-10-31 NOTE — Telephone Encounter (Signed)
Pt called to office asking about Dietician and how the process works.  Pt made aware a referral can be offered and ordered, pt declines today. ?Pt also asked about other BC methods due to bleeding with Depo. Pt made aware to follow  provider recommendations. Complete Rx for bleeding and monitor over the next few months.  Pt advised to call if bleeding continues or worsens.  ? ?

## 2021-11-01 ENCOUNTER — Telehealth: Payer: Self-pay | Admitting: *Deleted

## 2021-11-01 NOTE — Telephone Encounter (Signed)
Spoke with pt stating she has a boil under her arm that is very painful.  Pt would like to know if we can help.  Pt states she has been seen at urgent care for this problem before.  ?Advised pt she may be seen at urgent care or possibly call them.  Otherwise, she would have to have an appt.  ?

## 2021-11-06 ENCOUNTER — Ambulatory Visit: Payer: Medicaid Other

## 2021-11-27 ENCOUNTER — Ambulatory Visit (INDEPENDENT_AMBULATORY_CARE_PROVIDER_SITE_OTHER): Payer: Medicaid Other

## 2021-11-27 VITALS — BP 96/63 | HR 89 | Ht 64.0 in | Wt 119.0 lb

## 2021-11-27 DIAGNOSIS — Z3042 Encounter for surveillance of injectable contraceptive: Secondary | ICD-10-CM

## 2021-11-27 MED ORDER — MEDROXYPROGESTERONE ACETATE 150 MG/ML IM SUSP
150.0000 mg | Freq: Once | INTRAMUSCULAR | Status: AC
  Administered 2021-11-27: 150 mg via INTRAMUSCULAR

## 2021-11-27 NOTE — Progress Notes (Signed)
Agree with nurses's documentation of this patient's clinic encounter.  Jakaila Norment L, MD  

## 2021-11-27 NOTE — Progress Notes (Addendum)
SUBJECTIVE: [name] is a 36 y.o. female who presents for DEPO Injection.  ? ?OBJECTIVE: Appears well, in no apparent distress.  Vital signs are normal.  ? ? ?ASSESSMENT: Need for BC.  Last PAP 10/10/2021 ? ?PLAN: DEPO given in LD, tolerated well.  ?Next DEPO due July 24-February 26, 2022  ? ?Administrations This Visit   ? ? medroxyPROGESTERone (DEPO-PROVERA) injection 150 mg   ? ? Admin Date ?11/27/2021 Action ?Given Dose ?150 mg Route ?Intramuscular Administered By ?Maretta Bees, RMA  ? ?  ?  ? ?  ?  ?

## 2021-11-29 ENCOUNTER — Encounter: Payer: Medicaid Other | Admitting: Obstetrics & Gynecology

## 2021-12-13 ENCOUNTER — Ambulatory Visit: Payer: Medicaid Other | Admitting: Obstetrics and Gynecology

## 2022-02-23 ENCOUNTER — Ambulatory Visit: Payer: Medicaid Other

## 2022-02-27 ENCOUNTER — Ambulatory Visit: Payer: Medicaid Other

## 2022-03-01 ENCOUNTER — Ambulatory Visit (INDEPENDENT_AMBULATORY_CARE_PROVIDER_SITE_OTHER): Payer: Medicaid Other

## 2022-03-01 ENCOUNTER — Other Ambulatory Visit: Payer: Self-pay | Admitting: Obstetrics

## 2022-03-01 DIAGNOSIS — Z3042 Encounter for surveillance of injectable contraceptive: Secondary | ICD-10-CM | POA: Diagnosis not present

## 2022-03-01 DIAGNOSIS — Z309 Encounter for contraceptive management, unspecified: Secondary | ICD-10-CM

## 2022-03-01 LAB — POCT URINE PREGNANCY: Preg Test, Ur: NEGATIVE

## 2022-03-01 MED ORDER — MEDROXYPROGESTERONE ACETATE 150 MG/ML IM SUSP
150.0000 mg | INTRAMUSCULAR | Status: DC
  Administered 2022-03-01: 150 mg via INTRAMUSCULAR

## 2022-03-01 NOTE — Progress Notes (Signed)
Pt is in the office for depo Restart, UPT in office today is negative. Depo administered in office today in R Del and pt tolerated well. Next due Oct 26-Nov 9th.  .. Administrations This Visit     medroxyPROGESTERone (DEPO-PROVERA) injection 150 mg     Admin Date 03/01/2022 Action Given Dose 150 mg Route Intramuscular Administered By Katrina Stack, RN

## 2022-06-04 ENCOUNTER — Ambulatory Visit: Payer: Medicaid Other

## 2022-06-05 ENCOUNTER — Ambulatory Visit (INDEPENDENT_AMBULATORY_CARE_PROVIDER_SITE_OTHER): Payer: Medicaid Other | Admitting: General Practice

## 2022-06-05 VITALS — BP 93/58 | HR 72 | Ht 64.0 in | Wt 129.5 lb

## 2022-06-05 DIAGNOSIS — Z3042 Encounter for surveillance of injectable contraceptive: Secondary | ICD-10-CM

## 2022-06-05 MED ORDER — MEDROXYPROGESTERONE ACETATE 150 MG/ML IM SUSP
150.0000 mg | Freq: Once | INTRAMUSCULAR | Status: AC
  Administered 2022-06-05: 150 mg via INTRAMUSCULAR

## 2022-06-05 NOTE — Progress Notes (Signed)
Patient was assessed and managed by nursing staff during this encounter. I have reviewed the chart and agree with the documentation and plan. I have also made any necessary editorial changes.  Idamay Hosein A Carmelo Reidel, MD 06/05/2022 3:32 PM   

## 2022-06-05 NOTE — Progress Notes (Signed)
Date last pap: 10-10-21. Last Depo-Provera: 03-01-22. Side Effects if any: Pt tolerated well. Serum HCG indicated? Depo given on schedule. Depo-Provera 150 mg IM given by: Hope Pigeon, CMA in the right deltoid per pt request. Next appointment due 1/30-2/13.

## 2022-08-27 ENCOUNTER — Ambulatory Visit: Payer: Medicaid Other

## 2022-09-03 ENCOUNTER — Telehealth (INDEPENDENT_AMBULATORY_CARE_PROVIDER_SITE_OTHER): Payer: Medicaid Other | Admitting: Obstetrics and Gynecology

## 2022-09-03 ENCOUNTER — Encounter: Payer: Self-pay | Admitting: Obstetrics and Gynecology

## 2022-09-03 DIAGNOSIS — Z3009 Encounter for other general counseling and advice on contraception: Secondary | ICD-10-CM

## 2022-09-03 DIAGNOSIS — Z30011 Encounter for initial prescription of contraceptive pills: Secondary | ICD-10-CM | POA: Diagnosis not present

## 2022-09-03 MED ORDER — SLYND 4 MG PO TABS: 1.0000 | ORAL_TABLET | Freq: Every day | ORAL | 3 refills | Status: DC

## 2022-09-03 NOTE — Progress Notes (Signed)
Pt presents for virtual [facility] consult. Pt wants to switch from Depo to [facility] pills.

## 2022-09-03 NOTE — Progress Notes (Signed)
   TELEHEALTH GYNECOLOGY VISIT ENCOUNTER NOTE  Provider location: [facility] [place]   Patient location: Home  I connected with [name] on 09/03/22 at  2:10 PM EST by telephone and verified that I am speaking with the correct person using two identifiers. Patient was unable to do MyChart audiovisual encounter due to technical difficulties, she tried several times.    I discussed the limitations, risks, security and privacy concerns of performing an evaluation and management service by telephone and the availability of in person appointments. I also discussed with the patient that there may be a patient responsible charge related to this service. The patient expressed understanding and agreed to proceed.   History:  [name] is a 37 y.o. [phone] on Depo presenting for discussion of contraception options.   Unhappy with bleeding profile on depo. Has been on for nearly 2 years and continues to have frequent light bleeding. Had bleeding from [place]. Has tried lysteda without improvement. Wants to switch back to pills.   +tobacco use - quit 6 months ago   Past Medical History:  Diagnosis Date   History of reversal of tubal ligation 09/2018   Past Surgical History:  Procedure Laterality Date   TUBAL LIGATION  2008   Tubal reversal  09/2018   The following portions of the patient's history were reviewed and updated as appropriate: allergies, current medications, past family history, past medical history, past social history, past surgical history and problem list.   Health Maintenance:  Abnormal pap - needs colpo message sent via mychart to schedule  Review of Systems:  Pertinent items noted in HPI and remainder of comprehensive ROS otherwise negative.  Physical Exam:   General:  Alert, oriented and cooperative.   Mental Status: Normal mood and affect perceived. Normal judgment and thought content.  Physical exam deferred due to nature of the  encounter  Labs and Imaging No results found for this or any previous visit (from the past 336 hour(s)). No results found.    Assessment and Plan:   Oral contraception initiation Encounter for counseling regarding contraception Discussed progesterone only options given recent tobacco quit date and age > 30. Pt would like progestin only pills -     Drospirenone (SLYND) 4 MG TABS; Take 1 tablet (4 mg total) by mouth daily.  ASCUS/HPV+ pap Noted after tele visit. MyChart message sent to both patient & schedulers to arrange colposcopy ASAP   I discussed the assessment and treatment plan with the patient. The patient was provided an opportunity to ask questions and all were answered. The patient agreed with the plan and demonstrated an understanding of the instructions.   The patient was advised to call back or seek an in-person evaluation/go to the ED if the symptoms worsen or if the condition fails to improve as anticipated.  I provided 15 minutes of non-face-to-face time during this encounter.  Inez Catalina, MD [facility], [place]

## 2022-09-05 ENCOUNTER — Encounter: Payer: Self-pay | Admitting: Emergency Medicine

## 2022-09-05 ENCOUNTER — Other Ambulatory Visit (HOSPITAL_COMMUNITY)
Admission: RE | Admit: 2022-09-05 | Discharge: 2022-09-05 | Disposition: A | Discharge status: Home / Self Care | Payer: Medicaid Other | Source: Ambulatory Visit | Attending: Obstetrics and Gynecology | Admitting: Obstetrics and Gynecology

## 2022-09-05 ENCOUNTER — Ambulatory Visit: Payer: Medicaid Other | Admitting: Emergency Medicine

## 2022-09-05 VITALS — BP 117/83 | HR 82 | Wt 120.5 lb

## 2022-09-05 DIAGNOSIS — N898 Other specified noninflammatory disorders of vagina: Secondary | ICD-10-CM | POA: Insufficient documentation

## 2022-09-05 NOTE — Progress Notes (Signed)
SUBJECTIVE:  37 y.o. female complains of clear vaginal discharge for 3 day(s).  Denies abnormal vaginal bleeding or significant pelvic pain or fever. No UTI symptoms. Denies history of known exposure to STD.  Patient's last menstrual period was 08/19/2022.  OBJECTIVE:  She appears well, afebrile.   ASSESSMENT:  Vaginal Discharge  Vaginal Odor   PLAN:  GC, chlamydia, trichomonas, BVAG, CVAG probe sent to lab. Treatment: To be determined once lab results are received ROV prn if symptoms persist or worsen.

## 2022-09-06 LAB — CERVICOVAGINAL ANCILLARY ONLY
Bacterial Vaginitis (gardnerella): POSITIVE — AB
Candida Glabrata: NEGATIVE
Candida Vaginitis: NEGATIVE
Chlamydia: NEGATIVE
Comment: NEGATIVE
Comment: NEGATIVE
Comment: NEGATIVE
Comment: NEGATIVE
Comment: NEGATIVE
Comment: NORMAL
Neisseria Gonorrhea: NEGATIVE
Trichomonas: NEGATIVE

## 2022-09-11 ENCOUNTER — Other Ambulatory Visit: Payer: Self-pay | Admitting: Emergency Medicine

## 2022-09-11 MED ORDER — METRONIDAZOLE 500 MG PO TABS: 500.0000 mg | ORAL_TABLET | Freq: Two times a day (BID) | ORAL | 0 refills | Status: DC

## 2022-09-11 NOTE — Progress Notes (Signed)
Rx for BV per protocol.

## 2022-10-11 ENCOUNTER — Encounter: Payer: Medicaid Other | Admitting: Obstetrics and Gynecology

## 2022-10-18 ENCOUNTER — Encounter: Payer: Medicaid Other | Admitting: Obstetrics and Gynecology

## 2022-10-23 ENCOUNTER — Emergency Department (HOSPITAL_BASED_OUTPATIENT_CLINIC_OR_DEPARTMENT_OTHER): Payer: Medicaid Other

## 2022-10-23 ENCOUNTER — Emergency Department (HOSPITAL_BASED_OUTPATIENT_CLINIC_OR_DEPARTMENT_OTHER)
Admission: EM | Admit: 2022-10-23 | Discharge: 2022-10-23 | Disposition: A | Discharge status: Home / Self Care | Payer: Medicaid Other | Attending: Emergency Medicine | Admitting: Emergency Medicine

## 2022-10-23 ENCOUNTER — Other Ambulatory Visit: Payer: Self-pay

## 2022-10-23 DIAGNOSIS — R112 Nausea with vomiting, unspecified: Secondary | ICD-10-CM | POA: Insufficient documentation

## 2022-10-23 DIAGNOSIS — R319 Hematuria, unspecified: Secondary | ICD-10-CM | POA: Insufficient documentation

## 2022-10-23 LAB — COMPREHENSIVE METABOLIC PANEL
ALT: 24 U/L (ref 0–44)
AST: 20 U/L (ref 15–41)
Albumin: 5.3 g/dL — ABNORMAL HIGH (ref 3.5–5.0)
Alkaline Phosphatase: 57 U/L (ref 38–126)
Anion gap: 15 (ref 5–15)
BUN: 12 mg/dL (ref 6–20)
CO2: 23 mmol/L (ref 22–32)
Calcium: 10.7 mg/dL — ABNORMAL HIGH (ref 8.9–10.3)
Chloride: 102 mmol/L (ref 98–111)
Creatinine, Ser: 0.74 mg/dL (ref 0.44–1.00)
GFR, Estimated: >60 mL/min (ref >60)
Glucose, Bld: 140 mg/dL — ABNORMAL HIGH (ref 70–99)
Potassium: 3.2 mmol/L — ABNORMAL LOW (ref 3.5–5.1)
Sodium: 140 mmol/L (ref 135–145)
Total Bilirubin: 1 mg/dL (ref 0.3–1.2)
Total Protein: 8.5 g/dL — ABNORMAL HIGH (ref 6.5–8.1)

## 2022-10-23 LAB — URINALYSIS, ROUTINE W REFLEX MICROSCOPIC
Bacteria, UA: NONE SEEN
Bilirubin Urine: NEGATIVE
Glucose, UA: NEGATIVE mg/dL
Nitrite: NEGATIVE
Protein, ur: 100 mg/dL — AB
RBC / HPF: >50 RBC/hpf (ref 0–5)
Specific Gravity, Urine: 1.045 — ABNORMAL HIGH (ref 1.005–1.030)
pH: 6 (ref 5.0–8.0)

## 2022-10-23 LAB — CBC
HCT: 40.9 % (ref 36.0–46.0)
Hemoglobin: 14 g/dL (ref 12.0–15.0)
MCH: 28.1 pg (ref 26.0–34.0)
MCHC: 34.2 g/dL (ref 30.0–36.0)
MCV: 82.1 fL (ref 80.0–100.0)
Platelets: 256 10*3/uL (ref 150–400)
RBC: 4.98 MIL/uL (ref 3.87–5.11)
RDW: 13.4 % (ref 11.5–15.5)
WBC: 15.9 10*3/uL — ABNORMAL HIGH (ref 4.0–10.5)
nRBC: 0 % (ref 0.0–0.2)

## 2022-10-23 LAB — PREGNANCY, URINE: Preg Test, Ur: NEGATIVE

## 2022-10-23 LAB — LIPASE, BLOOD: Lipase: <10 U/L — ABNORMAL LOW (ref 11–51)

## 2022-10-23 MED ORDER — PROMETHAZINE HCL 25 MG PO TABS: 25.0000 mg | ORAL_TABLET | Freq: Four times a day (QID) | ORAL | 0 refills | Status: DC | PRN

## 2022-10-23 MED ORDER — SODIUM CHLORIDE 0.9 % IV BOLUS
1000.0000 mL | Freq: Once | INTRAVENOUS | Status: AC
  Administered 2022-10-23: 1000 mL via INTRAVENOUS

## 2022-10-23 MED ORDER — KETOROLAC TROMETHAMINE 15 MG/ML IJ SOLN
15.0000 mg | Freq: Once | INTRAMUSCULAR | Status: AC
  Administered 2022-10-23: 15 mg via INTRAVENOUS
  Filled 2022-10-23: qty 1

## 2022-10-23 MED ORDER — METOCLOPRAMIDE HCL 5 MG/ML IJ SOLN
10.0000 mg | Freq: Once | INTRAMUSCULAR | Status: AC
  Administered 2022-10-23: 10 mg via INTRAVENOUS
  Filled 2022-10-23: qty 2

## 2022-10-23 MED ORDER — POTASSIUM CHLORIDE CRYS ER 20 MEQ PO TBCR
40.0000 meq | EXTENDED_RELEASE_TABLET | Freq: Once | ORAL | Status: AC
  Administered 2022-10-23: 40 meq via ORAL
  Filled 2022-10-23: qty 2

## 2022-10-23 MED ORDER — SODIUM CHLORIDE 0.9 % IV SOLN
12.5000 mg | Freq: Four times a day (QID) | INTRAVENOUS | Status: DC | PRN
  Administered 2022-10-23: 12.5 mg via INTRAVENOUS
  Filled 2022-10-23: qty 0.5

## 2022-10-23 MED ORDER — ONDANSETRON HCL 4 MG/2ML IJ SOLN
4.0000 mg | Freq: Once | INTRAMUSCULAR | Status: AC | PRN
  Administered 2022-10-23: 4 mg via INTRAVENOUS
  Filled 2022-10-23: qty 2

## 2022-10-23 MED ORDER — PROMETHAZINE HCL 25 MG/ML IJ SOLN
INTRAMUSCULAR | Status: AC
  Filled 2022-10-23: qty 1

## 2022-10-23 MED ORDER — PROMETHAZINE HCL 25 MG RE SUPP: 25.0000 mg | Freq: Four times a day (QID) | RECTAL | 0 refills | Status: DC | PRN

## 2022-10-23 NOTE — ED Notes (Signed)
Patient to CT via w/c

## 2022-10-23 NOTE — Discharge Instructions (Addendum)
As we discussed, there are some blood in your urine.  You do not have a kidney stone or infection.  I would like you to follow-up with a primary care doctor about that.  If you do not have a PCP, please call the number on these papers to get established.  I have sent the Phenergan to your pharmacy that helped you with your nausea today.  If the Phenergan pill does not help you you may use a suppository instead.  Do not hesitate to return with any worsening or recurring symptoms.  It was a pleasure to meet you and we hope you continue to feel better.

## 2022-10-23 NOTE — ED Triage Notes (Addendum)
Wheelchair to triage. Tearful. A+Ox4.   +N/+V/abd pain since yesterday. Unable to tolerate food or drink. Afebrile. Denies EtOH, denies smoking, took pregnancy test this AM- negative.

## 2022-10-23 NOTE — ED Provider Notes (Signed)
[facility] Provider Note   CSN: [id number] Arrival date & time: 10/23/22  1805     History  Chief Complaint  Patient presents with   Emesis    [name] is a 37 y.o. female presenting today with nausea and vomiting.  [name] reports that yesterday [name] had chili's for dinner and subsequently started to have episodes of emesis.  Reports [name] has been vomiting ever since and unable to tolerate water intake.  Denies any diarrhea or constipation.  No hematemesis.  Denies drug or alcohol use.  Does not remember when [name] last menstrual period was because [name] is on OCPs.  No abdominal pain, flank pain, dysuria, hematuria, vaginal odor, vaginal discharge or itching     Emesis      Home Medications Prior to Admission medications   Medication Sig Start Date End Date Taking? Authorizing Provider  metroNIDAZOLE (FLAGYL) 500 MG tablet Take 1 tablet (500 mg total) by mouth 2 (two) times daily. 09/11/22   Inez Catalina, MD  acetaminophen (TYLENOL) 325 MG tablet Take 2 tablets (650 mg total) by mouth every 4 (four) hours as needed (for pain scale < 4). Patient not taking: Reported on 10/10/2021 12/03/20   Laury Deep, CNM  Drospirenone (SLYND) 4 MG TABS Take 1 tablet (4 mg total) by mouth daily. Patient not taking: Reported on 09/05/2022 09/03/22   Inez Catalina, MD  ibuprofen (ADVIL) 600 MG tablet Take 1 tablet (600 mg total) by mouth every 6 (six) hours. Patient not taking: Reported on 10/10/2021 12/03/20   Laury Deep, CNM  Iron, Ferrous Sulfate, 325 (65 Fe) MG TABS Take 1 tablet by mouth 2 (two) times daily. 11/07/20 05/06/21  Renee Harder, CNM  metroNIDAZOLE (FLAGYL) 500 MG tablet Take 1 tablet (500 mg total) by mouth 2 (two) times daily. Patient not taking: Reported on 03/01/2022 10/16/21   Griffin Basil, MD  Prenat-Fe Poly-Methfol-FA-DHA (VITAFOL ULTRA) 29-0.6-0.4-200 MG CAPS Take 1 tablet by mouth daily. Patient not  taking: Reported on 07/19/2020 05/24/20   Griffin Basil, MD  hydrochlorothiazide (HYDRODIURIL) 25 MG tablet Take 1 tablet (25 mg total) by mouth daily. 08/17/19 12/30/19  Constant, Peggy, MD      Allergies    Pollen extract    Review of Systems   Review of Systems  Gastrointestinal:  Positive for vomiting.    Physical Exam Updated Vital Signs BP 127/77   Pulse 93   Temp 98.5 F (36.9 C)   Resp 18   LMP 10/17/2022   SpO2 100%  Physical Exam Vitals and nursing note reviewed.  Constitutional:      Appearance: Normal appearance. [name] is ill-appearing (Writhing on the stretcher).  HENT:     Head: Normocephalic and atraumatic.  Eyes:     General: No scleral icterus.    Conjunctiva/sclera: Conjunctivae normal.  Cardiovascular:     Rate and Rhythm: Normal rate and regular rhythm.  Pulmonary:     Effort: Pulmonary effort is normal. No respiratory distress.  Abdominal:     General: Abdomen is flat.     Palpations: Abdomen is soft.     Tenderness: There is no abdominal tenderness. There is no right CVA tenderness or left CVA tenderness.  Skin:    General: Skin is warm and dry.     Findings: No rash.  Neurological:     Mental Status: [name] is alert.  Psychiatric:        Mood and Affect: Mood  normal.     ED Results / Procedures / Treatments   Labs (all labs ordered are listed, but only abnormal results are displayed) Labs Reviewed  LIPASE, BLOOD - Abnormal; Notable for the following components:      Result Value   Lipase <10 (*)    All other components within normal limits  COMPREHENSIVE METABOLIC PANEL - Abnormal; Notable for the following components:   Potassium 3.2 (*)    Glucose, Bld 140 (*)    Calcium 10.7 (*)    Total Protein 8.5 (*)    Albumin 5.3 (*)    All other components within normal limits  CBC - Abnormal; Notable for the following components:   WBC 15.9 (*)    All other components within normal limits  URINALYSIS, ROUTINE W REFLEX MICROSCOPIC - Abnormal;  Notable for the following components:   APPearance HAZY (*)    Specific Gravity, Urine 1.045 (*)    Hgb urine dipstick LARGE (*)    Ketones, ur TRACE (*)    Protein, ur 100 (*)    Leukocytes,Ua SMALL (*)    All other components within normal limits  PREGNANCY, URINE    EKG None  Radiology CT Renal Stone Study  Result Date: 10/23/2022 CLINICAL DATA:  Flank pain EXAM: CT ABDOMEN AND PELVIS WITHOUT CONTRAST TECHNIQUE: Multidetector CT imaging of the abdomen and pelvis was performed following the standard protocol without IV contrast. RADIATION DOSE REDUCTION: This exam was performed according to the departmental dose-optimization program which includes automated exposure control, adjustment of the mA and/or kV according to patient size and/or use of iterative reconstruction technique. COMPARISON:  None Available. FINDINGS: Lower chest: No acute abnormality. Hepatobiliary: No calcified gallstone or biliary dilatation. Subcentimeter hypodensities in the liver too small to further characterize. Pancreas: Unremarkable. No pancreatic ductal dilatation or surrounding inflammatory changes. Spleen: Normal in size without focal abnormality. Adrenals/Urinary Tract: Adrenal glands are unremarkable. Kidneys are normal, without renal calculi, focal lesion, or hydronephrosis. Bladder is unremarkable. Stomach/Bowel: Stomach is within normal limits. Appendix appears normal. No evidence of bowel wall thickening, distention, or inflammatory changes. Vascular/Lymphatic: No significant vascular findings are present. No enlarged abdominal or pelvic lymph nodes. Reproductive: Uterus and bilateral adnexa are unremarkable. Migrated ligation clips visible within the right lower quadrant and anterior pelvis. Other: No abdominal wall hernia or abnormality. No abdominopelvic ascites. Musculoskeletal: No acute or significant osseous findings. IMPRESSION: No CT evidence for acute intra-abdominal or pelvic abnormality. Negative for  kidney stone or hydronephrosis. Migrated tubal ligation clips as above. Electronically Signed   By: Donavan Foil M.D.   On: 10/23/2022 21:11    Procedures Procedures   Medications Ordered in ED Medications  ondansetron (ZOFRAN) injection 4 mg (4 mg Intravenous Given 10/23/22 1903)    ED Course/ Medical Decision Making/ A&P                             Medical Decision Making Amount and/or Complexity of Data Reviewed Labs: ordered. Radiology: ordered.  Risk Prescription drug management.   37 year old female presenting today with nausea and emesis. Ddx includes but isn't limited to gastroenteritis, appendicitis, Bowel obstruction, Gastroparesis, DKA, Hernia, Inflammatory bowel disease, mesenteric ischemia, pancreatitis, peritonitis SBP, volvulus.   This is not an exhaustive differential.    Physical Exam: Pertinent physical exam findings include Negative CVA tenderness and no focal abdominal tenderness Potassium 3.2 WBC 15.9  Lab Tests: I ordered, and personally interpreted labs.  The pertinent  results include: Negative pregnancy Hematuria without infection   Imaging Studies: I ordered and independently visualized and interpreted CT renal and I agree with the radiologist that there are no acute findings or explanations of [name] hematuria     Medications: Originally given Zofran in triage.  [name] reports that it helped [name] very mildly but continues to be nauseous and have episodes of emesis.  Subsequently given Reglan and Toradol for some discomfort.  Continues to have nausea.  Phenergan was then dosed and patient reported relief.  [name] was able to pass p.o. challenge.     MDM/Disposition: This is a 37 year old female who presented with nausea and emesis.  Started after dinner last night.  Differential was broad and due to [name] hematuria and some flank pain CT renal was ordered.  This was negative for stone or any other intra-abdominal abnormalities within the scope of a  noncontrasted exam.  Did consider contrasted exam, especially given WBC of 16.  Believe this is likely secondary to viral foodborne illness + inflammation from emesis.  Afebrile, not tachycardic or septic.  [name] was treated with antiemetics and eventually able to pass a p.o. challenge with Phenergan.  [name] was discharged with Phenergan and return precautions.  [name] is tolerating p.o. and does not appear to have an emergent condition requiring any further evaluation or treatment today.  [name] will be discharged home and instructed to follow-up with [name] PCP for recheck of [name] urinalysis.  [name] is agreeable to this  Final Clinical Impression(s) / ED Diagnoses Final diagnoses:  Hematuria, unspecified type  Nausea and vomiting, unspecified vomiting type    Rx / DC Orders ED Discharge Orders     None      Results and diagnoses were explained to the patient. Return precautions discussed in full. Patient had no additional questions and expressed complete understanding.   This chart was dictated using voice recognition software.  Despite best efforts to proofread,  errors can occur which can change the documentation meaning.    Rhae Hammock, PA-C 10/23/22 2349    Lorelle Gibbs, DO 10/24/22 1732

## 2023-05-10 ENCOUNTER — Encounter (HOSPITAL_COMMUNITY): Payer: Self-pay | Admitting: Emergency Medicine

## 2023-05-10 ENCOUNTER — Ambulatory Visit (HOSPITAL_COMMUNITY)
Admission: EM | Admit: 2023-05-10 | Discharge: 2023-05-10 | Disposition: A | Discharge status: Home / Self Care | Payer: Medicaid Other

## 2023-05-10 DIAGNOSIS — J069 Acute upper respiratory infection, unspecified: Secondary | ICD-10-CM | POA: Diagnosis not present

## 2023-05-10 MED ORDER — AEROCHAMBER PLUS FLO-VU MEDIUM MISC: 1.0000 | Freq: Once | Status: DC

## 2023-05-10 MED ORDER — VENTOLIN HFA 108 (90 BASE) MCG/ACT IN AERS: 2.0000 | INHALATION_SPRAY | Freq: Four times a day (QID) | RESPIRATORY_TRACT | 0 refills | Status: DC | PRN

## 2023-05-10 MED ORDER — PROMETHAZINE-DM 6.25-15 MG/5ML PO SYRP: 5.0000 mL | ORAL_SOLUTION | Freq: Four times a day (QID) | ORAL | 0 refills | Status: DC | PRN

## 2023-05-10 MED ORDER — ALBUTEROL SULFATE HFA 108 (90 BASE) MCG/ACT IN AERS: 2.0000 | INHALATION_SPRAY | Freq: Once | RESPIRATORY_TRACT | Status: DC

## 2023-05-10 NOTE — ED Triage Notes (Signed)
Pt has cough and congestion since Sunday. Reports that took Day time medication. 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  Pt needs work note for calling out today.

## 2023-05-10 NOTE — Discharge Instructions (Signed)
Work on stopping smoking. Now may be a good time to quit since it doesn't feel good to smoke right now while you are sick.   Add mucinex - ok to use generic version guaifenesin.  This will help thin out your mucus and help it drain better if you take it and drink a lot of liquids.   Ok to use over the counter cold medicines to help manage your symptoms.

## 2023-05-10 NOTE — ED Provider Notes (Signed)
[facility]    CSN: [id number] Arrival date & time: 05/10/23  0102      History   Chief Complaint Chief Complaint  Patient presents with   Cough   Nasal Congestion    HPI [name] is a 37 y.o. female. She has been sick since 04/28/23 with cough, nasal congestion, and sore throat. Tested self for covid at home and it was negative. Has been using delsym for cough but feels like it isn't strong enough. Denies wheezing but does feel SOB sometimes. She had quit smoking and was vaping but last month started smoking again and has continued to smoke during this illness even though it feels worse to smoke; feels SOB with smoking. Denies asthma but does have hx of bronchitis. Is coughing up thick yellow stuff. Denies nasal drainage or much post nasal drainage. Did have a fever of 102.7 a few days ago but none since.    Cough   Past Medical History:  Diagnosis Date   History of reversal of tubal ligation 09/2018    Patient Active Problem List   Diagnosis Date Noted   Recent unexplained weight loss 10/10/2021   Breakthrough bleeding on Depo-Provera 10/10/2021   Indication for care in labor or delivery 12/01/2020   Short interval between pregnancies affecting pregnancy in first trimester, antepartum 05/24/2020   Alpha thalassemia silent carrier 02/12/2019   Cervical high risk HPV (human papillomavirus) test positive 02/11/2019   Supervision of high risk pregnancy, antepartum 01/14/2019   Dental abscess 03/31/2017    Past Surgical History:  Procedure Laterality Date   TUBAL LIGATION  2008   Tubal reversal  09/2018    OB History     Gravida  6   Para  5   Term  5   Preterm      AB  1   Living  5      SAB  1   IAB      Ectopic      Multiple  0   Live Births  5            Home Medications    Prior to Admission medications   Medication Sig Start Date End Date Taking? Authorizing Provider  albuterol (VENTOLIN HFA) 108 (90 Base)  MCG/ACT inhaler Inhale 2 puffs into the lungs every 6 (six) hours as needed for wheezing or shortness of breath. 05/10/23  Yes Cathlyn Parsons, NP  metroNIDAZOLE (FLAGYL) 500 MG tablet Take 1 tablet (500 mg total) by mouth 2 (two) times daily. 09/11/22   Lennart Pall, MD  penicillin v potassium (VEETID) 500 MG tablet Take 500 mg by mouth every 6 (six) hours. 04/23/23  Yes [provider]  acetaminophen (TYLENOL) 325 MG tablet Take 2 tablets (650 mg total) by mouth every 4 (four) hours as needed (for pain scale < 4). Patient not taking: Reported on 10/10/2021 12/03/20   Raelyn Mora, CNM  Drospirenone (SLYND) 4 MG TABS Take 1 tablet (4 mg total) by mouth daily. Patient not taking: Reported on 09/05/2022 09/03/22   Lennart Pall, MD  ibuprofen (ADVIL) 600 MG tablet Take 1 tablet (600 mg total) by mouth every 6 (six) hours. Patient not taking: Reported on 10/10/2021 12/03/20   Raelyn Mora, CNM  Iron, Ferrous Sulfate, 325 (65 Fe) MG TABS Take 1 tablet by mouth 2 (two) times daily. 11/07/20 05/06/21  Brand Males, CNM  metroNIDAZOLE (FLAGYL) 500 MG tablet Take 1 tablet (500 mg total) by  mouth 2 (two) times daily. Patient not taking: Reported on 03/01/2022 10/16/21   Warden Fillers, MD  Prenat-Fe Poly-Methfol-FA-DHA (VITAFOL ULTRA) 29-0.6-0.4-200 MG CAPS Take 1 tablet by mouth daily. Patient not taking: Reported on 07/19/2020 05/24/20   Warden Fillers, MD  promethazine (PHENERGAN) 25 MG suppository Place 1 suppository (25 mg total) rectally every 6 (six) hours as needed for nausea or vomiting. 10/23/22   Redwine, Madison A, PA-C  promethazine (PHENERGAN) 25 MG tablet Take 1 tablet (25 mg total) by mouth every 6 (six) hours as needed for nausea or vomiting. 10/23/22   Redwine, Madison A, PA-C  promethazine-dextromethorphan (PROMETHAZINE-DM) 6.25-15 MG/5ML syrup Take 5 mLs by mouth 4 (four) times daily as needed for cough. 05/10/23  Yes Cathlyn Parsons, NP  hydrochlorothiazide  (HYDRODIURIL) 25 MG tablet Take 1 tablet (25 mg total) by mouth daily. 08/17/19 12/30/19  Constant, Peggy, MD    Family History Family History  Problem Relation Age of Onset   Healthy Mother    Healthy Father     Social History Social History   Tobacco Use   Smoking status: Some Days    Current packs/day: 0.25    Types: Cigarettes   Smokeless tobacco: Never   Tobacco comments:    Pt states she hasn't smoked in a month  Vaping Use   Vaping status: Never Used  Substance Use Topics   Alcohol use: No   Drug use: Not Currently    Types: Marijuana    Comment: no use x 4wks      Allergies   Pollen extract   Review of Systems Review of Systems  Respiratory:  Positive for cough.      Physical Exam Triage Vital Signs ED Triage Vitals  Encounter Vitals Group     BP 05/10/23 0921 96/60     Systolic BP Percentile --      Diastolic BP Percentile --      Pulse Rate 05/10/23 0921 80     Resp 05/10/23 0921 16     Temp 05/10/23 0921 98.1 F (36.7 C)     Temp Source 05/10/23 0921 Oral     SpO2 05/10/23 0921 99 %     Weight --      Height --      Head Circumference --      Peak Flow --      Pain Score 05/10/23 0920 0     Pain Loc --      Pain Education --      Exclude from Growth Chart --    No data found.  Updated Vital Signs BP 96/60 (BP Location: Left Arm)   Pulse 80   Temp 98.1 F (36.7 C) (Oral)   Resp 16   LMP 05/01/2023   SpO2 99%   Visual Acuity Right Eye Distance:   Left Eye Distance:   Bilateral Distance:    Right Eye Near:   Left Eye Near:    Bilateral Near:     Physical Exam Constitutional:      Appearance: Normal appearance.  HENT:     Right Ear: Tympanic membrane, ear canal and external ear normal.     Left Ear: Tympanic membrane, ear canal and external ear normal.     Nose: Congestion present.     Mouth/Throat:     Mouth: Mucous membranes are moist.     Pharynx: Oropharynx is clear. No oropharyngeal exudate or posterior oropharyngeal  erythema.  Cardiovascular:     Rate  and Rhythm: Normal rate and regular rhythm.  Pulmonary:     Effort: Pulmonary effort is normal.     Breath sounds: Normal breath sounds.     Comments: Does have dry cough Lymphadenopathy:     Cervical: No cervical adenopathy.  Neurological:     Mental Status: She is alert.      UC Treatments / Results  Labs (all labs ordered are listed, but only abnormal results are displayed) Labs Reviewed - No data to display  EKG   Radiology No results found.  Procedures Procedures (including critical care time)  Medications Ordered in UC Medications - No data to display  Initial Impression / Assessment and Plan / UC Course  I have reviewed the triage vital signs and the nursing notes.  Pertinent labs & imaging results that were available during my care of the patient were reviewed by me and considered in my medical decision making (see chart for details).    I wantd to try an albuterol inhaler with her but we are out here in UC. I rx one for her to try at home - she will ask pharmacy to show her how to use it. I do not know that she needs one - her lungs are clear here in UC and she is not dyspneic. But given smoking history, I want to see if an inhaler helps her right now while she is sick.   Discussed smoking cessation.   Discussed supportive care at home for illness.   Final Clinical Impressions(s) / UC Diagnoses   Final diagnoses:  Upper respiratory tract infection, unspecified type     Discharge Instructions      Work on stopping smoking. Now may be a good time to quit since it doesn't feel good to smoke right now while you are sick.   Add mucinex - ok to use generic version guaifenesin.  This will help thin out your mucus and help it drain better if you take it and drink a lot of liquids.   Ok to use over the counter cold medicines to help manage your symptoms.    ED Prescriptions     Medication Sig Dispense Auth. Provider    promethazine-dextromethorphan (PROMETHAZINE-DM) 6.25-15 MG/5ML syrup Take 5 mLs by mouth 4 (four) times daily as needed for cough. 118 mL Cathlyn Parsons, NP   albuterol (VENTOLIN HFA) 108 (90 Base) MCG/ACT inhaler Inhale 2 puffs into the lungs every 6 (six) hours as needed for wheezing or shortness of breath. 54 g Cathlyn Parsons, NP      PDMP not reviewed this encounter.   Cathlyn Parsons, NP 05/10/23 1008

## 2023-05-30 ENCOUNTER — Telehealth (HOSPITAL_COMMUNITY): Payer: Self-pay

## 2023-05-30 ENCOUNTER — Encounter (HOSPITAL_COMMUNITY): Payer: Self-pay

## 2023-05-30 ENCOUNTER — Ambulatory Visit (HOSPITAL_COMMUNITY)
Admission: EM | Admit: 2023-05-30 | Discharge: 2023-05-30 | Disposition: A | Discharge status: Home / Self Care | Payer: Medicaid Other | Attending: Emergency Medicine | Admitting: Emergency Medicine

## 2023-05-30 DIAGNOSIS — J069 Acute upper respiratory infection, unspecified: Secondary | ICD-10-CM

## 2023-05-30 DIAGNOSIS — J4531 Mild persistent asthma with (acute) exacerbation: Secondary | ICD-10-CM | POA: Diagnosis not present

## 2023-05-30 MED ORDER — IBUPROFEN 600 MG PO TABS: 600.0000 mg | ORAL_TABLET | Freq: Three times a day (TID) | ORAL | 0 refills | Status: DC | PRN

## 2023-05-30 MED ORDER — AMOXICILLIN-POT CLAVULANATE 875-125 MG PO TABS: 1.0000 | ORAL_TABLET | Freq: Two times a day (BID) | ORAL | 0 refills | Status: DC

## 2023-05-30 MED ORDER — PROMETHAZINE-DM 6.25-15 MG/5ML PO SYRP: 5.0000 mL | ORAL_SOLUTION | Freq: Four times a day (QID) | ORAL | 0 refills | Status: DC | PRN

## 2023-05-30 MED ORDER — ALBUTEROL SULFATE HFA 108 (90 BASE) MCG/ACT IN AERS: 2.0000 | INHALATION_SPRAY | Freq: Four times a day (QID) | RESPIRATORY_TRACT | 0 refills | Status: DC | PRN

## 2023-05-30 MED ORDER — PREDNISONE 10 MG PO TABS: ORAL_TABLET | ORAL | 0 refills | Status: DC

## 2023-05-30 NOTE — ED Triage Notes (Signed)
Cough / congestion- not any better since being seen last week. Patient did not get the meds as medicaid did not cover it. States productive cough in the morning with green mucus. No fevers or SOB.

## 2023-05-30 NOTE — ED Provider Notes (Signed)
[facility]    CSN: [id number] Arrival date & time: 05/30/23  1819      History   Chief Complaint Chief Complaint  Patient presents with   Cough    HPI [name] is a 37 y.o. female.   Patient presents today for worsening upper respiratory symptoms.  She has history of asthma.  She was seen last week but was unable to afford medications at that time.  She is reporting increased headache increase sore throat, fatigue and possibly intermittent fever at home.   Cough Cough characteristics:  Productive Sputum characteristics:  Green Severity:  Moderate Associated symptoms: headaches and sore throat     Past Medical History:  Diagnosis Date   History of reversal of tubal ligation 09/2018    Patient Active Problem List   Diagnosis Date Noted   Recent unexplained weight loss 10/10/2021   Breakthrough bleeding on Depo-Provera 10/10/2021   Indication for care in labor or delivery 12/01/2020   Short interval between pregnancies affecting pregnancy in first trimester, antepartum 05/24/2020   Alpha thalassemia silent carrier 02/12/2019   Cervical high risk HPV (human papillomavirus) test positive 02/11/2019   Supervision of high risk pregnancy, antepartum 01/14/2019   Dental abscess 03/31/2017    Past Surgical History:  Procedure Laterality Date   TUBAL LIGATION  2008   Tubal reversal  09/2018    OB History     Gravida  6   Para  5   Term  5   Preterm      AB  1   Living  5      SAB  1   IAB      Ectopic      Multiple  0   Live Births  5            Home Medications    Prior to Admission medications   Medication Sig Start Date End Date Taking? Authorizing Provider  amoxicillin-clavulanate (AUGMENTIN) 875-125 MG tablet Take 1 tablet by mouth every 12 (twelve) hours. 05/30/23  Yes Flavius Repsher, Linde Gillis, NP  Drospirenone (SLYND) 4 MG TABS Take 1 tablet (4 mg total) by mouth daily. 09/03/22  Yes Lennart Pall, MD  predniSONE  (DELTASONE) 10 MG tablet Take 2 tabs for 4 days then take 1 tabs for 4 days 05/30/23  Yes Evangeline Utley, Linde Gillis, NP  albuterol (VENTOLIN HFA) 108 (90 Base) MCG/ACT inhaler Inhale 2 puffs into the lungs every 6 (six) hours as needed for wheezing or shortness of breath. 05/10/23   Cathlyn Parsons, NP  ibuprofen (ADVIL) 600 MG tablet Take 1 tablet (600 mg total) by mouth 3 (three) times daily as needed for moderate pain (pain score 4-6). Take with food 05/30/23   Gian Ybarra, Linde Gillis, NP  promethazine-dextromethorphan (PROMETHAZINE-DM) 6.25-15 MG/5ML syrup Take 5 mLs by mouth 4 (four) times daily as needed for cough. 05/10/23   Cathlyn Parsons, NP  hydrochlorothiazide (HYDRODIURIL) 25 MG tablet Take 1 tablet (25 mg total) by mouth daily. 08/17/19 12/30/19  Constant, Peggy, MD    Family History Family History  Problem Relation Age of Onset   Healthy Mother    Healthy Father     Social History Social History   Tobacco Use   Smoking status: Some Days    Current packs/day: 0.25    Types: Cigarettes   Smokeless tobacco: Never   Tobacco comments:    Pt states she hasn't smoked in a month  Vaping Use   Vaping  status: Never Used  Substance Use Topics   Alcohol use: No   Drug use: Not Currently    Types: Marijuana    Comment: no use x 4wks      Allergies   Pollen extract   Review of Systems Review of Systems  Constitutional:  Positive for appetite change and fatigue.  HENT:  Positive for congestion, postnasal drip, sinus pain, sore throat, trouble swallowing and voice change.   Respiratory:  Positive for cough and chest tightness.   Neurological:  Positive for headaches.     Physical Exam Triage Vital Signs ED Triage Vitals  Encounter Vitals Group     BP 05/30/23 1916 101/69     Systolic BP Percentile --      Diastolic BP Percentile --      Pulse Rate 05/30/23 1916 73     Resp 05/30/23 1916 18     Temp 05/30/23 1916 98.5 F (36.9 C)     Temp Source 05/30/23 1916 Oral     SpO2  05/30/23 1916 99 %     Weight 05/30/23 1916 125 lb (56.7 kg)     Height 05/30/23 1916 5\' 4"  (1.626 m)     Head Circumference --      Peak Flow --      Pain Score 05/30/23 1914 0     Pain Loc --      Pain Education --      Exclude from Growth Chart --    No data found.  Updated Vital Signs BP 101/69 (BP Location: Left Arm)   Pulse 73   Temp 98.5 F (36.9 C) (Oral)   Resp 18   Ht 5\' 4"  (1.626 m)   Wt 125 lb (56.7 kg)   LMP 05/27/2023 (Exact Date)   SpO2 99%   Breastfeeding No   BMI 21.46 kg/m   Visual Acuity Right Eye Distance:   Left Eye Distance:   Bilateral Distance:    Right Eye Near:   Left Eye Near:    Bilateral Near:     Physical Exam HENT:     Right Ear: Tympanic membrane is injected.     Left Ear: Tympanic membrane is injected and retracted.     Nose:     Right Turbinates: Enlarged.     Left Turbinates: Enlarged.     Right Sinus: Maxillary sinus tenderness present.     Left Sinus: Maxillary sinus tenderness present.     Mouth/Throat:     Pharynx: Posterior oropharyngeal erythema, uvula swelling and postnasal drip present.  Cardiovascular:     Rate and Rhythm: Tachycardia present.  Pulmonary:     Effort: Respiratory distress present.  Lymphadenopathy:     Cervical: Cervical adenopathy present.  Neurological:     Mental Status: She is alert.      UC Treatments / Results  Labs (all labs ordered are listed, but only abnormal results are displayed) Labs Reviewed - No data to display  EKG   Radiology No results found.  Procedures Procedures (including critical care time)  Medications Ordered in UC Medications - No data to display  Initial Impression / Assessment and Plan / UC Course  I have reviewed the triage vital signs and the nursing notes.  Pertinent labs & imaging results that were available during my care of the patient were reviewed by me and considered in my medical decision making (see chart for details).   Findings are  consistent with worsening upper respiratory infection.  Patient is  asthmatic so there is a overlay of an asthma exacerbation along with respiratory infection.  She does have increased sinusitis pain along with uvula swelling erythemic oropharynx.  She has enlarged cervical lymph nodes.  We will treat with antibiotics and prednisone at this time.  She is to pick up her inhaler also at this time and utilize for the next week.   Final Clinical Impressions(s) / UC Diagnoses   Final diagnoses:  Acute upper respiratory infection  Mild persistent asthma with exacerbation     Discharge Instructions      Take Augmentin until completed. Prednisone 10 mg tablets.  2 tabs x 4 days then 1 tab x 4 days. Inhaler 4 times a day as needed for shortness of breath. Salt water gargles. Follow-up with primary care if no improvement.     ED Prescriptions     Medication Sig Dispense Auth. Provider   ibuprofen (ADVIL) 600 MG tablet Take 1 tablet (600 mg total) by mouth 3 (three) times daily as needed for moderate pain (pain score 4-6). Take with food 30 tablet Renarda Mullinix, Linde Gillis, NP   amoxicillin-clavulanate (AUGMENTIN) 875-125 MG tablet Take 1 tablet by mouth every 12 (twelve) hours. 14 tablet Domnic Vantol, Linde Gillis, NP   predniSONE (DELTASONE) 10 MG tablet Take 2 tabs for 4 days then take 1 tabs for 4 days 12 tablet Israel Werts, Linde Gillis, NP      PDMP not reviewed this encounter.   Nelda Marseille, NP 05/30/23 1944

## 2023-05-30 NOTE — Discharge Instructions (Addendum)
Take Augmentin until completed. Prednisone 10 mg tablets.  2 tabs x 4 days then 1 tab x 4 days. Inhaler 4 times a day as needed for shortness of breath. Salt water gargles. Follow-up with primary care if no improvement.

## 2023-06-04 ENCOUNTER — Ambulatory Visit: Payer: Medicaid Other

## 2023-06-04 ENCOUNTER — Other Ambulatory Visit (HOSPITAL_COMMUNITY)
Admission: RE | Admit: 2023-06-04 | Discharge: 2023-06-04 | Disposition: A | Discharge status: Home / Self Care | Payer: Medicaid Other | Source: Ambulatory Visit | Attending: Obstetrics and Gynecology | Admitting: Obstetrics and Gynecology

## 2023-06-04 VITALS — BP 102/68 | HR 80 | Wt 119.0 lb

## 2023-06-04 DIAGNOSIS — N898 Other specified noninflammatory disorders of vagina: Secondary | ICD-10-CM

## 2023-06-04 NOTE — Progress Notes (Signed)
SUBJECTIVE:  37 y.o. female complains of  vaginal discharge and odor for . Denies abnormal vaginal bleeding or significant pelvic pain or fever. No UTI symptoms. Denies history of known exposure to STD.  Patient's last menstrual period was 05/27/2023 (exact date).  OBJECTIVE:  She appears well, afebrile. Urine dipstick: not done.  ASSESSMENT:  Vaginal Discharge  Vaginal Odor   PLAN:  GC, chlamydia, trichomonas, BVAG, CVAG probe sent to lab. Treatment: To be determined once lab results are received ROV prn if symptoms persist or worsen.

## 2023-06-06 LAB — CERVICOVAGINAL ANCILLARY ONLY
Candida Glabrata: NEGATIVE
Candida Vaginitis: POSITIVE — AB
Comment: NEGATIVE
Comment: NEGATIVE
Comment: NEGATIVE
Trichomonas: POSITIVE — AB

## 2023-06-07 MED ORDER — FLUCONAZOLE 150 MG PO TABS: 150.0000 mg | ORAL_TABLET | Freq: Once | ORAL | 0 refills | Status: AC

## 2023-06-07 MED ORDER — METRONIDAZOLE 500 MG PO TABS
500.0000 mg | ORAL_TABLET | Freq: Two times a day (BID) | ORAL | 0 refills | Status: DC
Start: 2023-06-07 — End: 2023-11-05

## 2023-06-07 NOTE — Addendum Note (Signed)
Addended by: Catalina Antigua on: 06/07/2023 08:57 AM   Modules accepted: Orders

## 2023-08-11 ENCOUNTER — Other Ambulatory Visit: Payer: Self-pay | Admitting: Obstetrics and Gynecology

## 2023-08-28 ENCOUNTER — Encounter (HOSPITAL_COMMUNITY): Payer: Self-pay

## 2023-08-28 ENCOUNTER — Ambulatory Visit (HOSPITAL_COMMUNITY)
Admission: EM | Admit: 2023-08-28 | Discharge: 2023-08-28 | Disposition: A | Discharge status: Home / Self Care | Payer: Medicaid Other | Attending: Emergency Medicine | Admitting: Emergency Medicine

## 2023-08-28 DIAGNOSIS — B9789 Other viral agents as the cause of diseases classified elsewhere: Secondary | ICD-10-CM | POA: Diagnosis not present

## 2023-08-28 DIAGNOSIS — J988 Other specified respiratory disorders: Secondary | ICD-10-CM | POA: Diagnosis not present

## 2023-08-28 MED ORDER — IBUPROFEN 800 MG PO TABS
ORAL_TABLET | ORAL | Status: AC
  Filled 2023-08-28: qty 1

## 2023-08-28 MED ORDER — IBUPROFEN 800 MG PO TABS
800.0000 mg | ORAL_TABLET | Freq: Once | ORAL | Status: AC
  Administered 2023-08-28: 800 mg via ORAL

## 2023-08-28 NOTE — ED Triage Notes (Signed)
 Pt states coughing,sneezing, body aches and the chills for the past 4 days.  States she has been taking OTC cold medication at home.

## 2023-08-28 NOTE — ED Provider Notes (Signed)
 [facility]    CSN: [id number] Arrival date & time: 08/28/23  1929      History   Chief Complaint Chief Complaint  Patient presents with   Cough    HPI [name] is a 38 y.o. female.   Patient presents with cough, sneezing, body aches, headaches, and chills x 4 days.  Patient reports taking Tylenol  Cold and flu with some relief.  Denies abdominal pain, nausea, vomiting, diarrhea, shortness of breath, and chest pain.   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 Authorizing Provider  albuterol  (VENTOLIN  HFA) 108 (90 Base) MCG/ACT inhaler Inhale 2 puffs into the lungs every 6 (six) hours as needed for wheezing or shortness of breath. 05/30/23   Kabbe, Angela M, NP  amoxicillin -clavulanate (AUGMENTIN ) 875-125 MG tablet Take 1 tablet by mouth every 12 (twelve) hours. 05/30/23   Blitch, Marval HERO, NP  ibuprofen  (ADVIL ) 600 MG tablet Take 1 tablet (600 mg total) by mouth 3 (three) times daily as needed for moderate pain (pain score 4-6). Take with food 05/30/23   Blitch, Marval HERO, NP  metroNIDAZOLE  (FLAGYL ) 500 MG tablet Take 1 tablet (500 mg total) by mouth 2 (two) times daily. 06/07/23   Constant, Peggy, MD  SLYND  4 MG TABS TAKE 1 TABLET BY MOUTH EVERY DAY 08/12/23   Fredirick Glenys RAMAN, MD  hydrochlorothiazide  (HYDRODIURIL ) 25 MG tablet Take 1 tablet (25 mg total) by mouth daily. 08/17/19 12/30/19  Constant, Peggy, MD    Family History Family History  Problem Relation Age of Onset   Healthy Mother    Healthy Father     Social History Social History   Tobacco Use   Smoking status: Some Days    Current packs/day: 0.25    Types: Cigarettes   Smokeless tobacco: Never   Tobacco comments:    Pt states she hasn't smoked in a month  Vaping Use   Vaping status: Never Used  Substance Use Topics   Alcohol use: No   Drug use: Not Currently    Types: Marijuana    Comment: no use x 4wks  Allergies   Pollen extract   Review of Systems Review of Systems  Constitutional:  Positive for chills and fatigue.  HENT:  Positive for congestion, rhinorrhea and sneezing.   Respiratory:  Positive for cough. Negative for chest tightness, shortness of breath and wheezing.   Cardiovascular:  Negative for chest pain.  Gastrointestinal:  Negative for abdominal pain, diarrhea, nausea and vomiting.  Musculoskeletal:  Positive for arthralgias.  Neurological:  Positive for headaches. Negative for weakness.     Physical Exam Triage Vital Signs ED Triage Vitals  Encounter Vitals Group     BP 08/28/23 2007 93/67     Systolic BP Percentile --      Diastolic BP Percentile --      Pulse Rate 08/28/23 2007 91     Resp 08/28/23 2007 16     Temp 08/28/23 2007 99 F (37.2 C)     Temp Source 08/28/23 2007 Oral     SpO2 08/28/23 2007 98 %     Weight --      Height --      Head Circumference --       Peak Flow --      Pain Score 08/28/23 2005 0     Pain Loc --      Pain Education --      Exclude from Growth Chart --    No data found.  Updated Vital Signs BP 93/67 (BP Location: Left Arm)   Pulse 91   Temp 99 F (37.2 C) (Oral)   Resp 16   LMP 08/28/2023   SpO2 98%   Visual Acuity Right Eye Distance:   Left Eye Distance:   Bilateral Distance:    Right Eye Near:   Left Eye Near:    Bilateral Near:     Physical Exam Vitals and nursing note reviewed.  Constitutional:      General: She is awake. She is not in acute distress.    Appearance: Normal appearance. She is well-developed and well-groomed. She is ill-appearing. She is not toxic-appearing or diaphoretic.  HENT:     Right Ear: Tympanic membrane, ear canal and external ear normal.     Left Ear: Tympanic membrane, ear canal and external ear normal.     Nose: Congestion and rhinorrhea present.     Mouth/Throat:     Mouth: Mucous membranes are moist.     Pharynx: Posterior oropharyngeal erythema present. No oropharyngeal exudate.  Cardiovascular:     Rate and Rhythm: Normal rate and regular rhythm.  Pulmonary:     Effort: Pulmonary effort is normal.     Breath sounds: Normal breath sounds.  Musculoskeletal:        General: Normal range of motion.     Cervical back: Normal range of motion and neck supple.  Skin:    General: Skin is warm and dry.  Neurological:     Mental Status: She is alert.  Psychiatric:        Behavior: Behavior is cooperative.      UC Treatments / Results  Labs (all labs ordered are listed, but only abnormal results are displayed) Labs Reviewed - No data to display  EKG   Radiology No results found.  Procedures Procedures (including critical care time)  Medications Ordered in UC Medications  ibuprofen  (ADVIL ) tablet 800 mg (has no administration in time range)    Initial Impression / Assessment and Plan / UC Course  I have reviewed the triage vital signs and the  nursing  notes.  Pertinent labs & imaging results that were available during my care of the patient were reviewed by me and considered in my medical decision making (see chart for details).     Patient presented with 4-day history of cough, sneezing, body aches, headaches, and chills.  Denies any other symptoms.  Upon assessment congestion and rhinorrhea are present, mild erythema noted to pharynx.  Lungs clear bilaterally on auscultation.  Patient endorses 10 out of 10 body aches and headache at this time.  Given 800 mg ibuprofen  in clinic.  Discussed over-the-counter medication for viral illness symptoms.  Discussed return precautions. Final Clinical Impressions(s) / UC Diagnoses   Final diagnoses:  Viral respiratory illness     Discharge Instructions      I believe your symptoms are from a viral illness. You can alternate between Tylenol  and Ibuprofen  as needed for pain and fever. I recommend Mucinex for cough and congestion as needed. Stay hydrated and get plenty of rest. Return here if symptoms persist or worsen.       ED Prescriptions   None    PDMP not reviewed this encounter.   Johnie Flaming A, NP 08/28/23 2037

## 2023-08-28 NOTE — Discharge Instructions (Signed)
 I believe your symptoms are from a viral illness. You can alternate between Tylenol and Ibuprofen as needed for pain and fever. I recommend Mucinex for cough and congestion as needed. Stay hydrated and get plenty of rest. Return here if symptoms persist or worsen.

## 2023-09-09 ENCOUNTER — Ambulatory Visit: Payer: Medicaid Other | Admitting: Obstetrics & Gynecology

## 2023-09-25 ENCOUNTER — Ambulatory Visit: Payer: Medicaid Other | Admitting: Medical

## 2023-10-24 ENCOUNTER — Ambulatory Visit: Admitting: Certified Nurse Midwife

## 2023-10-24 DIAGNOSIS — Z124 Encounter for screening for malignant neoplasm of cervix: Secondary | ICD-10-CM

## 2023-10-24 DIAGNOSIS — Z01419 Encounter for gynecological examination (general) (routine) without abnormal findings: Secondary | ICD-10-CM

## 2023-10-31 ENCOUNTER — Emergency Department (HOSPITAL_BASED_OUTPATIENT_CLINIC_OR_DEPARTMENT_OTHER)
Admission: EM | Admit: 2023-10-31 | Discharge: 2023-11-01 | Disposition: A | Discharge status: Home / Self Care | Attending: Emergency Medicine | Admitting: Emergency Medicine

## 2023-10-31 ENCOUNTER — Other Ambulatory Visit: Payer: Self-pay

## 2023-10-31 DIAGNOSIS — L0291 Cutaneous abscess, unspecified: Secondary | ICD-10-CM

## 2023-10-31 DIAGNOSIS — L02412 Cutaneous abscess of left axilla: Secondary | ICD-10-CM | POA: Diagnosis not present

## 2023-10-31 DIAGNOSIS — R2232 Localized swelling, mass and lump, left upper limb: Secondary | ICD-10-CM | POA: Diagnosis present

## 2023-10-31 MED ORDER — LIDOCAINE-EPINEPHRINE (PF) 2 %-1:200000 IJ SOLN
20.0000 mL | Freq: Once | INTRAMUSCULAR | Status: AC
  Administered 2023-11-01: 20 mL
  Filled 2023-10-31: qty 20

## 2023-10-31 MED ORDER — LIDOCAINE-EPINEPHRINE-TETRACAINE (LET) TOPICAL GEL
3.0000 mL | Freq: Once | TOPICAL | Status: AC
  Administered 2023-11-01: 3 mL via TOPICAL
  Filled 2023-10-31: qty 3

## 2023-10-31 MED ORDER — OXYCODONE-ACETAMINOPHEN 5-325 MG PO TABS
1.0000 | ORAL_TABLET | Freq: Once | ORAL | Status: AC
  Administered 2023-11-01: 1 via ORAL
  Filled 2023-10-31: qty 1

## 2023-10-31 NOTE — ED Provider Notes (Signed)
 [place] EM[facility]PARTMENT AT [facility] Provider Note   CSN: [id number] Arrival date & time: 10/31/23  2228     History {Add pertinent medical, surgical, social history, OB history to HPI:1} Chief Complaint  Patient presents with   Cyst    [name] is a 38 y.o. female.  HPI     This is a 38 year old female who presents with concerns for a boil.  Patient reports 3-day history of increasing pain and swelling in the left axilla.  This has happened to her before.  She has been using warm soaks but has not had any spontaneous drainage.  No fevers.  Home Medications Prior to Admission medications   Medication Sig Start Date End Date Taking? Authorizing Provider  albuterol (VENTOLIN HFA) 108 (90 Base) MCG/ACT inhaler Inhale 2 puffs into the lungs every 6 (six) hours as needed for wheezing or shortness of breath. 05/30/23   Cathlyn Parsons, NP  amoxicillin-clavulanate (AUGMENTIN) 875-125 MG tablet Take 1 tablet by mouth every 12 (twelve) hours. 05/30/23   Blitch, Linde Gillis, NP  ibuprofen (ADVIL) 600 MG tablet Take 1 tablet (600 mg total) by mouth 3 (three) times daily as needed for moderate pain (pain score 4-6). Take with food 05/30/23   Blitch, Linde Gillis, NP  metroNIDAZOLE (FLAGYL) 500 MG tablet Take 1 tablet (500 mg total) by mouth 2 (two) times daily. 06/07/23   Constant, Peggy, MD  SLYND 4 MG TABS TAKE 1 TABLET BY MOUTH EVERY DAY 08/12/23   Reva Bores, MD  hydrochlorothiazide (HYDRODIURIL) 25 MG tablet Take 1 tablet (25 mg total) by mouth daily. 08/17/19 12/30/19  Constant, Peggy, MD      Allergies    Pollen extract    Review of Systems   Review of Systems  Constitutional:  Negative for fever.  Skin:        Boil  All other systems reviewed and are negative.   Physical Exam Updated Vital Signs BP 117/64 (BP Location: Right Arm)   Pulse 87   Temp (!) 97.4 F (36.3 C)   Resp 19   Ht 1.6 m (5\' 3" )   Wt 56.7 kg   LMP 10/28/2023 (Exact Date)   SpO2 97%    BMI 22.14 kg/m  Physical Exam Vitals and nursing note reviewed.  Constitutional:      Appearance: She is well-developed. She is not ill-appearing.  HENT:     Head: Normocephalic and atraumatic.  Eyes:     Pupils: Pupils are equal, round, and reactive to light.  Cardiovascular:     Rate and Rhythm: Normal rate and regular rhythm.  Pulmonary:     Effort: Pulmonary effort is normal. No respiratory distress.  Abdominal:     Palpations: Abdomen is soft.  Musculoskeletal:     Cervical back: Neck supple.  Skin:    General: Skin is warm and dry.     Comments: Significant fluctuance noted in the left axilla, no overlying skin changes, tenderness to palpation  Neurological:     Mental Status: She is alert and oriented to person, place, and time.  Psychiatric:        Mood and Affect: Mood normal.     ED Results / Procedures / Treatments   Labs (all labs ordered are listed, but only abnormal results are displayed) Labs Reviewed - No data to display  EKG None  Radiology No results found.  Procedures Procedures  {Document cardiac monitor, telemetry assessment procedure when appropriate:1}  Medications Ordered  in ED Medications  oxyCODONE-acetaminophen (PERCOCET/ROXICET) 5-325 MG per tablet 1 tablet (has no administration in time range)  lidocaine-EPINEPHrine (XYLOCAINE W/EPI) 2 %-1:200000 (PF) injection 20 mL (has no administration in time range)  lidocaine-EPINEPHrine-tetracaine (LET) topical gel (has no administration in time range)    ED Course/ Medical Decision Making/ A&P   {   Click here for ABCD2, HEART and other calculatorsREFRESH Note before signing :1}                              Medical Decision Making Risk Prescription drug management.   ***  {Document critical care time when appropriate:1} {Document review of labs and clinical decision tools ie heart score, Chads2Vasc2 etc:1}  {Document your independent review of radiology images, and any outside  records:1} {Document your discussion with family members, caretakers, and with consultants:1} {Document social determinants of health affecting pt's care:1} {Document your decision making why or why not admission, treatments were needed:1} Final Clinical Impression(s) / ED Diagnoses Final diagnoses:  None    Rx / DC Orders ED Discharge Orders     None

## 2023-10-31 NOTE — ED Triage Notes (Signed)
 Pt POV from home reporting cyst L axilla, tried at home remedies to help with pain with no success.

## 2023-11-01 NOTE — Discharge Instructions (Addendum)
 You had an abscess drained in your left armpit.  Keep packing in place for 2 to 3 days then remove.  You may benefit from following up with dermatology given recurrence of your symptoms especially in the axilla.  If you develop fevers or redness, you should be reevaluated.

## 2023-11-05 ENCOUNTER — Ambulatory Visit

## 2023-11-05 ENCOUNTER — Ambulatory Visit (INDEPENDENT_AMBULATORY_CARE_PROVIDER_SITE_OTHER): Payer: Self-pay | Admitting: Nurse Practitioner

## 2023-11-05 ENCOUNTER — Encounter: Payer: Self-pay | Admitting: Nurse Practitioner

## 2023-11-05 VITALS — BP 104/71 | HR 85 | Temp 97.0°F | Ht 63.0 in | Wt 118.0 lb

## 2023-11-05 DIAGNOSIS — Z09 Encounter for follow-up examination after completed treatment for conditions other than malignant neoplasm: Secondary | ICD-10-CM | POA: Insufficient documentation

## 2023-11-05 DIAGNOSIS — Z131 Encounter for screening for diabetes mellitus: Secondary | ICD-10-CM | POA: Diagnosis not present

## 2023-11-05 DIAGNOSIS — F17200 Nicotine dependence, unspecified, uncomplicated: Secondary | ICD-10-CM | POA: Diagnosis not present

## 2023-11-05 DIAGNOSIS — L732 Hidradenitis suppurativa: Secondary | ICD-10-CM | POA: Insufficient documentation

## 2023-11-05 HISTORY — DX: Nicotine dependence, unspecified, uncomplicated: F17.200

## 2023-11-05 LAB — POCT GLYCOSYLATED HEMOGLOBIN (HGB A1C): Hemoglobin A1C: 5 % (ref 4.0–5.6)

## 2023-11-05 MED ORDER — DOXYCYCLINE HYCLATE 100 MG PO TABS
100.0000 mg | ORAL_TABLET | Freq: Two times a day (BID) | ORAL | 0 refills | Status: AC
Start: 2023-11-05 — End: 2023-11-12

## 2023-11-05 NOTE — Patient Instructions (Signed)
 1. Hidradenitis axillaris (Primary)  - doxycycline (VIBRA-TABS) 100 MG tablet; Take 1 tablet (100 mg total) by mouth 2 (two) times daily for 7 days.  Dispense: 14 tablet; Refill: 0 - Ambulatory referral to Dermatology  2. Tobacco use disorder    It is important that you exercise regularly at least 30 minutes 5 times a week as tolerated  Think about what you will eat, plan ahead. Choose " clean, green, fresh or frozen" over canned, processed or packaged foods which are more sugary, salty and fatty. 70 to 75% of food eaten should be vegetables and fruit. Three meals at set times with snacks allowed between meals, but they must be fruit or vegetables. Aim to eat over a 12 hour period , example 7 am to 7 pm, and STOP after  your last meal of the day. Drink water,generally about 64 ounces per day, no other drink is as healthy. Fruit juice is best enjoyed in a healthy way, by EATING the fruit.  Thanks for choosing [facility] we consider it a privelige to serve you.

## 2023-11-05 NOTE — Assessment & Plan Note (Signed)
 Lab Results  Component Value Date   HGBA1C 5.0 11/05/2023

## 2023-11-05 NOTE — Progress Notes (Signed)
 New Patient Office Visit  Subjective:  Patient ID: [name], female    DOB: 1986/07/05  Age: 38 y.o. MRN: [id number]  CC:  Chief Complaint  Patient presents with   Establish Care   Recurrent Skin Infections    Under left arm    HPI BLESSEN [name] is a 38 y.o. female  has a past medical history of Alpha thalassemia silent carrier (02/12/2019), Cervical high risk HPV (human papillomavirus) test positive (02/11/2019), History of reversal of tubal ligation (09/2018), and Tobacco use disorder (11/05/2023).  Patient presents to establish care and to follow-up for recurrent skin infection on her axillary area.  Abscess patient had presented to the emergency department on 10/31/2023 for complaints of above a increase in pain and swelling in the left axilla.  Stated that she has had this boils in the past and would do warm soaks at home.  At the emergency department abscess was drained, and there was a recommendation for the patient to follow-up with dermatology.  Today patient states that she continues to have drainage from the left axilla and  that the site is painful.  She denies fever, chills, malaise.   Tobacco use disorder.  Started smoking at age 79, states that 1 pack of cigarettes lasts her 2 to 3 days.  She currently denies shortness of breath, cough, wheezing      Past Medical History:  Diagnosis Date   Alpha thalassemia silent carrier 02/12/2019   Cervical high risk HPV (human papillomavirus) test positive 02/11/2019   Cytology normal  [ ]  co-test 01/2020     History of reversal of tubal ligation 09/2018   Tobacco use disorder 11/05/2023    Past Surgical History:  Procedure Laterality Date   TUBAL LIGATION  2008   Tubal reversal  09/2018    Family History  Problem Relation Age of Onset   Healthy Mother    Healthy Father     Social History   Socioeconomic History   Marital status: Single    Spouse name: Not on file   Number of children: 5   Years of  education: Not on file   Highest education level: Not on file  Occupational History   Not on file  Tobacco Use   Smoking status: Some Days    Current packs/day: 0.25    Types: Cigarettes   Smokeless tobacco: Never   Tobacco comments:    Pt states she hasn't smoked in a month  Vaping Use   Vaping status: Never Used  Substance and Sexual Activity   Alcohol use: No   Drug use: Not Currently    Types: Marijuana    Comment: no use x 4wks    Sexual activity: Yes  Other Topics Concern   Not on file  Social History Narrative   Lives with her children   Social Drivers of Corporate investment banker Strain: Not on file  Food Insecurity: Not on file  Transportation Needs: Not on file  Physical Activity: Not on file  Stress: Not on file  Social Connections: Not on file  Intimate Partner Violence: Not on file    ROS Review of Systems  Constitutional:  Negative for appetite change, chills, fatigue and fever.  HENT:  Negative for congestion, postnasal drip, rhinorrhea and sneezing.   Respiratory:  Negative for cough, shortness of breath and wheezing.   Cardiovascular:  Negative for chest pain, palpitations and leg swelling.  Gastrointestinal:  Negative for abdominal pain, constipation, nausea and  vomiting.  Genitourinary:  Negative for difficulty urinating, dysuria, flank pain and frequency.  Musculoskeletal:  Negative for arthralgias, back pain, joint swelling and myalgias.  Skin:  Positive for wound. Negative for color change and pallor.  Neurological:  Negative for dizziness, facial asymmetry, weakness, numbness and headaches.  Psychiatric/Behavioral:  Negative for behavioral problems, confusion, self-injury and suicidal ideas.     Objective:   Today's Vitals: BP 104/71   Pulse 85   Temp (!) 97 F (36.1 C)   Ht 5\' 3"  (1.6 m)   Wt 118 lb (53.5 kg)   LMP 10/28/2023 (Exact Date)   SpO2 100%   BMI 20.90 kg/m   Physical Exam Vitals and nursing note reviewed.   Constitutional:      General: She is not in acute distress.    Appearance: Normal appearance. She is not ill-appearing, toxic-appearing or diaphoretic.  HENT:     Mouth/Throat:     Mouth: Mucous membranes are moist.     Pharynx: Oropharynx is clear. No oropharyngeal exudate or posterior oropharyngeal erythema.  Eyes:     General: No scleral icterus.       Right eye: No discharge.        Left eye: No discharge.     Extraocular Movements: Extraocular movements intact.     Conjunctiva/sclera: Conjunctivae normal.  Cardiovascular:     Rate and Rhythm: Normal rate and regular rhythm.     Pulses: Normal pulses.     Heart sounds: Normal heart sounds. No murmur heard.    No friction rub. No gallop.  Pulmonary:     Effort: Pulmonary effort is normal. No respiratory distress.     Breath sounds: Normal breath sounds. No stridor. No wheezing, rhonchi or rales.  Chest:     Chest wall: No tenderness.  Abdominal:     General: There is no distension.     Palpations: Abdomen is soft.     Tenderness: There is no abdominal tenderness. There is no right CVA tenderness, left CVA tenderness or guarding.  Musculoskeletal:        General: No swelling, tenderness, deformity or signs of injury.     Right lower leg: No edema.     Left lower leg: No edema.  Skin:    General: Skin is warm and dry.     Capillary Refill: Capillary refill takes less than 2 seconds.     Coloration: Skin is not jaundiced or pale.     Findings: Lesion present. No bruising or erythema.     Comments: Tender lesions consistent with hidradenitis noted in both axillary areas, no drainage or redness noted  Neurological:     Mental Status: She is alert and oriented to person, place, and time.     Motor: No weakness.     Coordination: Coordination normal.     Gait: Gait normal.  Psychiatric:        Mood and Affect: Mood normal.        Behavior: Behavior normal.        Thought Content: Thought content normal.        Judgment:  Judgment normal.     Assessment & Plan:   Problem List Items Addressed This Visit       Musculoskeletal and Integument   Hidradenitis axillaris - Primary   1. Hidradenitis axillaris (Primary) Smoking cessation encouraged Gauze dressing provided,  keep skin clean and dry Take Tylenol or ibuprofen as needed for pain - doxycycline (VIBRA-TABS) 100 MG tablet; Take  1 tablet (100 mg total) by mouth 2 (two) times daily for 7 days.  Dispense: 14 tablet; Refill: 0 - Ambulatory referral to Dermatology        Relevant Medications   doxycycline (VIBRA-TABS) 100 MG tablet   Other Relevant Orders   Ambulatory referral to Dermatology     Other   Tobacco use disorder   Smokes about less than 0.5 pack/day  Asked about quitting: confirms that he/she currently smokes cigarettes Advise to quit smoking: Educated about QUITTING to reduce the risk of cancer, cardio and cerebrovascular disease. Assess willingness: Unwilling to quit at this time, but is working on cutting back. Assist with counseling and pharmacotherapy: Counseled for 5 minutes and literature provided. Arrange for follow up: follow up in 4 weeks and continue to offer help.       Screening for diabetes mellitus   Lab Results  Component Value Date   HGBA1C 5.0 11/05/2023         Relevant Orders   POCT glycosylated hemoglobin (Hb A1C) (Completed)   Encounter for examination following treatment at hospital    Outpatient Encounter Medications as of 11/05/2023  Medication Sig   doxycycline (VIBRA-TABS) 100 MG tablet Take 1 tablet (100 mg total) by mouth 2 (two) times daily for 7 days.   SLYND 4 MG TABS TAKE 1 TABLET BY MOUTH EVERY DAY   albuterol (VENTOLIN HFA) 108 (90 Base) MCG/ACT inhaler Inhale 2 puffs into the lungs every 6 (six) hours as needed for wheezing or shortness of breath. (Patient not taking: Reported on 11/05/2023)   [DISCONTINUED] amoxicillin-clavulanate (AUGMENTIN) 875-125 MG tablet Take 1 tablet by mouth every  12 (twelve) hours. (Patient not taking: Reported on 11/05/2023)   [DISCONTINUED] hydrochlorothiazide (HYDRODIURIL) 25 MG tablet Take 1 tablet (25 mg total) by mouth daily. (Patient not taking: Reported on 11/05/2023)   [DISCONTINUED] ibuprofen (ADVIL) 600 MG tablet Take 1 tablet (600 mg total) by mouth 3 (three) times daily as needed for moderate pain (pain score 4-6). Take with food (Patient not taking: Reported on 11/05/2023)   [DISCONTINUED] metroNIDAZOLE (FLAGYL) 500 MG tablet Take 1 tablet (500 mg total) by mouth 2 (two) times daily. (Patient not taking: Reported on 11/05/2023)   No facility-administered encounter medications on file as of 11/05/2023.    Follow-up: Return in about 4 weeks (around 12/03/2023) for CPE.   Luretha Eberly R Kaj Vasil, FNP

## 2023-11-05 NOTE — Assessment & Plan Note (Addendum)
 1. Hidradenitis axillaris (Primary) Smoking cessation encouraged Gauze dressing provided,  keep skin clean and dry Take Tylenol or ibuprofen as needed for pain - doxycycline (VIBRA-TABS) 100 MG tablet; Take 1 tablet (100 mg total) by mouth 2 (two) times daily for 7 days.  Dispense: 14 tablet; Refill: 0 - Ambulatory referral to Dermatology

## 2023-11-05 NOTE — Assessment & Plan Note (Signed)
 Smokes about less than 0.5 pack/day  Asked about quitting: confirms that he/she currently smokes cigarettes Advise to quit smoking: Educated about QUITTING to reduce the risk of cancer, cardio and cerebrovascular disease. Assess willingness: Unwilling to quit at this time, but is working on cutting back. Assist with counseling and pharmacotherapy: Counseled for 5 minutes and literature provided. Arrange for follow up: follow up in 4 weeks and continue to offer help.

## 2023-11-21 ENCOUNTER — Other Ambulatory Visit (HOSPITAL_COMMUNITY)
Admission: RE | Admit: 2023-11-21 | Discharge: 2023-11-21 | Disposition: A | Discharge status: Home / Self Care | Source: Ambulatory Visit | Attending: Obstetrics and Gynecology | Admitting: Obstetrics and Gynecology

## 2023-11-21 ENCOUNTER — Ambulatory Visit

## 2023-11-21 VITALS — BP 107/67 | HR 78

## 2023-11-21 DIAGNOSIS — Z113 Encounter for screening for infections with a predominantly sexual mode of transmission: Secondary | ICD-10-CM | POA: Diagnosis present

## 2023-11-21 NOTE — Progress Notes (Signed)
 SUBJECTIVE:  38 y.o. female who desires a STI screen. Denies abnormal vaginal discharge, bleeding or significant pelvic pain. No UTI symptoms. Denies history of known exposure to STD.  Patient's last menstrual period was 10/28/2023 (exact date).  OBJECTIVE:  She appears well.   ASSESSMENT:  STI Screen   PLAN:  Pt offered STI blood screening-not indicated GC, chlamydia, and trichomonas probe sent to lab.  Treatment: To be determined once lab results are received.  Pt follow up as needed.

## 2023-11-22 LAB — CERVICOVAGINAL ANCILLARY ONLY
Bacterial Vaginitis (gardnerella): NEGATIVE
Candida Glabrata: NEGATIVE
Candida Vaginitis: POSITIVE — AB
Chlamydia: NEGATIVE
Comment: NEGATIVE
Comment: NEGATIVE
Comment: NEGATIVE
Comment: NEGATIVE
Comment: NEGATIVE
Comment: NORMAL
Neisseria Gonorrhea: NEGATIVE
Trichomonas: NEGATIVE

## 2023-11-25 ENCOUNTER — Other Ambulatory Visit: Payer: Self-pay

## 2023-11-25 MED ORDER — FLUCONAZOLE 150 MG PO TABS: 150.0000 mg | ORAL_TABLET | Freq: Once | ORAL | 0 refills | Status: AC

## 2023-11-25 NOTE — Progress Notes (Signed)
 Yeast rx sent per protocol

## 2023-11-28 ENCOUNTER — Telehealth: Payer: Self-pay

## 2023-11-28 NOTE — Telephone Encounter (Signed)
 Copied from CRM [id number]. Topic: Clinical - Medical Advice >> Nov 22, 2023  3:52 PM [name] wrote: Reason for CRM: Pt would like to know if she can be recommended for a supplement to help with weight gain. States she is having issues eating and not eating correctly. Is losing weight. States she may only eat one meal a day.   Also would like to get a recommendation for a natural remedy to smoking cessations. Does not wish to take anything prescribed there, something natural.   If so this is the preferred pharmacy:  [id number] - [place], [place] - [address] AT [facility] [address] [place] [address] Phone: [phone] Fax: [phone] Hours: Open 24 hours  Can contact through MyChart or phone call as well. [phone]

## 2023-12-10 ENCOUNTER — Ambulatory Visit: Payer: Self-pay | Admitting: Nurse Practitioner

## 2023-12-17 ENCOUNTER — Ambulatory Visit: Admitting: Certified Nurse Midwife

## 2023-12-17 DIAGNOSIS — Z3009 Encounter for other general counseling and advice on contraception: Secondary | ICD-10-CM

## 2023-12-17 DIAGNOSIS — N939 Abnormal uterine and vaginal bleeding, unspecified: Secondary | ICD-10-CM

## 2023-12-17 DIAGNOSIS — Z01419 Encounter for gynecological examination (general) (routine) without abnormal findings: Secondary | ICD-10-CM

## 2024-01-27 ENCOUNTER — Other Ambulatory Visit: Payer: Self-pay

## 2024-01-27 ENCOUNTER — Encounter (HOSPITAL_BASED_OUTPATIENT_CLINIC_OR_DEPARTMENT_OTHER): Payer: Self-pay

## 2024-01-27 ENCOUNTER — Emergency Department (HOSPITAL_BASED_OUTPATIENT_CLINIC_OR_DEPARTMENT_OTHER)
Admission: EM | Admit: 2024-01-27 | Discharge: 2024-01-27 | Disposition: A | Discharge status: Home / Self Care | Attending: Student | Admitting: Student

## 2024-01-27 DIAGNOSIS — R519 Headache, unspecified: Secondary | ICD-10-CM | POA: Diagnosis present

## 2024-01-27 NOTE — ED Triage Notes (Signed)
 Pt c/o HA, need doctor's note for work. Advises she usually takes tylenol  but didn't because she needs to drive. Usually uses OTC meds w relief

## 2024-01-27 NOTE — ED Provider Notes (Signed)
 [place] EMERGENCY DEPARTMENT AT [facility] Provider Note   CSN: [id number] Arrival date & time: 01/27/24  [id number]     Patient presents with: Headache and Letter for School/Work   [name] is a 38 y.o. female.   38 year old female presents the emergency room with request for a work note.  Patient states that she works third shift, was drinking yesterday and has not eaten today, has a generalized headache.  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  She denies photophobia, phonophobia, nausea or vomiting.  Reports gradual onset of headache, does not get headaches frequently and is not concerned about this headache.       Prior to Admission medications   Medication Sig Start Date End Date Taking? Authorizing Provider  albuterol  (VENTOLIN  HFA) 108 (90 Base) MCG/ACT inhaler Inhale 2 puffs into the lungs every 6 (six) hours as needed for wheezing or shortness of breath. Patient not taking: Reported on 11/21/2023 05/30/23   [name]  SLYND  4 MG TABS TAKE 1 TABLET BY MOUTH EVERY DAY 08/12/23   [name]  hydrochlorothiazide  (HYDRODIURIL ) 25 MG tablet Take 1 tablet (25 mg total) by mouth daily. Patient not taking: Reported on 11/05/2023 08/17/19 12/30/19  [name], Peggy, [name]    Allergies: Pollen extract    Review of Systems Negative except as per HPI Updated Vital Signs BP 102/64 (BP Location: Right Arm)   Pulse 95   Temp 98.8 F (37.1 C)   Resp 16   SpO2 99%   Physical Exam Vitals and nursing note reviewed.  Constitutional:      General: She is not in acute distress.    Appearance: She is well-developed. She is not diaphoretic.  HENT:     Head: Normocephalic and atraumatic.     Right Ear: Tympanic membrane and ear canal normal.     Left Ear: Tympanic membrane and ear canal normal.     Nose: Nose normal.      Mouth/Throat:     Mouth: Mucous membranes are moist.  Eyes:     General: No scleral icterus.    Extraocular Movements: Extraocular movements intact.     Pupils: Pupils are equal, round, and reactive to light. Pupils are equal.  Pulmonary:     Effort: Pulmonary effort is normal.  Musculoskeletal:     Cervical back: Normal range of motion and neck supple.  Neurological:     Mental Status: She is alert and oriented to person, place, and time.     GCS: GCS eye subscore is 4. GCS verbal subscore is 5. GCS motor subscore is 6.     Cranial Nerves: No cranial nerve deficit or facial asymmetry.     Sensory: No sensory deficit.     Motor: No weakness.     Gait: Gait normal.  Psychiatric:        Behavior: Behavior normal.     (all labs ordered are listed, but only abnormal results are displayed) Labs Reviewed - No data to display  EKG: None  Radiology: No results found.   Procedures   Medications Ordered in the ED - No data to display  Medical Decision Making  38 year old female with complaint of headache requesting note for work so she can rest tonight as she works third shift.  Exam is unremarkable.  Recommend Motrin  and Tylenol  as needed directed, home to rest and hydrate.     Final diagnoses:  Acute nonintractable headache, unspecified headache type    ED Discharge Orders     None          [name] 01/27/24 2023    Albertina Dixon, [name] 01/28/24 1737

## 2024-01-27 NOTE — Discharge Instructions (Signed)
 Home to rest and hydrate.  Take Motrin  and Tylenol  as needed as directed.  Follow-up with your primary care provider.

## 2024-01-28 ENCOUNTER — Telehealth: Payer: Self-pay

## 2024-01-28 NOTE — Transitions of Care (Post Inpatient/ED Visit) (Signed)
   01/28/2024  Name: [name] MRN: [id number] DOB: April 30, 1986  Today's TOC FU Call Status:   Patient's Name and Date of Birth confirmed.  Transition Care Management Follow-up Telephone Call Date of Discharge: 01/27/24 Discharge Facility: [facility] ([facility]) Type of Discharge: Emergency Department Reason for ED Visit: Other: How have you been since you were released from the hospital?: Better Any questions or concerns?: No  Items Reviewed: Did you receive and understand the discharge instructions provided?: No Medications obtained,verified, and reconciled?: No Any new allergies since your discharge?: No Dietary orders reviewed?: NA Do you have support at home?: Yes  Medications Reviewed Today: Medications Reviewed Today     Reviewed by Starlene Charlynn BIRCH, CMA (Certified Medical Assistant) on 01/28/24 at 1115  Med List Status: <None>   Medication Order Taking? Sig Documenting Provider Last Dose Status Informant  albuterol  (VENTOLIN  HFA) 108 (90 Base) MCG/ACT inhaler [id number]  Inhale 2 puffs into the lungs every 6 (six) hours as needed for wheezing or shortness of breath.  Patient not taking: Reported on 11/21/2023   Richad Jon HERO, NP  Active    Patient not taking:   Discontinued 12/30/19 2029   SLYND  4 MG TABS [id number] Yes TAKE 1 TABLET BY MOUTH EVERY DAY Fredirick Glenys RAMAN, MD  Active             Home Care and Equipment/Supplies: Were Home Health Services Ordered?: NA Any new equipment or medical supplies ordered?: NA  Functional Questionnaire: Do you need assistance with bathing/showering or dressing?: No Do you need assistance with meal preparation?: No Do you need assistance with eating?: No Do you have difficulty maintaining continence: No Do you need assistance with getting out of bed/getting out of a chair/moving?: No Do you have difficulty managing or taking your medications?: No  Follow up appointments reviewed: Specialist Hospital Follow-up  appointment confirmed?: NA Do you need transportation to your follow-up appointment?: No Do you understand care options if your condition(s) worsen?: Yes-patient verbalized understanding  SDOH Interventions Today    Flowsheet Row Most Recent Value  SDOH Interventions   Food Insecurity Interventions Intervention Not Indicated  Housing Interventions Intervention Not Indicated  Transportation Interventions Intervention Not Indicated    SIGNATURE Khrystal Jeanmarie, RMA

## 2024-02-21 ENCOUNTER — Telehealth: Payer: Self-pay | Admitting: Nurse Practitioner

## 2024-02-21 NOTE — Telephone Encounter (Signed)
 Called to schedule Dermatology appointment. LVM to call back.  Appointment Notes: Ref by [facility] (Hidradenitis Axillaris)   Thanks!

## 2024-03-24 ENCOUNTER — Encounter (HOSPITAL_COMMUNITY): Payer: Self-pay

## 2024-03-24 ENCOUNTER — Ambulatory Visit (HOSPITAL_COMMUNITY)
Admission: EM | Admit: 2024-03-24 | Discharge: 2024-03-24 | Disposition: A | Discharge status: Home / Self Care | Attending: Family Medicine | Admitting: Family Medicine

## 2024-03-24 DIAGNOSIS — R519 Headache, unspecified: Secondary | ICD-10-CM

## 2024-03-24 NOTE — ED Triage Notes (Signed)
 Patient states she has been having intermittent headaches x 2 weeks. Patient states she is needing a work note.  Patient also reports light sensitivity.  Patient has been taking Tylenol  PM and Excedrin.

## 2024-03-24 NOTE — Discharge Instructions (Signed)
 Continue taking Tylenol  as needed for your headache.

## 2024-03-25 NOTE — ED Provider Notes (Signed)
 [facility]   [id number] 03/24/24 Arrival Time: 1938  ASSESSMENT & PLAN:  1. Nonintractable episodic headache, unspecified headache type    Work note provided. Prefers OTC Tylenol .  Normal neurological exam. Without fever, focal neuro logical deficits, nuchal rigidity, or change in vision. No indication for neurodiagnostic workup at this time. Discussed.  Recommend:  Follow-up Information     Schedule an appointment as soon as possible for a visit  with Paseda, Folashade R, FNP.   Specialty: Nurse Practitioner Why: For follow up. Contact information: [address] [place], [place] [address] [phone]         [facility] Emergency Department at [facility].   Specialty: Emergency Medicine Why: If symptoms worsen in any way. Contact information: [address] [place]  [phone]                 Reviewed expectations re: course of current medical issues. Questions answered. Outlined signs and symptoms indicating need for more acute intervention. Patient verbalized understanding. After Visit Summary given.   SUBJECTIVE: History from: Patient Patient is able to give a clear and coherent history.  [name] is a 38 y.o. female who presents with complaint of a headache. H/O headaches but without dx of migraines. HA off/on past few days; throbbing. Not worst HA of life. Denies head trauma. Without n/v. Feels fatigued. Tylenol  helps HA. Needs work note. Denies extremity sensation changes or weakness.    OBJECTIVE:  Vitals:   03/24/24 2006  BP: 100/66  Pulse: 79  Resp: 16  Temp: (!) 97.5 F (36.4 C)  TempSrc: Oral  SpO2: 100%    General appearance: alert; NAD HENT: normocephalic; atraumatic Eyes: PERRLA; EOMI; conjunctivae normal Neck: supple with FROM Lungs: clear to auscultation bilaterally; unlabored respirations Heart: regular rate and rhythm Extremities: no edema;  symmetrical with no gross deformities Skin: warm and dry Neurologic: alert; speech is fluent and clear without dysarthria or aphasia; CN 2-12 grossly intact; no facial droop; normal gait; normal symmetric reflexes; normal extremity strength and sensation throughout; bilateral upper and lower extremity sensation is grossly intact with 5/5 symmetric strength Psychological: alert and cooperative; normal mood and affect  Labs:  Labs Reviewed - No data to display  Imaging: No results found.  Allergies  Allergen Reactions   Pollen Extract Hives    Past Medical History:  Diagnosis Date   Alpha thalassemia silent carrier 02/12/2019   Cervical high risk HPV (human papillomavirus) test positive 02/11/2019   Cytology normal  [ ]  co-test 01/2020     History of reversal of tubal ligation 09/2018   Tobacco use disorder 11/05/2023   Social History   Socioeconomic History   Marital status: Single    Spouse name: Not on file   Number of children: 5   Years of education: Not on file   Highest education level: Not on file  Occupational History   Not on file  Tobacco Use   Smoking status: Some Days    Current packs/day: 0.25    Types: Cigarettes   Smokeless tobacco: Never   Tobacco comments:    Pt states she hasn't smoked in a month  Vaping Use   Vaping status: Never Used  Substance and Sexual Activity   Alcohol use: No   Drug use: Not Currently    Types: Marijuana    Comment: no use x 4wks    Sexual activity: Yes  Other Topics Concern   Not on  file  Social History Narrative   Lives with her children   Social Drivers of Health   Financial Resource Strain: Not on file  Food Insecurity: No Food Insecurity (01/28/2024)   Hunger Vital Sign    Worried About Running Out of Food in the Last Year: Never true    Ran Out of Food in the Last Year: Never true  Transportation Needs: No Transportation Needs (01/28/2024)   PRAPARE - Administrator, Civil Service (Medical): No     Lack of Transportation (Non-Medical): No  Physical Activity: Not on file  Stress: Not on file  Social Connections: Not on file  Intimate Partner Violence: Not on file   Family History  Problem Relation Age of Onset   Healthy Mother    Healthy Father    Past Surgical History:  Procedure Laterality Date   TUBAL LIGATION  2008   Tubal reversal  09/2018      Rolinda Rogue, MD 03/25/24 1153

## 2024-06-05 ENCOUNTER — Ambulatory Visit (HOSPITAL_COMMUNITY)
Admission: EM | Admit: 2024-06-05 | Discharge: 2024-06-05 | Disposition: A | Discharge status: Home / Self Care | Attending: Physician Assistant | Admitting: Physician Assistant

## 2024-06-05 ENCOUNTER — Encounter (HOSPITAL_COMMUNITY): Payer: Self-pay

## 2024-06-05 DIAGNOSIS — L2084 Intrinsic (allergic) eczema: Secondary | ICD-10-CM

## 2024-06-05 DIAGNOSIS — L0201 Cutaneous abscess of face: Secondary | ICD-10-CM | POA: Diagnosis not present

## 2024-06-05 DIAGNOSIS — Z23 Encounter for immunization: Secondary | ICD-10-CM

## 2024-06-05 DIAGNOSIS — W57XXXA Bitten or stung by nonvenomous insect and other nonvenomous arthropods, initial encounter: Secondary | ICD-10-CM

## 2024-06-05 MED ORDER — HYDROCORTISONE 0.5 % EX CREA: 1.0000 | TOPICAL_CREAM | Freq: Two times a day (BID) | CUTANEOUS | 0 refills | Status: AC

## 2024-06-05 MED ORDER — DOXYCYCLINE HYCLATE 100 MG PO CAPS: 100.0000 mg | ORAL_CAPSULE | Freq: Two times a day (BID) | ORAL | 0 refills | Status: DC

## 2024-06-05 MED ORDER — TETANUS-DIPHTH-ACELL PERTUSSIS 5-2-15.5 LF-MCG/0.5 IM SUSP
0.5000 mL | Freq: Once | INTRAMUSCULAR | Status: AC
  Administered 2024-06-05: 0.5 mL via INTRAMUSCULAR

## 2024-06-05 MED ORDER — TETANUS-DIPHTH-ACELL PERTUSSIS 5-2-15.5 LF-MCG/0.5 IM SUSP
INTRAMUSCULAR | Status: AC
  Filled 2024-06-05: qty 0.5

## 2024-06-05 NOTE — Discharge Instructions (Signed)
 We updated your tetanus today.  Start doxycycline  100 mg twice daily for 10 days.  This will decrease the effectiveness of your birth control so use backup birth control on this medication until your next menstrual cycle.  Use a warm compress on the area.  Alternate Tylenol  and ibuprofen  for pain.  If this area becomes larger and has a soft center that it might have fluid underneath it may be worthwhile to return so we can consider draining it.  If anything worsens and it grows rapidly and you have difficulty swallowing, swelling of her throat, shortness of breath, high fever you should go to the emergency room.  I have called in hydrocortisone to help with your eczema.  Use hypoallergenic soaps and detergents.  If that spreads or worsens please return for reevaluation.

## 2024-06-05 NOTE — ED Triage Notes (Signed)
 Patient here today with c/o an insect bite on the left side of her face. Patient states that it started out as a small bump on her face on Tuesday or Wednesday and yesterday she had increased swelling and pain. Patient took Tylenol  with no relief.

## 2024-06-05 NOTE — ED Provider Notes (Signed)
 [facility]    CSN: [id number] Arrival date & time: 06/05/24  1933      History   Chief Complaint Chief Complaint  Patient presents with   Abscess    HPI [name] is a 38 y.o. female.   Patient presents today with a 3-day history of enlarging lesion on her left face anterior to the ear.  She does not remember something biting her and did not see any specific insect but believes that she was bit by something and this has become infected.  She reports that yesterday she was able to express some purulent drainage but the pain has increased and so she has not been able to express any additional drainage today.  She reports that the pain is rated 10 on a 0-10 pain scale, described as throbbing, worse with manipulation of the region, no alleviating factors identified.  She has tried Tylenol  without improvement of symptoms.  She denies any swelling of her throat, shortness of breath, muffled voice, fever, nausea, vomiting.  She is confident that she is not pregnant.  She denies history of MRSA or recurrent skin infections though she does have a history of hidradenitis suppurativa.  Denies any recent antibiotic use.  She is unsure when her last tetanus vaccine was.  At the end of the visit patient did request a refill of hydrocortisone cream to treat eczema on her face.  She reports pruritus in this region but has not had any associated pain.  She is not using any over-the-counter medication for symptom management.    Past Medical History:  Diagnosis Date   Alpha thalassemia silent carrier 02/12/2019   Cervical high risk HPV (human papillomavirus) test positive 02/11/2019   Cytology normal  [ ]  co-test 01/2020     History of reversal of tubal ligation 09/2018   Tobacco use disorder 11/05/2023    Patient Active Problem List   Diagnosis Date Noted   Tobacco use disorder 11/05/2023   Hidradenitis axillaris 11/05/2023   Screening for diabetes mellitus 11/05/2023    Encounter for examination following treatment at hospital 11/05/2023   Recent unexplained weight loss 10/10/2021   Breakthrough bleeding on Depo-Provera  10/10/2021   Indication for care in labor or delivery 12/01/2020   Short interval between pregnancies affecting pregnancy in first trimester, antepartum 05/24/2020   Alpha thalassemia silent carrier 02/12/2019   Cervical high risk HPV (human papillomavirus) test positive 02/11/2019   Supervision of high risk pregnancy, antepartum 01/14/2019   Dental abscess 03/31/2017    Past Surgical History:  Procedure Laterality Date   TUBAL LIGATION  2008   Tubal reversal  09/2018    OB History     Gravida  6   Para  5   Term  5   Preterm      AB  1   Living  5      SAB  1   IAB      Ectopic      Multiple  0   Live Births  5            Home Medications    Prior to Admission medications   Medication Sig Start Date End Date Taking? Authorizing Provider  doxycycline  (VIBRAMYCIN ) 100 MG capsule Take 1 capsule (100 mg total) by mouth 2 (two) times daily. 06/05/24  Yes Jamonta Goerner K, PA-C  hydrocortisone cream 0.5 % Apply 1 Application topically 2 (two) times daily. 06/05/24  Yes Verdean Murin K, PA-C  SLYND  4  MG TABS TAKE 1 TABLET BY MOUTH EVERY DAY 08/12/23   Fredirick Glenys RAMAN, MD  hydrochlorothiazide  (HYDRODIURIL ) 25 MG tablet Take 1 tablet (25 mg total) by mouth daily. Patient not taking: Reported on 11/05/2023 08/17/19 12/30/19  Constant, Peggy, MD    Family History Family History  Problem Relation Age of Onset   Healthy Mother    Healthy Father     Social History Social History   Tobacco Use   Smoking status: Some Days    Current packs/day: 0.25    Types: Cigarettes   Smokeless tobacco: Never   Tobacco comments:    Pt states she hasn't smoked in a month  Vaping Use   Vaping status: Never Used  Substance Use Topics   Alcohol use: No   Drug use: Not Currently    Types: Marijuana    Comment: no use x 4wks       Allergies   Pollen extract   Review of Systems Review of Systems  Constitutional:  Positive for activity change. Negative for appetite change, fatigue and fever.  HENT:  Positive for facial swelling. Negative for congestion, sore throat, trouble swallowing and voice change.   Respiratory:  Negative for shortness of breath.   Gastrointestinal:  Negative for diarrhea, nausea and vomiting.  Musculoskeletal:  Negative for arthralgias and myalgias.  Skin:  Positive for rash and wound. Negative for color change.  Neurological:  Negative for dizziness, light-headedness and headaches.     Physical Exam Triage Vital Signs ED Triage Vitals [06/05/24 2028]  Encounter Vitals Group     BP 91/61     Girls Systolic BP Percentile      Girls Diastolic BP Percentile      Boys Systolic BP Percentile      Boys Diastolic BP Percentile      Pulse Rate 82     Resp 16     Temp 98.4 F (36.9 C)     Temp Source Oral     SpO2 100 %     Weight      Height      Head Circumference      Peak Flow      Pain Score 10     Pain Loc      Pain Education      Exclude from Growth Chart    No data found.  Updated Vital Signs BP 91/61 (BP Location: Left Arm)   Pulse 82   Temp 98.4 F (36.9 C) (Oral)   Resp 16   LMP 05/21/2024 (Approximate)   SpO2 100%   Visual Acuity Right Eye Distance:   Left Eye Distance:   Bilateral Distance:    Right Eye Near:   Left Eye Near:    Bilateral Near:     Physical Exam Vitals reviewed.  Constitutional:      General: She is awake. She is not in acute distress.    Appearance: Normal appearance. She is well-developed. She is not ill-appearing.     Comments: Very pleasant female stated age in no acute distress sitting comfortably in exam room  HENT:     Head: Normocephalic and atraumatic.     Jaw: Swelling present.      Nose: Nose normal.     Mouth/Throat:     Pharynx: Uvula midline. No oropharyngeal exudate or posterior oropharyngeal erythema.   Cardiovascular:     Rate and Rhythm: Normal rate and regular rhythm.     Heart sounds: Normal heart sounds, S1 normal  and S2 normal. No murmur heard. Pulmonary:     Effort: Pulmonary effort is normal.     Breath sounds: Normal breath sounds. No wheezing, rhonchi or rales.     Comments: Clear to auscultation bilaterally Lymphadenopathy:     Head:     Right side of head: No submental, submandibular or tonsillar adenopathy.     Left side of head: No submental, submandibular or tonsillar adenopathy.     Cervical: No cervical adenopathy.  Skin:    Findings: Abscess and rash present. Rash is macular and papular.     Comments: Macular papular rash noted at the hairline with evidence of excoriation.  Psychiatric:        Behavior: Behavior is cooperative.      UC Treatments / Results  Labs (all labs ordered are listed, but only abnormal results are displayed) Labs Reviewed - No data to display  EKG   Radiology No results found.  Procedures Procedures (including critical care time)  Medications Ordered in UC Medications  Tdap (ADACEL) injection 0.5 mL (0.5 mLs Intramuscular Given 06/05/24 2048)    Initial Impression / Assessment and Plan / UC Course  I have reviewed the triage vital signs and the nursing notes.  Pertinent labs & imaging results that were available during my care of the patient were reviewed by me and considered in my medical decision making (see chart for details).     Patient is well-appearing, afebrile, nontoxic, nontachycardic.  She does have a lesion consistent with an abscess on her left mandible anterior to the ear so we will start doxycycline  100 mg twice daily for 10 days.  I&D was not attempted as there was no significant fluctuance.  We discussed that doxycycline  can decrease the effectiveness of birth control so she should use backup birth control while on this medication until her next menstrual cycle.  Recommended warm compresses and  over-the-counter analgesics for pain relief.  Due to concern for insect bite tetanus was updated as this has not been given in the past 10 years.  We discussed that if anything worsens or changes and patient develops swelling of her throat, shortness of breath, muffled voice, fever, rapidly enlarging lesion she needs to be seen emergently.  Strict return precautions given.  Work excuse note provided.  Rash at hairline is consistent with eczema.  Low potency hydrocortisone was sent to pharmacy to be used up to twice a day.  Also recommended hypoallergenic soaps and emollients.  If anything worsens or changes she is to return for reevaluation.  Final Clinical Impressions(s) / UC Diagnoses   Final diagnoses:  Abscess of face  Bug bite with infection, initial encounter  Intrinsic eczema     Discharge Instructions      We updated your tetanus today.  Start doxycycline  100 mg twice daily for 10 days.  This will decrease the effectiveness of your birth control so use backup birth control on this medication until your next menstrual cycle.  Use a warm compress on the area.  Alternate Tylenol  and ibuprofen  for pain.  If this area becomes larger and has a soft center that it might have fluid underneath it may be worthwhile to return so we can consider draining it.  If anything worsens and it grows rapidly and you have difficulty swallowing, swelling of her throat, shortness of breath, high fever you should go to the emergency room.  I have called in hydrocortisone to help with your eczema.  Use hypoallergenic soaps and detergents.  If that spreads or worsens please return for reevaluation.    ED Prescriptions     Medication Sig Dispense Auth. Provider   hydrocortisone cream 0.5 % Apply 1 Application topically 2 (two) times daily. 30 g Minnah Llamas K, PA-C   doxycycline  (VIBRAMYCIN ) 100 MG capsule Take 1 capsule (100 mg total) by mouth 2 (two) times daily. 20 capsule Vassie Kugel K, PA-C      PDMP  not reviewed this encounter.   Sherrell Rocky POUR, PA-C 06/05/24 2113

## 2024-07-03 ENCOUNTER — Other Ambulatory Visit: Payer: Self-pay

## 2024-07-03 ENCOUNTER — Ambulatory Visit (HOSPITAL_COMMUNITY)
Admission: EM | Admit: 2024-07-03 | Discharge: 2024-07-03 | Disposition: A | Discharge status: Home / Self Care | Attending: Internal Medicine | Admitting: Internal Medicine

## 2024-07-03 ENCOUNTER — Encounter (HOSPITAL_COMMUNITY): Payer: Self-pay | Admitting: *Deleted

## 2024-07-03 DIAGNOSIS — S0501XA Injury of conjunctiva and corneal abrasion without foreign body, right eye, initial encounter: Secondary | ICD-10-CM

## 2024-07-03 DIAGNOSIS — L03213 Periorbital cellulitis: Secondary | ICD-10-CM

## 2024-07-03 DIAGNOSIS — T3695XA Adverse effect of unspecified systemic antibiotic, initial encounter: Secondary | ICD-10-CM | POA: Diagnosis not present

## 2024-07-03 DIAGNOSIS — B379 Candidiasis, unspecified: Secondary | ICD-10-CM

## 2024-07-03 DIAGNOSIS — N611 Abscess of the breast and nipple: Secondary | ICD-10-CM | POA: Diagnosis not present

## 2024-07-03 MED ORDER — AMOXICILLIN-POT CLAVULANATE 875-125 MG PO TABS: 1.0000 | ORAL_TABLET | Freq: Two times a day (BID) | ORAL | 0 refills | Status: AC

## 2024-07-03 MED ORDER — FLUCONAZOLE 150 MG PO TABS: 150.0000 mg | ORAL_TABLET | ORAL | 0 refills | Status: AC

## 2024-07-03 MED ORDER — DOXYCYCLINE HYCLATE 100 MG PO CAPS: 100.0000 mg | ORAL_CAPSULE | Freq: Two times a day (BID) | ORAL | 0 refills | Status: AC

## 2024-07-03 MED FILL — Fluorescein Sodium Ophth Strips 1 MG: OPHTHALMIC | Qty: 1 | Status: AC

## 2024-07-03 MED FILL — Tetracaine HCl Ophth Soln 0.5%: OPHTHALMIC | Qty: 4 | Status: AC

## 2024-07-03 NOTE — Discharge Instructions (Addendum)
 Your abscess has been evaluated in the clinic and appears to be infected, but is not quite ready for drainage.   I would like for you to perform warm compresses to the area frequently and continue taking over the counter medications for pain and inflammation as directed.   Start taking antibiotic sent to pharmacy as directed. This will help reduce infection to area and help the abscess mature/drain.   If abscess does not begin to drain on its own over the next few days and becomes softer, please return to urgent care for this to be drained appropriately.   If you have any fever, chills, nausea, vomiting, or worsening pain/swelling to the area, please return to urgent care for reevaluation.   Your eye discomfort is due to corneal irritation/abrasion.  This is a scratch to the protective film of your eye.  Use warm compresses frequently with clean wash cloth. Apply thin ribbon of erythromycin eye ointment to the inside of the lower eyelid twice daily. After applying ointment, close eyes and roll eyes around to coat the eye. This may result in blurry vision to the affected eye for 30-60 minutes, so plan accordingly if you need to drive after applying ointment.  Apply ointment twice a day for 7 days.  Follow-up with PCP or eye doctor.  If you develop any new or worsening symptoms or if your symptoms do not start to improve, pleases return here or follow-up with your primary care provider. If your symptoms are severe, please go to the emergency room.

## 2024-07-03 NOTE — ED Triage Notes (Signed)
 PT reports Rt eye drainage just after she had her lashes done 3-4 days ago.  Pt found some thing on her Lt breast sometime last week LT breast on top. Not nearnipple.

## 2024-07-03 NOTE — ED Provider Notes (Signed)
 [facility]    CSN: [id number] Arrival date & time: 07/03/24  1717      History   Chief Complaint Chief Complaint  Patient presents with   Breast Mass   Eye Drainage    HPI [name] is a 38 y.o. female.   [name] is a 38 y.o. female presenting for chief complaint of right eye drainage, redness, swelling, and pain that started 3 to 4 days ago after she had her eyelashes done.  She took off her eyelashes due to the symptoms she was experiencing and has had symptoms ever since.  Drainage is crusty, yellow/white, and watery.  She reports photophobia.  She does not wear contacts or glasses.  Denies vision changes, fever/chills, head pain, rhinorrhea, and rash around the eye.  Denies recent antibiotic or steroid use.  Additionally states she noticed left breast mass 1 week ago that has grown in size and pain over the last week.  She does not remember any recent trauma/injuries to the left breast.  She states the area of concern is to the superior left nipple and started out as a pimple but has now become swollen, red, and with purulent drainage after she tried to pop the pimple. Denies recent bite injuries to the area or scratching the area. Denies recent fever, chills, nausea, vomiting, other breast lumps to palpation of the breast, nipple discharge, axillary pain, and malaise. No recent weight loss without trying or night sweats. She has never had a mammogram before.  Last normal menstrual cycle started on June 29, 2024.  Denies chance of pregnancy.     Past Medical History:  Diagnosis Date   Alpha thalassemia silent carrier 02/12/2019   Cervical high risk HPV (human papillomavirus) test positive 02/11/2019   Cytology normal  [ ]  co-test 01/2020     History of reversal of tubal ligation 09/2018   Tobacco use disorder 11/05/2023    Patient Active Problem List   Diagnosis Date Noted   Tobacco use disorder 11/05/2023   Hidradenitis axillaris 11/05/2023    Screening for diabetes mellitus 11/05/2023   Encounter for examination following treatment at hospital 11/05/2023   Recent unexplained weight loss 10/10/2021   Breakthrough bleeding on Depo-Provera  10/10/2021   Indication for care in labor or delivery 12/01/2020   Short interval between pregnancies affecting pregnancy in first trimester, antepartum 05/24/2020   Alpha thalassemia silent carrier 02/12/2019   Cervical high risk HPV (human papillomavirus) test positive 02/11/2019   Supervision of high risk pregnancy, antepartum 01/14/2019   Dental abscess 03/31/2017    Past Surgical History:  Procedure Laterality Date   TUBAL LIGATION  2008   Tubal reversal  09/2018    OB History     Gravida  6   Para  5   Term  5   Preterm      AB  1   Living  5      SAB  1   IAB      Ectopic      Multiple  0   Live Births  5            Home Medications    Prior to Admission medications  Medication Sig Start Date End Date Taking? Authorizing Provider  amoxicillin -clavulanate (AUGMENTIN ) 875-125 MG tablet Take 1 tablet by mouth every 12 (twelve) hours. 07/03/24  Yes Enedelia Dorna HERO, FNP  doxycycline  (VIBRAMYCIN ) 100 MG capsule Take 1 capsule (100 mg total) by mouth 2 (two) times  daily for 7 days. 07/03/24 07/10/24 Yes Enedelia Dorna HERO, FNP  fluconazole  (DIFLUCAN ) 150 MG tablet Take 1 tablet (150 mg total) by mouth every 3 (three) days. 07/03/24  Yes Enedelia Dorna HERO, FNP  SLYND  4 MG TABS TAKE 1 TABLET BY MOUTH EVERY DAY 08/12/23  Yes Fredirick Glenys RAMAN, MD  hydrocortisone  cream 0.5 % Apply 1 Application topically 2 (two) times daily. 06/05/24   Raspet, Erin K, PA-C  hydrochlorothiazide  (HYDRODIURIL ) 25 MG tablet Take 1 tablet (25 mg total) by mouth daily. Patient not taking: Reported on 11/05/2023 08/17/19 12/30/19  Constant, Peggy, MD    Family History Family History  Problem Relation Age of Onset   Healthy Mother    Healthy Father     Social  History Social History[1]   Allergies   Pollen extract   Review of Systems Review of Systems Per HPI  Physical Exam Triage Vital Signs ED Triage Vitals  Encounter Vitals Group     BP 07/03/24 1730 107/72     Girls Systolic BP Percentile --      Girls Diastolic BP Percentile --      Boys Systolic BP Percentile --      Boys Diastolic BP Percentile --      Pulse Rate 07/03/24 1730 81     Resp 07/03/24 1730 18     Temp 07/03/24 1730 99.2 F (37.3 C)     Temp src --      SpO2 07/03/24 1730 100 %     Weight --      Height --      Head Circumference --      Peak Flow --      Pain Score 07/03/24 1727 7     Pain Loc --      Pain Education --      Exclude from Growth Chart --    No data found.  Updated Vital Signs BP 107/72   Pulse 81   Temp 99.2 F (37.3 C)   Resp 18   LMP 06/03/2024 (Approximate)   SpO2 100%   Visual Acuity Right Eye Distance:   Left Eye Distance:   Bilateral Distance:    Right Eye Near:   Left Eye Near:    Bilateral Near:     Physical Exam Vitals and nursing note reviewed. Exam conducted with a chaperone present Fain, RN present for breast exam).  Constitutional:      Appearance: She is not ill-appearing or toxic-appearing.  HENT:     Head: Normocephalic and atraumatic.     Jaw: There is normal jaw occlusion.     Right Ear: Hearing and external ear normal.     Left Ear: Hearing and external ear normal.     Nose: Nose normal.     Mouth/Throat:     Lips: Pink.  Eyes:     General: Lids are normal. Lids are everted, no foreign bodies appreciated. Vision grossly intact. Gaze aligned appropriately. No visual field deficit or scleral icterus.       Right eye: Discharge (Watery and mucopurulent drainage) and hordeolum (Lower medial hordeolum, fluctuant, tender, erythematous.) present. No foreign body.     Extraocular Movements: Extraocular movements intact.     Conjunctiva/sclera:     Right eye: Right conjunctiva is injected (Minimally).  No chemosis, exudate or hemorrhage.    Pupils: Pupils are equal, round, and reactive to light.     Right eye: Corneal abrasion and fluorescein uptake present. Seidel exam negative.  Visual Fields: Right eye visual fields normal and left eye visual fields normal.     Comments: Erythema and swelling to the surrounding tissue of the right eye with right medial lower eyelid hordeolum. Fluorescein stain performed to the right eye revealing right 5:00 corneal abrasion of right eye.  Patient tolerated procedure well.  I flushed with normal saline after fluorescein stain.  Pulmonary:     Effort: Pulmonary effort is normal.  Chest:     Chest wall: Swelling and tenderness present. No mass, lacerations, deformity, crepitus or edema. There is no dullness to percussion.  Breasts:    Left: Tenderness present. No swelling, bleeding, inverted nipple, mass, nipple discharge or skin change.    Musculoskeletal:     Cervical back: Neck supple.  Lymphadenopathy:     Cervical: No cervical adenopathy.  Skin:    General: Skin is warm and dry.     Capillary Refill: Capillary refill takes less than 2 seconds.     Findings: No rash.  Neurological:     General: No focal deficit present.     Mental Status: She is alert and oriented to person, place, and time. Mental status is at baseline.     Cranial Nerves: No dysarthria or facial asymmetry.  Psychiatric:        Mood and Affect: Mood normal.        Speech: Speech normal.        Behavior: Behavior normal.        Thought Content: Thought content normal.        Judgment: Judgment normal.      UC Treatments / Results  Labs (all labs ordered are listed, but only abnormal results are displayed) Labs Reviewed - No data to display  EKG   Radiology No results found.  Procedures Procedures (including critical care time)  Medications Ordered in UC Medications - No data to display  Initial Impression / Assessment and Plan / UC Course  I have  reviewed the triage vital signs and the nursing notes.  Pertinent labs & imaging results that were available during my care of the patient were reviewed by me and considered in my medical decision making (see chart for details).   1.  Abrasion of right cornea and preseptal cellulitis of right eye Corneal abrasion on exam with fluorescein stain.  Preseptal cellulitis on exam.  No signs of ocular emergency such as orbital cellulitis/globe rupture.  Visual acuity intact.  Erythromycin eye ointment BID for 7 days. Augmentin  BID for 7 days.  Warm compresses.  Walking referral to ophthalmology given for follow-up PRN for new/worsening symptoms.    2. Left breast abscess Abscess appears infected, though is immature and is not ready for drainage.  Doxycycline  antibiotic ordered, discussed supportive care and wound care as outlined in AVS. Return for I&D if abscess fails to drain on its own in the next 2-3 days with use of antibiotic and supportive care.  Diflucan  has been ordered per patient request due to history of yeast vaginitis after antibiotic use. Low suspicion for malignancy/underlying mass. Advised to perform routine breast exams to check for masses/skin abnormality/asymmetry.  Counseled patient on potential for adverse effects with medications prescribed/recommended today, strict ER and return-to-clinic precautions discussed, patient verbalized understanding.    Final Clinical Impressions(s) / UC Diagnoses   Final diagnoses:  Left breast abscess  Abrasion of right cornea, initial encounter  Preseptal cellulitis of right eye  Antibiotic-induced yeast infection     Discharge Instructions  Your abscess has been evaluated in the clinic and appears to be infected, but is not quite ready for drainage.   I would like for you to perform warm compresses to the area frequently and continue taking over the counter medications for pain and inflammation as directed.   Start taking  antibiotic sent to pharmacy as directed. This will help reduce infection to area and help the abscess mature/drain.   If abscess does not begin to drain on its own over the next few days and becomes softer, please return to urgent care for this to be drained appropriately.   If you have any fever, chills, nausea, vomiting, or worsening pain/swelling to the area, please return to urgent care for reevaluation.   Your eye discomfort is due to corneal irritation/abrasion.  This is a scratch to the protective film of your eye.  Use warm compresses frequently with clean wash cloth. Apply thin ribbon of erythromycin eye ointment to the inside of the lower eyelid twice daily. After applying ointment, close eyes and roll eyes around to coat the eye. This may result in blurry vision to the affected eye for 30-60 minutes, so plan accordingly if you need to drive after applying ointment.  Apply ointment twice a day for 7 days.  Follow-up with PCP or eye doctor.  If you develop any new or worsening symptoms or if your symptoms do not start to improve, pleases return here or follow-up with your primary care provider. If your symptoms are severe, please go to the emergency room.     ED Prescriptions     Medication Sig Dispense Auth. Provider   amoxicillin -clavulanate (AUGMENTIN ) 875-125 MG tablet Take 1 tablet by mouth every 12 (twelve) hours. 14 tablet Enedelia Dorna HERO, FNP   doxycycline  (VIBRAMYCIN ) 100 MG capsule Take 1 capsule (100 mg total) by mouth 2 (two) times daily for 7 days. 14 capsule Blakeleigh Domek M, FNP   fluconazole  (DIFLUCAN ) 150 MG tablet Take 1 tablet (150 mg total) by mouth every 3 (three) days. 2 tablet Enedelia Dorna HERO, FNP      PDMP not reviewed this encounter.      [1]  Social History Tobacco Use   Smoking status: Some Days    Current packs/day: 0.25    Types: Cigarettes   Smokeless tobacco: Never   Tobacco comments:    Pt states she hasn't smoked in  a month  Vaping Use   Vaping status: Never Used  Substance Use Topics   Alcohol use: No   Drug use: Not Currently    Types: Marijuana    Comment: no use x 4wks      Enedelia Dorna HERO, [place] 07/03/24 1924

## 2024-07-04 ENCOUNTER — Telehealth (HOSPITAL_COMMUNITY): Payer: Self-pay | Admitting: Nurse Practitioner

## 2024-07-04 MED ORDER — ERYTHROMYCIN 5 MG/GM OP OINT: TOPICAL_OINTMENT | OPHTHALMIC | 0 refills | Status: AC

## 2024-07-04 NOTE — Telephone Encounter (Signed)
 Patient called and spoke with nursing regarding an eye drop that was expected to be sent to the pharmacy. Upon review of the visit note dated 07/03/24, the provider documented that erythromycin  ophthalmic ointment would be prescribed, but the prescription was not sent at that time. The prescription has now been sent to the patients preferred pharmacy with appropriate instructions and duration as per the providers plan.

## 2024-07-20 ENCOUNTER — Other Ambulatory Visit: Payer: Self-pay

## 2024-07-20 MED ORDER — SLYND 4 MG PO TABS: 1.0000 | ORAL_TABLET | Freq: Every day | ORAL | 0 refills | Status: AC

## 2024-07-20 NOTE — Progress Notes (Signed)
 BC refill sent per protocol, pt is scheduling f/u appt

## 2024-08-11 ENCOUNTER — Ambulatory Visit: Payer: Self-pay | Admitting: Advanced Practice Midwife

## 2024-08-13 ENCOUNTER — Ambulatory Visit: Admitting: Obstetrics and Gynecology

## 2024-08-20 ENCOUNTER — Other Ambulatory Visit (HOSPITAL_COMMUNITY)
Admission: RE | Admit: 2024-08-20 | Discharge: 2024-08-20 | Disposition: A | Discharge status: Home / Self Care | Source: Ambulatory Visit | Attending: Obstetrics and Gynecology | Admitting: Obstetrics and Gynecology

## 2024-08-20 ENCOUNTER — Other Ambulatory Visit (HOSPITAL_COMMUNITY)
Admission: RE | Admit: 2024-08-20 | Discharge: 2024-08-20 | Disposition: A | Source: Ambulatory Visit | Attending: Obstetrics and Gynecology | Admitting: Obstetrics and Gynecology

## 2024-08-20 ENCOUNTER — Ambulatory Visit: Admitting: Obstetrics and Gynecology

## 2024-08-20 ENCOUNTER — Encounter: Payer: Self-pay | Admitting: Obstetrics and Gynecology

## 2024-08-20 VITALS — BP 110/70 | HR 73 | Ht 63.0 in | Wt 122.0 lb

## 2024-08-20 DIAGNOSIS — Z3202 Encounter for pregnancy test, result negative: Secondary | ICD-10-CM

## 2024-08-20 DIAGNOSIS — Z23 Encounter for immunization: Secondary | ICD-10-CM

## 2024-08-20 DIAGNOSIS — R87810 Cervical high risk human papillomavirus (HPV) DNA test positive: Secondary | ICD-10-CM | POA: Insufficient documentation

## 2024-08-20 DIAGNOSIS — Z01812 Encounter for preprocedural laboratory examination: Secondary | ICD-10-CM

## 2024-08-20 LAB — POCT URINE PREGNANCY: Preg Test, Ur: NEGATIVE

## 2024-08-20 NOTE — Progress Notes (Addendum)
 39 y.o. GYN presents for COLPO.   HIGH RISK HPV ([place]): Positive Abnormal  YPV 16 ([place]): Negative HPV 18 / 45 ([place]): Negative ADEQUACY: Satisfactory for evaluation; transformation zone component PRESENT. DIAGNOSIS: - Atypical squamous cells of undetermined significance (ASC-US ) Abnormal    UPT Negative  Gardasil Injection given in LUOG, tolerated well.  2nd Injection in 2 months and 3rd final Injection in 6 months from today.

## 2024-08-20 NOTE — Progress Notes (Signed)
" ° ° °  GYNECOLOGY CLINIC COLPOSCOPY PROCEDURE NOTE  39 y.o. [name] here for colposcopy for pap finding of:  Result Date Procedure Results Follow-ups  10/10/2021 Cytology - PAP High risk HPV: Positive (A) HPV 16: Negative HPV 18 / 45: Negative Adequacy: Satisfactory for evaluation; transformation zone component PRESENT. Diagnosis: - Atypical squamous cells of undetermined significance (ASC-US ) (A) Comment: Normal Reference Range HPV - Negative Comment: Normal Reference Range HPV 16 18 45 -Negative   01/30/2019 Cytology - PAP Adequacy: Satisfactory for evaluation  endocervical/transformation zone component PRESENT. Diagnosis: NEGATIVE FOR INTRAEPITHELIAL LESIONS OR MALIGNANCY. HPV: DETECTED (A) HPV 16/18/45 genotyping: NEGATIVE for HPV 16 & 18/45 Material Submitted: CervicoVaginal Pap [ThinPrep Imaged] CYTOLOGY - PAP: PAP RESULT     Discussed role for HPV in cervical dysplasia, need for surveillance, nature of the procedure, and risks and benefits.  Pregnancy test: Lab Results  Component Value Date   PREGTESTUR NEGATIVE 10/23/2022    Allergies[1]  Patient given informed consent, signed copy in the chart, time out was performed.    Placed in lithotomy position. Cervix viewed with speculum and colposcope after application of acetic acid.   Colposcopy Adequacy Cervix fully visualized: Yes  SCJ fully visualized: Yes  Colposcopy Findings no visible lesions, no mosaicism, no punctation, no abnormal vasculature, and acetowhite lesion(s) noted at 6 o'clock  Corresponding biopsies were obtained.    ECC specimen was obtained.  All specimens were labeled and sent to pathology.  Hemostatic measures: Monsel's solution  Complications: none  Patient tolerated the procedure well.  OBGyn Exam GENERAL: Well-developed, well-nourished female in no acute distress.  PELVIC: Normal external female genitalia. Vagina is pink and rugated.  Normal discharge. Normal appearing cervix.  Chaperone present during the pelvic exam EXTREMITIES: No cyanosis, clubbing, or edema, 2+ distal pulses.  Colposcopy Impressions Low grade features  Plan Patient admits to poor compliance with GYN exams. Last abnormal pap smear in 2023 and colposcopy was recommended Repeat pap smear and colposopcy done today Treatment plan pending biopsy results.  Patient was given post procedure instructions.  Will follow up pathology and manage accordingly; patient will be contacted with results and recommendations.  Routine preventative health maintenance measures emphasized. Discussed benefits of Gardasil vaccine and patient agrees to start series  Winton Felt, MD     [1]  Allergies Allergen Reactions   Pollen Extract Hives   "

## 2024-08-21 LAB — SURGICAL PATHOLOGY

## 2024-08-25 ENCOUNTER — Ambulatory Visit: Payer: Self-pay | Admitting: Obstetrics and Gynecology

## 2024-08-25 LAB — CYTOLOGY - PAP
Comment: NEGATIVE
Diagnosis: UNDETERMINED — AB
High risk HPV: NEGATIVE

## 2024-10-19 ENCOUNTER — Ambulatory Visit: Payer: Self-pay
